# Patient Record
Sex: Female | Born: 1945
Health system: Southern US, Community
[De-identification: ages and names within clinical notes are randomized; demographics above are authoritative.]

## PROBLEM LIST (undated history)

## (undated) DIAGNOSIS — Z8041 Family history of malignant neoplasm of ovary: Secondary | ICD-10-CM

## (undated) DIAGNOSIS — Z9109 Other allergy status, other than to drugs and biological substances: Secondary | ICD-10-CM

## (undated) DIAGNOSIS — K219 Gastro-esophageal reflux disease without esophagitis: Secondary | ICD-10-CM

## (undated) DIAGNOSIS — I639 Cerebral infarction, unspecified: Secondary | ICD-10-CM

## (undated) DIAGNOSIS — J302 Other seasonal allergic rhinitis: Secondary | ICD-10-CM

## (undated) DIAGNOSIS — J45909 Unspecified asthma, uncomplicated: Secondary | ICD-10-CM

## (undated) DIAGNOSIS — R531 Weakness: Secondary | ICD-10-CM

## (undated) DIAGNOSIS — M1990 Unspecified osteoarthritis, unspecified site: Secondary | ICD-10-CM

## (undated) DIAGNOSIS — Z803 Family history of malignant neoplasm of breast: Secondary | ICD-10-CM

## (undated) DIAGNOSIS — R011 Cardiac murmur, unspecified: Secondary | ICD-10-CM

## (undated) DIAGNOSIS — S8990XA Unspecified injury of unspecified lower leg, initial encounter: Secondary | ICD-10-CM

## (undated) DIAGNOSIS — IMO0002 Reserved for concepts with insufficient information to code with codable children: Secondary | ICD-10-CM

## (undated) DIAGNOSIS — K259 Gastric ulcer, unspecified as acute or chronic, without hemorrhage or perforation: Secondary | ICD-10-CM

## (undated) DIAGNOSIS — I10 Essential (primary) hypertension: Secondary | ICD-10-CM

## (undated) DIAGNOSIS — H269 Unspecified cataract: Secondary | ICD-10-CM

## (undated) DIAGNOSIS — Z923 Personal history of irradiation: Secondary | ICD-10-CM

## (undated) DIAGNOSIS — G43909 Migraine, unspecified, not intractable, without status migrainosus: Secondary | ICD-10-CM

## (undated) DIAGNOSIS — F32A Depression, unspecified: Secondary | ICD-10-CM

## (undated) DIAGNOSIS — A809 Acute poliomyelitis, unspecified: Secondary | ICD-10-CM

## (undated) DIAGNOSIS — J189 Pneumonia, unspecified organism: Secondary | ICD-10-CM

## (undated) DIAGNOSIS — C50919 Malignant neoplasm of unspecified site of unspecified female breast: Secondary | ICD-10-CM

## (undated) DIAGNOSIS — F329 Major depressive disorder, single episode, unspecified: Secondary | ICD-10-CM

## (undated) DIAGNOSIS — E669 Obesity, unspecified: Secondary | ICD-10-CM

## (undated) DIAGNOSIS — J3089 Other allergic rhinitis: Secondary | ICD-10-CM

## (undated) DIAGNOSIS — E785 Hyperlipidemia, unspecified: Secondary | ICD-10-CM

## (undated) DIAGNOSIS — D649 Anemia, unspecified: Secondary | ICD-10-CM

## (undated) HISTORY — DX: Other allergic rhinitis: J30.89

## (undated) HISTORY — DX: Acute poliomyelitis, unspecified: A80.9

## (undated) HISTORY — PX: UPPER GASTROINTESTINAL ENDOSCOPY: SHX188

## (undated) HISTORY — PX: TONSILLECTOMY: SUR1361

## (undated) HISTORY — DX: Unspecified injury of unspecified lower leg, initial encounter: S89.90XA

## (undated) HISTORY — DX: Unspecified cataract: H26.9

## (undated) HISTORY — DX: Migraine, unspecified, not intractable, without status migrainosus: G43.909

## (undated) HISTORY — DX: Obesity, unspecified: E66.9

## (undated) HISTORY — DX: Hyperlipidemia, unspecified: E78.5

## (undated) HISTORY — DX: Major depressive disorder, single episode, unspecified: F32.9

## (undated) HISTORY — DX: Cerebral infarction, unspecified: I63.9

## (undated) HISTORY — PX: BREAST BIOPSY: SHX20

## (undated) HISTORY — DX: Malignant neoplasm of unspecified site of unspecified female breast: C50.919

## (undated) HISTORY — PX: WISDOM TOOTH EXTRACTION: SHX21

## (undated) HISTORY — DX: Reserved for concepts with insufficient information to code with codable children: IMO0002

## (undated) HISTORY — DX: Gastro-esophageal reflux disease without esophagitis: K21.9

## (undated) HISTORY — PX: SKIN CANCER DESTRUCTION: SHX778

## (undated) HISTORY — DX: Anemia, unspecified: D64.9

## (undated) HISTORY — DX: Gastric ulcer, unspecified as acute or chronic, without hemorrhage or perforation: K25.9

## (undated) HISTORY — DX: Depression, unspecified: F32.A

## (undated) HISTORY — DX: Family history of malignant neoplasm of ovary: Z80.41

## (undated) HISTORY — DX: Family history of malignant neoplasm of breast: Z80.3

## (undated) HISTORY — PX: CATARACT EXTRACTION, BILATERAL: SHX1313

## (undated) HISTORY — PX: COLONOSCOPY: SHX174

## (undated) HISTORY — DX: Other seasonal allergic rhinitis: J30.2

---

## 1974-01-07 HISTORY — PX: TUBAL LIGATION: SHX77

## 1997-08-19 ENCOUNTER — Ambulatory Visit (HOSPITAL_COMMUNITY): Admission: RE | Admit: 1997-08-19 | Discharge: 1997-08-19 | Payer: Self-pay | Admitting: Family Medicine

## 1998-05-18 ENCOUNTER — Other Ambulatory Visit: Admission: RE | Admit: 1998-05-18 | Discharge: 1998-05-18 | Payer: Self-pay | Admitting: Obstetrics and Gynecology

## 1998-08-22 ENCOUNTER — Ambulatory Visit (HOSPITAL_COMMUNITY): Admission: RE | Admit: 1998-08-22 | Discharge: 1998-08-22 | Payer: Self-pay | Admitting: Family Medicine

## 1998-08-22 ENCOUNTER — Encounter: Payer: Self-pay | Admitting: Family Medicine

## 1999-07-10 ENCOUNTER — Other Ambulatory Visit: Admission: RE | Admit: 1999-07-10 | Discharge: 1999-07-10 | Payer: Self-pay | Admitting: Obstetrics and Gynecology

## 2000-09-18 ENCOUNTER — Other Ambulatory Visit: Admission: RE | Admit: 2000-09-18 | Discharge: 2000-09-18 | Payer: Self-pay | Admitting: Obstetrics and Gynecology

## 2000-09-19 ENCOUNTER — Encounter: Payer: Self-pay | Admitting: Family Medicine

## 2000-09-19 ENCOUNTER — Ambulatory Visit (HOSPITAL_COMMUNITY): Admission: RE | Admit: 2000-09-19 | Discharge: 2000-09-19 | Payer: Self-pay | Admitting: Family Medicine

## 2001-06-04 ENCOUNTER — Encounter: Payer: Self-pay | Admitting: Family Medicine

## 2001-06-04 ENCOUNTER — Ambulatory Visit (HOSPITAL_COMMUNITY): Admission: RE | Admit: 2001-06-04 | Discharge: 2001-06-04 | Payer: Self-pay | Admitting: Family Medicine

## 2001-07-06 ENCOUNTER — Encounter: Admission: RE | Admit: 2001-07-06 | Discharge: 2001-07-06 | Payer: Self-pay | Admitting: Family Medicine

## 2001-07-06 ENCOUNTER — Encounter: Payer: Self-pay | Admitting: Family Medicine

## 2001-11-12 ENCOUNTER — Other Ambulatory Visit: Admission: RE | Admit: 2001-11-12 | Discharge: 2001-11-12 | Payer: Self-pay | Admitting: Obstetrics and Gynecology

## 2002-04-14 ENCOUNTER — Ambulatory Visit (HOSPITAL_COMMUNITY): Admission: RE | Admit: 2002-04-14 | Discharge: 2002-04-14 | Payer: Self-pay | Admitting: Family Medicine

## 2002-04-14 ENCOUNTER — Encounter: Payer: Self-pay | Admitting: Family Medicine

## 2002-12-30 ENCOUNTER — Other Ambulatory Visit: Admission: RE | Admit: 2002-12-30 | Discharge: 2002-12-30 | Payer: Self-pay | Admitting: Obstetrics and Gynecology

## 2003-01-08 HISTORY — PX: NISSEN FUNDOPLICATION: SHX2091

## 2003-01-11 ENCOUNTER — Ambulatory Visit (HOSPITAL_COMMUNITY): Admission: RE | Admit: 2003-01-11 | Discharge: 2003-01-11 | Payer: Self-pay | Admitting: Internal Medicine

## 2003-03-29 ENCOUNTER — Ambulatory Visit (HOSPITAL_COMMUNITY): Admission: RE | Admit: 2003-03-29 | Discharge: 2003-03-29 | Payer: Self-pay | Admitting: Gastroenterology

## 2003-06-28 ENCOUNTER — Inpatient Hospital Stay (HOSPITAL_COMMUNITY): Admission: RE | Admit: 2003-06-28 | Discharge: 2003-07-01 | Payer: Self-pay | Admitting: Surgery

## 2003-08-18 ENCOUNTER — Ambulatory Visit (HOSPITAL_COMMUNITY): Admission: RE | Admit: 2003-08-18 | Discharge: 2003-08-18 | Payer: Self-pay | Admitting: Family Medicine

## 2004-02-10 ENCOUNTER — Ambulatory Visit: Payer: Self-pay | Admitting: Gastroenterology

## 2004-02-20 ENCOUNTER — Other Ambulatory Visit: Admission: RE | Admit: 2004-02-20 | Discharge: 2004-02-20 | Payer: Self-pay | Admitting: Obstetrics and Gynecology

## 2004-02-24 ENCOUNTER — Ambulatory Visit: Payer: Self-pay | Admitting: Gastroenterology

## 2004-08-21 ENCOUNTER — Ambulatory Visit (HOSPITAL_COMMUNITY): Admission: RE | Admit: 2004-08-21 | Discharge: 2004-08-21 | Payer: Self-pay | Admitting: Obstetrics and Gynecology

## 2005-08-22 ENCOUNTER — Ambulatory Visit (HOSPITAL_COMMUNITY): Admission: RE | Admit: 2005-08-22 | Discharge: 2005-08-22 | Payer: Self-pay | Admitting: Internal Medicine

## 2005-09-12 ENCOUNTER — Other Ambulatory Visit: Admission: RE | Admit: 2005-09-12 | Discharge: 2005-09-12 | Payer: Self-pay | Admitting: Obstetrics & Gynecology

## 2005-11-13 ENCOUNTER — Ambulatory Visit: Payer: Self-pay | Admitting: Internal Medicine

## 2006-08-25 ENCOUNTER — Ambulatory Visit (HOSPITAL_COMMUNITY): Admission: RE | Admit: 2006-08-25 | Discharge: 2006-08-25 | Payer: Self-pay | Admitting: Internal Medicine

## 2006-09-19 ENCOUNTER — Other Ambulatory Visit: Admission: RE | Admit: 2006-09-19 | Discharge: 2006-09-19 | Payer: Self-pay | Admitting: Obstetrics and Gynecology

## 2007-01-08 HISTORY — PX: BREAST LUMPECTOMY: SHX2

## 2007-08-26 ENCOUNTER — Ambulatory Visit (HOSPITAL_COMMUNITY): Admission: RE | Admit: 2007-08-26 | Discharge: 2007-08-26 | Payer: Self-pay | Admitting: Internal Medicine

## 2007-09-03 ENCOUNTER — Encounter (INDEPENDENT_AMBULATORY_CARE_PROVIDER_SITE_OTHER): Payer: Self-pay | Admitting: Diagnostic Radiology

## 2007-09-03 ENCOUNTER — Encounter: Admission: RE | Admit: 2007-09-03 | Discharge: 2007-09-03 | Payer: Self-pay | Admitting: Internal Medicine

## 2007-09-08 ENCOUNTER — Encounter: Admission: RE | Admit: 2007-09-08 | Discharge: 2007-09-08 | Payer: Self-pay | Admitting: Internal Medicine

## 2007-09-15 ENCOUNTER — Encounter: Admission: RE | Admit: 2007-09-15 | Discharge: 2007-09-15 | Payer: Self-pay | Admitting: Surgery

## 2007-09-17 ENCOUNTER — Encounter: Admission: RE | Admit: 2007-09-17 | Discharge: 2007-09-17 | Payer: Self-pay | Admitting: Surgery

## 2007-09-17 ENCOUNTER — Encounter (INDEPENDENT_AMBULATORY_CARE_PROVIDER_SITE_OTHER): Payer: Self-pay | Admitting: Surgery

## 2007-09-17 ENCOUNTER — Ambulatory Visit (HOSPITAL_BASED_OUTPATIENT_CLINIC_OR_DEPARTMENT_OTHER): Admission: RE | Admit: 2007-09-17 | Discharge: 2007-09-17 | Payer: Self-pay | Admitting: Surgery

## 2007-09-22 ENCOUNTER — Ambulatory Visit: Payer: Self-pay | Admitting: Oncology

## 2007-09-30 LAB — CBC WITH DIFFERENTIAL/PLATELET
Basophils Absolute: 0.1 10*3/uL (ref 0.0–0.1)
EOS%: 10.6 % — ABNORMAL HIGH (ref 0.0–7.0)
Eosinophils Absolute: 0.8 10*3/uL — ABNORMAL HIGH (ref 0.0–0.5)
HCT: 36.4 % (ref 34.8–46.6)
HGB: 12.6 g/dL (ref 11.6–15.9)
MCH: 32.9 pg (ref 26.0–34.0)
MCV: 95 fL (ref 81.0–101.0)
MONO%: 5 % (ref 0.0–13.0)
NEUT#: 4.4 10*3/uL (ref 1.5–6.5)
NEUT%: 56.7 % (ref 39.6–76.8)
RDW: 14.1 % (ref 11.3–14.5)
lymph#: 2.1 10*3/uL (ref 0.9–3.3)

## 2007-10-01 LAB — COMPREHENSIVE METABOLIC PANEL
Albumin: 4.5 g/dL (ref 3.5–5.2)
BUN: 22 mg/dL (ref 6–23)
Calcium: 9.5 mg/dL (ref 8.4–10.5)
Chloride: 106 mEq/L (ref 96–112)
Creatinine, Ser: 0.82 mg/dL (ref 0.40–1.20)
Glucose, Bld: 108 mg/dL — ABNORMAL HIGH (ref 70–99)
Potassium: 4.4 mEq/L (ref 3.5–5.3)

## 2007-10-01 LAB — CANCER ANTIGEN 27.29: CA 27.29: 28 U/mL (ref 0–39)

## 2007-10-12 ENCOUNTER — Ambulatory Visit (HOSPITAL_COMMUNITY): Admission: RE | Admit: 2007-10-12 | Discharge: 2007-10-12 | Payer: Self-pay | Admitting: Oncology

## 2007-10-14 ENCOUNTER — Ambulatory Visit: Admission: RE | Admit: 2007-10-14 | Discharge: 2007-12-08 | Payer: Self-pay | Admitting: Radiation Oncology

## 2007-12-28 ENCOUNTER — Ambulatory Visit: Payer: Self-pay | Admitting: Oncology

## 2008-01-27 ENCOUNTER — Other Ambulatory Visit: Admission: RE | Admit: 2008-01-27 | Discharge: 2008-01-27 | Payer: Self-pay | Admitting: Obstetrics & Gynecology

## 2008-03-22 ENCOUNTER — Encounter: Admission: RE | Admit: 2008-03-22 | Discharge: 2008-03-22 | Payer: Self-pay | Admitting: Oncology

## 2008-04-12 ENCOUNTER — Ambulatory Visit: Payer: Self-pay | Admitting: Oncology

## 2008-04-14 LAB — CBC WITH DIFFERENTIAL/PLATELET
BASO%: 0.5 % (ref 0.0–2.0)
Basophils Absolute: 0 10*3/uL (ref 0.0–0.1)
EOS%: 10.1 % — ABNORMAL HIGH (ref 0.0–7.0)
HCT: 32.7 % — ABNORMAL LOW (ref 34.8–46.6)
LYMPH%: 28.5 % (ref 14.0–49.7)
MCH: 31.2 pg (ref 25.1–34.0)
MCHC: 33.9 g/dL (ref 31.5–36.0)
MCV: 91.9 fL (ref 79.5–101.0)
MONO%: 5.3 % (ref 0.0–14.0)
NEUT%: 55.6 % (ref 38.4–76.8)
lymph#: 1.6 10*3/uL (ref 0.9–3.3)

## 2008-04-14 LAB — COMPREHENSIVE METABOLIC PANEL
ALT: 20 U/L (ref 0–35)
AST: 22 U/L (ref 0–37)
BUN: 17 mg/dL (ref 6–23)
Calcium: 9 mg/dL (ref 8.4–10.5)
Creatinine, Ser: 0.81 mg/dL (ref 0.40–1.20)
Total Bilirubin: 0.4 mg/dL (ref 0.3–1.2)

## 2008-07-22 ENCOUNTER — Encounter: Payer: Self-pay | Admitting: Internal Medicine

## 2008-08-15 ENCOUNTER — Encounter: Payer: Self-pay | Admitting: Internal Medicine

## 2008-08-22 ENCOUNTER — Encounter: Payer: Self-pay | Admitting: Internal Medicine

## 2008-08-24 DIAGNOSIS — F329 Major depressive disorder, single episode, unspecified: Secondary | ICD-10-CM

## 2008-08-24 DIAGNOSIS — Z8679 Personal history of other diseases of the circulatory system: Secondary | ICD-10-CM | POA: Insufficient documentation

## 2008-08-24 DIAGNOSIS — K219 Gastro-esophageal reflux disease without esophagitis: Secondary | ICD-10-CM | POA: Insufficient documentation

## 2008-08-24 DIAGNOSIS — Q782 Osteopetrosis: Secondary | ICD-10-CM

## 2008-08-24 DIAGNOSIS — F3289 Other specified depressive episodes: Secondary | ICD-10-CM | POA: Insufficient documentation

## 2008-08-30 ENCOUNTER — Ambulatory Visit: Payer: Self-pay | Admitting: Internal Medicine

## 2008-08-30 DIAGNOSIS — R011 Cardiac murmur, unspecified: Secondary | ICD-10-CM

## 2008-08-30 DIAGNOSIS — R0602 Shortness of breath: Secondary | ICD-10-CM

## 2008-08-30 DIAGNOSIS — R9431 Abnormal electrocardiogram [ECG] [EKG]: Secondary | ICD-10-CM

## 2008-08-30 DIAGNOSIS — R002 Palpitations: Secondary | ICD-10-CM

## 2008-09-06 ENCOUNTER — Encounter: Admission: RE | Admit: 2008-09-06 | Discharge: 2008-09-06 | Payer: Self-pay | Admitting: Oncology

## 2008-09-06 ENCOUNTER — Telehealth (INDEPENDENT_AMBULATORY_CARE_PROVIDER_SITE_OTHER): Payer: Self-pay | Admitting: *Deleted

## 2008-09-07 ENCOUNTER — Encounter: Payer: Self-pay | Admitting: Cardiology

## 2008-09-07 ENCOUNTER — Ambulatory Visit: Payer: Self-pay

## 2008-09-07 ENCOUNTER — Encounter: Payer: Self-pay | Admitting: Internal Medicine

## 2008-09-15 ENCOUNTER — Ambulatory Visit: Payer: Self-pay | Admitting: Internal Medicine

## 2008-09-18 DIAGNOSIS — J309 Allergic rhinitis, unspecified: Secondary | ICD-10-CM | POA: Insufficient documentation

## 2008-09-20 ENCOUNTER — Encounter: Payer: Self-pay | Admitting: Internal Medicine

## 2008-09-27 ENCOUNTER — Encounter: Payer: Self-pay | Admitting: Internal Medicine

## 2008-10-05 ENCOUNTER — Ambulatory Visit: Payer: Self-pay | Admitting: Oncology

## 2008-10-07 LAB — CBC WITH DIFFERENTIAL/PLATELET
BASO%: 1.1 % (ref 0.0–2.0)
Basophils Absolute: 0.1 10*3/uL (ref 0.0–0.1)
EOS%: 16.8 % — ABNORMAL HIGH (ref 0.0–7.0)
HCT: 35.6 % (ref 34.8–46.6)
HGB: 12.4 g/dL (ref 11.6–15.9)
LYMPH%: 29.2 % (ref 14.0–49.7)
MCH: 34.5 pg — ABNORMAL HIGH (ref 25.1–34.0)
MCHC: 34.8 g/dL (ref 31.5–36.0)
MONO#: 0.3 10*3/uL (ref 0.1–0.9)
NEUT%: 47.4 % (ref 38.4–76.8)
Platelets: 310 10*3/uL (ref 145–400)
lymph#: 1.7 10*3/uL (ref 0.9–3.3)

## 2008-10-08 LAB — COMPREHENSIVE METABOLIC PANEL
AST: 18 U/L (ref 0–37)
Albumin: 4.1 g/dL (ref 3.5–5.2)
BUN: 22 mg/dL (ref 6–23)
Calcium: 9.5 mg/dL (ref 8.4–10.5)
Chloride: 104 mEq/L (ref 96–112)
Glucose, Bld: 92 mg/dL (ref 70–99)
Potassium: 3.9 mEq/L (ref 3.5–5.3)
Sodium: 139 mEq/L (ref 135–145)
Total Protein: 6.8 g/dL (ref 6.0–8.3)

## 2008-10-08 LAB — VITAMIN D 25 HYDROXY (VIT D DEFICIENCY, FRACTURES): Vit D, 25-Hydroxy: 29 ng/mL — ABNORMAL LOW (ref 30–89)

## 2008-11-15 DIAGNOSIS — F3342 Major depressive disorder, recurrent, in full remission: Secondary | ICD-10-CM | POA: Insufficient documentation

## 2008-11-15 DIAGNOSIS — M810 Age-related osteoporosis without current pathological fracture: Secondary | ICD-10-CM | POA: Insufficient documentation

## 2009-04-10 ENCOUNTER — Ambulatory Visit: Payer: Self-pay | Admitting: Oncology

## 2009-04-12 LAB — CBC WITH DIFFERENTIAL/PLATELET
Basophils Absolute: 0 10*3/uL (ref 0.0–0.1)
EOS%: 4.6 % (ref 0.0–7.0)
HGB: 11.6 g/dL (ref 11.6–15.9)
LYMPH%: 29.7 % (ref 14.0–49.7)
MCH: 34.1 pg — ABNORMAL HIGH (ref 25.1–34.0)
MCHC: 34.7 g/dL (ref 31.5–36.0)
MONO#: 0.2 10*3/uL (ref 0.1–0.9)
NEUT#: 3.5 10*3/uL (ref 1.5–6.5)
NEUT%: 60.8 % (ref 38.4–76.8)
Platelets: 298 10*3/uL (ref 145–400)
RBC: 3.39 10*6/uL — ABNORMAL LOW (ref 3.70–5.45)
WBC: 5.8 10*3/uL (ref 3.9–10.3)
lymph#: 1.7 10*3/uL (ref 0.9–3.3)

## 2009-04-12 LAB — COMPREHENSIVE METABOLIC PANEL
Albumin: 4 g/dL (ref 3.5–5.2)
Calcium: 8.9 mg/dL (ref 8.4–10.5)
Creatinine, Ser: 0.8 mg/dL (ref 0.40–1.20)
Glucose, Bld: 149 mg/dL — ABNORMAL HIGH (ref 70–99)

## 2009-04-12 LAB — VITAMIN D 25 HYDROXY (VIT D DEFICIENCY, FRACTURES): Vit D, 25-Hydroxy: 35 ng/mL (ref 30–89)

## 2009-04-12 LAB — LACTATE DEHYDROGENASE: LDH: 124 U/L (ref 94–250)

## 2009-04-20 ENCOUNTER — Encounter: Admission: RE | Admit: 2009-04-20 | Discharge: 2009-04-20 | Payer: Self-pay | Admitting: Oncology

## 2009-09-07 ENCOUNTER — Encounter: Admission: RE | Admit: 2009-09-07 | Discharge: 2009-09-07 | Payer: Self-pay | Admitting: Internal Medicine

## 2009-09-21 DIAGNOSIS — I10 Essential (primary) hypertension: Secondary | ICD-10-CM | POA: Insufficient documentation

## 2009-10-30 ENCOUNTER — Ambulatory Visit: Payer: Self-pay | Admitting: Oncology

## 2009-11-01 LAB — CBC WITH DIFFERENTIAL/PLATELET
BASO%: 0.6 % (ref 0.0–2.0)
Basophils Absolute: 0 10*3/uL (ref 0.0–0.1)
Eosinophils Absolute: 0.5 10*3/uL (ref 0.0–0.5)
HCT: 36.7 % (ref 34.8–46.6)
MCHC: 34.7 g/dL (ref 31.5–36.0)
MCV: 99.3 fL (ref 79.5–101.0)
MONO#: 0.4 10*3/uL (ref 0.1–0.9)
MONO%: 6.6 % (ref 0.0–14.0)
NEUT#: 4 10*3/uL (ref 1.5–6.5)
NEUT%: 59.6 % (ref 38.4–76.8)
Platelets: 370 10*3/uL (ref 145–400)
RDW: 13.3 % (ref 11.2–14.5)

## 2009-11-02 LAB — COMPREHENSIVE METABOLIC PANEL
AST: 21 U/L (ref 0–37)
Chloride: 102 mEq/L (ref 96–112)
Sodium: 139 mEq/L (ref 135–145)

## 2009-11-02 LAB — VITAMIN D 25 HYDROXY (VIT D DEFICIENCY, FRACTURES): Vit D, 25-Hydroxy: 52 ng/mL (ref 30–89)

## 2009-11-02 LAB — CANCER ANTIGEN 27.29: CA 27.29: 26 U/mL (ref 0–39)

## 2010-05-10 ENCOUNTER — Encounter (INDEPENDENT_AMBULATORY_CARE_PROVIDER_SITE_OTHER): Payer: Self-pay | Admitting: Surgery

## 2010-05-22 ENCOUNTER — Other Ambulatory Visit: Payer: Self-pay | Admitting: Oncology

## 2010-05-22 ENCOUNTER — Encounter (HOSPITAL_BASED_OUTPATIENT_CLINIC_OR_DEPARTMENT_OTHER): Payer: Medicare Other | Admitting: Oncology

## 2010-05-22 DIAGNOSIS — Z170 Estrogen receptor positive status [ER+]: Secondary | ICD-10-CM

## 2010-05-22 DIAGNOSIS — C50919 Malignant neoplasm of unspecified site of unspecified female breast: Secondary | ICD-10-CM

## 2010-05-22 LAB — COMPREHENSIVE METABOLIC PANEL
AST: 22 U/L (ref 0–37)
Albumin: 4.4 g/dL (ref 3.5–5.2)
Alkaline Phosphatase: 102 U/L (ref 39–117)
Potassium: 3.9 mEq/L (ref 3.5–5.3)
Sodium: 141 mEq/L (ref 135–145)
Total Bilirubin: 0.3 mg/dL (ref 0.3–1.2)
Total Protein: 6.9 g/dL (ref 6.0–8.3)

## 2010-05-22 LAB — CBC WITH DIFFERENTIAL/PLATELET
Basophils Absolute: 0 10*3/uL (ref 0.0–0.1)
Eosinophils Absolute: 0.5 10*3/uL (ref 0.0–0.5)
HGB: 12.8 g/dL (ref 11.6–15.9)
MONO#: 0.2 10*3/uL (ref 0.1–0.9)
NEUT#: 2.6 10*3/uL (ref 1.5–6.5)
RBC: 3.72 10*6/uL (ref 3.70–5.45)
RDW: 13.5 % (ref 11.2–14.5)
WBC: 4.7 10*3/uL (ref 3.9–10.3)
lymph#: 1.3 10*3/uL (ref 0.9–3.3)

## 2010-05-22 NOTE — Op Note (Signed)
Kelsey Mueller, STOPHER                ACCOUNT NO.:  [id number]   MEDICAL RECORD NO.:  [id number]          PATIENT TYPE:  AMB   LOCATION:  DSC                          FACILITY:  [facility]   PHYSICIAN:  Kelsey Heckler, MD      DATE OF BIRTH:  10-Jul-1945   DATE OF PROCEDURE:  09/17/2007  DATE OF DISCHARGE:                               OPERATIVE REPORT   PREOPERATIVE DIAGNOSIS:  Right breast carcinoma.   POSTOPERATIVE DIAGNOSIS:  Right breast carcinoma.   PROCEDURES:  1. Right partial mastectomy (lumpectomy) with wire localization.  2. Right axillary sentinel lymph node biopsy.  3. Blue dye injection, right breast.   SURGEON:  Kelsey Heckler, MD, FACS   ANESTHESIA:  General.   ESTIMATED BLOOD LOSS:  Minimal.   PREPARATION:  Betadine.   COMPLICATIONS:  None.   INDICATIONS:  The patient is a 65 year old white female from [place],  [place].  Screening mammogram in August 2009 showed an  abnormality in the medial right breast.  Bilateral mammography with  compression views and ultrasound at the [facility]  showed a suspicious lesion in the medial breast.  Ultrasound-guided core  needle biopsy was performed and showed invasive mammary carcinoma.  MRI  scan showed a solitary lesion in the right medial breast without other  evidence of malignancy.  The patient now comes to surgery for resection.   BODY OF REPORT:  Procedure was done in OR #8 at the [facility].  The patient was brought to the operating room, placed in a  supine position on the operating room table.  Following the  administration of general anesthesia, the patient was positioned and  then prepped and draped in the usual strict aseptic fashion.  After  ascertaining that an adequate level of anesthesia had been achieved, the  right breast was injected with methylene blue dye.  This was massaged  for 5 minutes.  Using the NeoProbe, an area of increased radioactivity  was noted in the  low axilla on the right.  This was marked on the skin.   Using a #15 blade, an incision was made in the low axilla on the right  overlying the area of increased radioactivity.  Dissection was carried  down to the subcutaneous tissues and the hemostasis was obtained with  the electrocautery.  Using the NeoProbe for guidance, an enlarged blue  lymph node containing increased radioactive counts mobilized and excised  with surrounding adipose tissue.  Left node was dissected out of the  tissue and submitted and labeled as sentinel lymph node #1.  Additional  tissue was submitted as additional axillary content.  Rescanning of the  axilla with the NeoProbe showed a second area of increased radioactivity  just cephalad to the initial lymph node excision.  Exploration revealed  a normal-size lymph node, which was slightly blue in color.  It was  resected.  It was submitted as sentinel lymph node #2.  Repeat scanning  of the axilla showed no elevated radioactive counts above background  levels.  Dry pack was placed in the  axilla.   Next, incision was made at the site of guidewire insertion in the right  medial breast.  Dissection was carried into the subcutaneous tissues and  hemostasis was obtained with the electrocautery.  Using [facility] retractors,  skin flaps were developed circumferentially.  A wide excision of adipose  and breast tissue was taken around the tip of the guidewire.  Dissection  was carried down to the chest wall.  The specimen was completely excised  using the electrocautery for hemostasis.  Using marking paints, the  margins were marked superiorly, inferiorly, mediolaterally, anteriorly,  and posteriorly and then the specimen was submitted for Faxitron  followed by pathologic evaluation.  Faxitron mammographic views were  reviewed by the radiologist and confirmed that the lesion in question  was fully excised.  Pathologic evaluation will await permanent sections.   Good  hemostasis was obtained in both wounds.  Pathology report on  sentinel lymph nodes by Dr. Jimmy Mueller was negative for both sentinel  lymph nodes submitted.  Subcutaneous tissues were closed with  interrupted 3-0 Vicryl sutures.  Skin was closed with a running 4-0  Monocryl subcuticular suture on both incisions.  Wounds were washed and  dried.  Local anesthetic was injected circumferentially.  Benzoin and  Steri-Strips were applied.  Dressings were applied.  The patient was  awakened from anesthesia and brought to the recovery room in stable  condition.  The patient tolerated the procedure well.      Kelsey Heckler, MD  Electronically Signed     TMG/MEDQ  D:  09/17/2007  T:  09/18/2007  Job:  [id number]   cc:   Kari Baars, M.D.  Daryl Eastern, M.D.

## 2010-05-23 ENCOUNTER — Encounter: Payer: Medicare Other | Admitting: Oncology

## 2010-05-23 ENCOUNTER — Other Ambulatory Visit: Payer: Self-pay | Admitting: Oncology

## 2010-05-23 DIAGNOSIS — N63 Unspecified lump in unspecified breast: Secondary | ICD-10-CM

## 2010-05-23 DIAGNOSIS — Z9889 Other specified postprocedural states: Secondary | ICD-10-CM

## 2010-05-25 NOTE — Discharge Summary (Signed)
NAME:  BELVIA, GOTSCHALL                          ACCOUNT NO.:  [id number]   MEDICAL RECORD NO.:  [id number]                   PATIENT TYPE:  INP   LOCATION:  0981                                 FACILITY:  [facility]   PHYSICIAN:  Velora Heckler, M.D.                DATE OF BIRTH:  Aug 29, 1945   DATE OF ADMISSION:  06/28/2003  DATE OF DISCHARGE:  07/01/2003                                 DISCHARGE SUMMARY   REASON FOR ADMISSION:  Gastroesophageal reflux disease, hiatal hernia.   HISTORY OF PRESENT ILLNESS:  Patient is a 65 year old white female referred  to my practice by Dr. Melvia Heaps for hiatal hernia and gastroesophageal  reflux.  She underwent a thorough workup by Dr. Arlyce Dice in preparation for  surgical repair.  She now comes to surgery for fundoplication.   HOSPITAL COURSE:  Patient was admitted on June 28, 2003.  She was taken  directly to the operating room, where she underwent laparoscopic Nissen  fundoplication and repair of a large hiatal hernia defect.  Postoperatively,  the patient did well.  On the first postoperative day, she began a clear-  liquid diet.  She tolerated this fine.  She did have some subcutaneous  emphysema, which was mildly symptomatic.   The patient was advanced on the second postoperative day to a full-liquid  diet.  She became ambulation.  She had return of bowel function.  She was  prepared for discharge home on the third postoperative day.   DISCHARGE PLANNING:  Patient is discharged home on July 01, 2003 in good  condition, tolerating a soft mechanical diet and ambulating independently.  She will be seen back in my office at [facility] in two weeks.  Discharge medications include Vicodin as needed for pain and Phenergan as  needed for nausea.  Patient will continue taking her own Prilosec once  daily.   FINAL DIAGNOSIS:  Gastroesophageal reflux disease.  Large hiatal hernia.   CONDITION ON DISCHARGE:  Improved.                                Velora Heckler, M.D.    TMG/MEDQ  D:  07/01/2003  T:  07/01/2003  Job:  [id number]   cc:   Barbette Hair. Arlyce Dice, M.D. [facility]   Fayrene Fearing D. Artis Flock, M.D.  [address], [address]  [place]  [place] [address]  Fax: [phone]

## 2010-07-04 ENCOUNTER — Ambulatory Visit: Payer: Medicare Other | Attending: Obstetrics & Gynecology | Admitting: Physical Therapy

## 2010-07-04 DIAGNOSIS — M25519 Pain in unspecified shoulder: Secondary | ICD-10-CM | POA: Insufficient documentation

## 2010-07-04 DIAGNOSIS — IMO0001 Reserved for inherently not codable concepts without codable children: Secondary | ICD-10-CM | POA: Insufficient documentation

## 2010-07-04 DIAGNOSIS — M25619 Stiffness of unspecified shoulder, not elsewhere classified: Secondary | ICD-10-CM | POA: Insufficient documentation

## 2010-07-13 ENCOUNTER — Encounter: Payer: Self-pay | Admitting: Internal Medicine

## 2010-07-18 ENCOUNTER — Ambulatory Visit: Payer: Medicare Other | Attending: Obstetrics & Gynecology | Admitting: Physical Therapy

## 2010-07-18 DIAGNOSIS — M25519 Pain in unspecified shoulder: Secondary | ICD-10-CM | POA: Insufficient documentation

## 2010-07-18 DIAGNOSIS — IMO0001 Reserved for inherently not codable concepts without codable children: Secondary | ICD-10-CM | POA: Insufficient documentation

## 2010-07-18 DIAGNOSIS — M25619 Stiffness of unspecified shoulder, not elsewhere classified: Secondary | ICD-10-CM | POA: Insufficient documentation

## 2010-07-20 ENCOUNTER — Encounter: Payer: Medicare Other | Admitting: Physical Therapy

## 2010-08-07 ENCOUNTER — Encounter: Payer: Medicare Other | Admitting: Physical Therapy

## 2010-08-09 ENCOUNTER — Ambulatory Visit: Payer: Medicare Other | Attending: Obstetrics & Gynecology | Admitting: Physical Therapy

## 2010-08-09 DIAGNOSIS — M25619 Stiffness of unspecified shoulder, not elsewhere classified: Secondary | ICD-10-CM | POA: Insufficient documentation

## 2010-08-09 DIAGNOSIS — IMO0001 Reserved for inherently not codable concepts without codable children: Secondary | ICD-10-CM | POA: Insufficient documentation

## 2010-08-09 DIAGNOSIS — M25519 Pain in unspecified shoulder: Secondary | ICD-10-CM | POA: Insufficient documentation

## 2010-08-21 ENCOUNTER — Ambulatory Visit: Payer: Medicare Other | Admitting: Physical Therapy

## 2010-08-23 ENCOUNTER — Encounter: Payer: Medicare Other | Admitting: Physical Therapy

## 2010-08-27 ENCOUNTER — Ambulatory Visit: Payer: Medicare Other | Admitting: Physical Therapy

## 2010-08-29 ENCOUNTER — Ambulatory Visit: Payer: Medicare Other | Admitting: Physical Therapy

## 2010-09-05 ENCOUNTER — Ambulatory Visit: Payer: Medicare Other | Admitting: Physical Therapy

## 2010-09-07 ENCOUNTER — Ambulatory Visit: Payer: Medicare Other | Admitting: Physical Therapy

## 2010-09-11 ENCOUNTER — Ambulatory Visit
Admission: RE | Admit: 2010-09-11 | Discharge: 2010-09-11 | Disposition: A | Discharge status: Home / Self Care | Payer: Medicare Other | Source: Ambulatory Visit | Attending: Oncology | Admitting: Oncology

## 2010-09-11 DIAGNOSIS — Z9889 Other specified postprocedural states: Secondary | ICD-10-CM

## 2010-09-14 ENCOUNTER — Encounter (INDEPENDENT_AMBULATORY_CARE_PROVIDER_SITE_OTHER): Payer: Self-pay | Admitting: Surgery

## 2010-09-17 ENCOUNTER — Ambulatory Visit: Payer: Medicare Other | Attending: Obstetrics & Gynecology | Admitting: Physical Therapy

## 2010-09-17 DIAGNOSIS — IMO0001 Reserved for inherently not codable concepts without codable children: Secondary | ICD-10-CM | POA: Insufficient documentation

## 2010-09-17 DIAGNOSIS — M25619 Stiffness of unspecified shoulder, not elsewhere classified: Secondary | ICD-10-CM | POA: Insufficient documentation

## 2010-09-17 DIAGNOSIS — M25519 Pain in unspecified shoulder: Secondary | ICD-10-CM | POA: Insufficient documentation

## 2010-09-19 ENCOUNTER — Encounter: Payer: Medicare Other | Admitting: Physical Therapy

## 2010-09-24 ENCOUNTER — Encounter: Payer: Medicare Other | Admitting: Physical Therapy

## 2010-09-26 ENCOUNTER — Encounter: Payer: Medicare Other | Admitting: Physical Therapy

## 2010-10-10 LAB — CBC
HCT: 34.8 — ABNORMAL LOW
Hemoglobin: 12
Platelets: 316
WBC: 6.4

## 2010-10-10 LAB — POCT HEMOGLOBIN-HEMACUE: Hemoglobin: 11.7 — ABNORMAL LOW

## 2010-10-10 LAB — DIFFERENTIAL
Basophils Relative: 1
Eosinophils Absolute: 1.1 — ABNORMAL HIGH
Eosinophils Relative: 17 — ABNORMAL HIGH
Lymphs Abs: 1.9
Monocytes Absolute: 0.3
Monocytes Relative: 4
Neutrophils Relative %: 47

## 2010-10-10 LAB — URINALYSIS, ROUTINE W REFLEX MICROSCOPIC
Bilirubin Urine: NEGATIVE
Hgb urine dipstick: NEGATIVE
Specific Gravity, Urine: 1.019
Urobilinogen, UA: 0.2
pH: 5.5

## 2010-10-10 LAB — CANCER ANTIGEN 27.29: CA 27.29: 23

## 2010-10-10 LAB — URINE MICROSCOPIC-ADD ON

## 2010-10-10 LAB — COMPREHENSIVE METABOLIC PANEL
ALT: 14
Albumin: 3.7
Alkaline Phosphatase: 95
BUN: 15
Chloride: 106
Glucose, Bld: 110 — ABNORMAL HIGH
Potassium: 3.9
Total Bilirubin: 0.7

## 2010-10-10 LAB — PROTIME-INR: Prothrombin Time: 13.3

## 2010-11-14 ENCOUNTER — Other Ambulatory Visit: Payer: Self-pay | Admitting: Oncology

## 2010-11-14 ENCOUNTER — Other Ambulatory Visit (HOSPITAL_BASED_OUTPATIENT_CLINIC_OR_DEPARTMENT_OTHER): Payer: Medicare Other | Admitting: Lab

## 2010-11-14 DIAGNOSIS — C50019 Malignant neoplasm of nipple and areola, unspecified female breast: Secondary | ICD-10-CM

## 2010-11-14 DIAGNOSIS — Z170 Estrogen receptor positive status [ER+]: Secondary | ICD-10-CM

## 2010-11-14 LAB — CBC WITH DIFFERENTIAL/PLATELET
BASO%: 0.8 % (ref 0.0–2.0)
EOS%: 11.6 % — ABNORMAL HIGH (ref 0.0–7.0)
HCT: 39 % (ref 34.8–46.6)
LYMPH%: 29.8 % (ref 14.0–49.7)
MCH: 34.1 pg — ABNORMAL HIGH (ref 25.1–34.0)
MCHC: 34.2 g/dL (ref 31.5–36.0)
NEUT%: 51.3 % (ref 38.4–76.8)
Platelets: 368 10*3/uL (ref 145–400)
RBC: 3.91 10*6/uL (ref 3.70–5.45)
lymph#: 1.6 10*3/uL (ref 0.9–3.3)

## 2010-11-14 LAB — COMPREHENSIVE METABOLIC PANEL
ALT: 18 U/L (ref 0–35)
AST: 19 U/L (ref 0–37)
Creatinine, Ser: 0.81 mg/dL (ref 0.50–1.10)
Sodium: 140 mEq/L (ref 135–145)
Total Bilirubin: 0.3 mg/dL (ref 0.3–1.2)

## 2010-11-21 ENCOUNTER — Ambulatory Visit (HOSPITAL_BASED_OUTPATIENT_CLINIC_OR_DEPARTMENT_OTHER): Payer: Medicare Other | Admitting: Oncology

## 2010-11-21 ENCOUNTER — Telehealth: Payer: Self-pay | Admitting: Oncology

## 2010-11-21 DIAGNOSIS — C50919 Malignant neoplasm of unspecified site of unspecified female breast: Secondary | ICD-10-CM

## 2010-11-21 DIAGNOSIS — Z170 Estrogen receptor positive status [ER+]: Secondary | ICD-10-CM

## 2010-11-21 DIAGNOSIS — E559 Vitamin D deficiency, unspecified: Secondary | ICD-10-CM

## 2010-11-21 DIAGNOSIS — C50019 Malignant neoplasm of nipple and areola, unspecified female breast: Secondary | ICD-10-CM

## 2010-11-21 NOTE — Progress Notes (Signed)
Hematology and Oncology Follow Up Visit  Kelsey Mueller [id number] June 23, 1945 65 y.o. 11/21/2010 4:13 PM   Principle Diagnosis: T1 C. N0 ER/PR positive breast cancer status post lumpectomy 09/17/2007, status post radiation therapy on tamoxifen for a year recently on Femara now on anastrozole.  Interim History:  Returns for followup she is doing well. Usual aches and pains in her shoulders. As you hot flashes. Appetite good and weight is stable.  Medications: I have reviewed the patient's current medications.  Allergies: Allergies no known allergies  Past Medical History, Surgical history, Social history, and Family History were reviewed and updated.  Review of Systems: Constitutional:  Negative for fever, chills, night sweats, anorexia, weight loss, pain. Cardiovascular: no chest pain or dyspnea on exertion Respiratory: no cough, shortness of breath, or wheezing Neurological: no TIA or stroke symptoms Dermatological: negative ENT: negative Skin Gastrointestinal: no abdominal pain, change in bowel habits, or black or bloody stools Genito-Urinary: no dysuria, trouble voiding, or hematuria Hematological and Lymphatic: negative Breast: negative for breast lumps Musculoskeletal: negative Remaining ROS negative.  Physical Exam: Blood pressure 149/90, pulse 88, temperature 98.3 F (36.8 C), temperature source Oral, height 5\' 2"  (1.575 m), weight 156 lb 3.2 oz (70.852 kg). ECOG: 0 General appearance: alert, cooperative and appears stated age Head: Normocephalic, without obvious abnormality, atraumatic Neck: no adenopathy, no carotid bruit, no JVD, supple, symmetrical, trachea midline and thyroid not enlarged, symmetric, no tenderness/mass/nodules Lymph nodes: Cervical, supraclavicular, and axillary nodes normal. Cardiac : Normal heart  Pulmonary: Normal breath sounds Breasts: Right breast status post lumpectomy, scar in the event, no evidence of local recurrence. Left breast and  axilla are. negative Abdomen: Normal Extremities normal Neuro: Normal  Lab Results: Lab Results  Component Value Date   WBC 5.3 11/14/2010   HGB 13.3 11/14/2010   HCT 39.0 11/14/2010   MCV 99.8 11/14/2010   PLT 368 11/14/2010     Chemistry      Component Value Date/Time   NA 140 11/14/2010 0741   NA 140 11/14/2010 0741   K 4.2 11/14/2010 0741   K 4.2 11/14/2010 0741   CL 104 11/14/2010 0741   CL 104 11/14/2010 0741   CO2 26 11/14/2010 0741   CO2 26 11/14/2010 0741   BUN 20 11/14/2010 0741   BUN 20 11/14/2010 0741   CREATININE 0.81 11/14/2010 0741   CREATININE 0.81 11/14/2010 0741      Component Value Date/Time   CALCIUM 9.9 11/14/2010 0741   CALCIUM 9.9 11/14/2010 0741   ALKPHOS 131* 11/14/2010 0741   ALKPHOS 131* 11/14/2010 0741   AST 19 11/14/2010 0741   AST 19 11/14/2010 0741   ALT 18 11/14/2010 0741   ALT 18 11/14/2010 0741   BILITOT 0.3 11/14/2010 0741   BILITOT 0.3 11/14/2010 0741       Radiological Studies: chest X-ray n/a Mammogram 9/12- nl Bone density n/a  Impression and Plan: Kelsey Mueller is doing well without evidence of recurrence. I will see her in 6 months for followup.  More than 50% of the visit was spent in patient-related counselling   Pierce Crane, MD 11/14/20124:13 PM

## 2010-11-21 NOTE — Telephone Encounter (Signed)
Gv pt appt for may2013 

## 2011-01-21 ENCOUNTER — Encounter: Payer: Self-pay | Admitting: Gastroenterology

## 2011-04-30 ENCOUNTER — Encounter: Payer: Self-pay | Admitting: Oncology

## 2011-05-21 ENCOUNTER — Other Ambulatory Visit: Payer: Medicare Other | Admitting: Lab

## 2011-05-28 ENCOUNTER — Ambulatory Visit: Payer: Medicare Other | Admitting: Oncology

## 2011-07-23 ENCOUNTER — Other Ambulatory Visit: Payer: Medicare Other | Admitting: Lab

## 2011-08-06 ENCOUNTER — Ambulatory Visit: Payer: Medicare Other | Admitting: Oncology

## 2011-08-12 ENCOUNTER — Other Ambulatory Visit: Payer: Self-pay | Admitting: Internal Medicine

## 2011-08-12 DIAGNOSIS — Z853 Personal history of malignant neoplasm of breast: Secondary | ICD-10-CM

## 2011-08-12 DIAGNOSIS — Z9889 Other specified postprocedural states: Secondary | ICD-10-CM

## 2011-08-21 ENCOUNTER — Other Ambulatory Visit: Payer: Medicare Other | Admitting: Lab

## 2011-08-28 ENCOUNTER — Other Ambulatory Visit (HOSPITAL_BASED_OUTPATIENT_CLINIC_OR_DEPARTMENT_OTHER): Payer: Medicare Other | Admitting: Lab

## 2011-08-28 DIAGNOSIS — C50919 Malignant neoplasm of unspecified site of unspecified female breast: Secondary | ICD-10-CM

## 2011-08-28 DIAGNOSIS — E559 Vitamin D deficiency, unspecified: Secondary | ICD-10-CM

## 2011-08-28 LAB — CBC WITH DIFFERENTIAL/PLATELET
Basophils Absolute: 0.1 10*3/uL (ref 0.0–0.1)
Eosinophils Absolute: 0.6 10*3/uL — ABNORMAL HIGH (ref 0.0–0.5)
HCT: 38.8 % (ref 34.8–46.6)
HGB: 13.2 g/dL (ref 11.6–15.9)
LYMPH%: 29.4 % (ref 14.0–49.7)
MCV: 101.1 fL — ABNORMAL HIGH (ref 79.5–101.0)
MONO%: 6.6 % (ref 0.0–14.0)
NEUT#: 3.2 10*3/uL (ref 1.5–6.5)
NEUT%: 52.5 % (ref 38.4–76.8)
Platelets: 299 10*3/uL (ref 145–400)

## 2011-08-29 LAB — COMPREHENSIVE METABOLIC PANEL
ALT: 18 U/L (ref 0–35)
AST: 20 U/L (ref 0–37)
BUN: 21 mg/dL (ref 6–23)
Calcium: 10.1 mg/dL (ref 8.4–10.5)
Glucose, Bld: 67 mg/dL — ABNORMAL LOW (ref 70–99)
Potassium: 4 mEq/L (ref 3.5–5.3)
Total Bilirubin: 0.4 mg/dL (ref 0.3–1.2)
Total Protein: 7.2 g/dL (ref 6.0–8.3)

## 2011-08-29 LAB — VITAMIN D 25 HYDROXY (VIT D DEFICIENCY, FRACTURES): Vit D, 25-Hydroxy: 59 ng/mL (ref 30–89)

## 2011-08-30 ENCOUNTER — Other Ambulatory Visit: Payer: Self-pay | Admitting: Oncology

## 2011-08-30 DIAGNOSIS — C50919 Malignant neoplasm of unspecified site of unspecified female breast: Secondary | ICD-10-CM

## 2011-09-04 ENCOUNTER — Ambulatory Visit (HOSPITAL_BASED_OUTPATIENT_CLINIC_OR_DEPARTMENT_OTHER): Payer: Medicare Other | Admitting: Oncology

## 2011-09-04 VITALS — BP 124/84 | HR 73 | Temp 98.5°F | Resp 20 | Ht 62.0 in | Wt 151.6 lb

## 2011-09-04 DIAGNOSIS — Z170 Estrogen receptor positive status [ER+]: Secondary | ICD-10-CM

## 2011-09-04 DIAGNOSIS — C50919 Malignant neoplasm of unspecified site of unspecified female breast: Secondary | ICD-10-CM

## 2011-09-04 NOTE — Progress Notes (Signed)
Hematology and Oncology Follow Up Visit  Kelsey Mueller [id number] 1945/05/23 66 y.o. 09/04/2011 12:16 PM   Principle Diagnosis: T1 C. N0 ER/PR positive breast cancer status post lumpectomy 09/17/2007, status post radiation therapy on tamoxifen for a year recently on Femara now on anastrozole.  Interim History:  Returns for followup she is doing well. Usual aches and pains in her shoulders. As you hot flashes. Appetite good and weight is stable. She's also having some hot flashes as noted. Overall think her pain control is fairly good. She is taking pain medication and does not take peridin c. She is on Boniva as well. I did give provide her with copies of her recent bone density test as well as mammogram.  Medications: I have reviewed the patient's current medications.  Allergies: No Known Allergies  Past Medical History, Surgical history, Social history, and Family History were reviewed and updated.  Review of Systems: Constitutional:  Negative for fever, chills, night sweats, anorexia, weight loss, pain. Cardiovascular: no chest pain or dyspnea on exertion Respiratory: no cough, shortness of breath, or wheezing Neurological: no TIA or stroke symptoms Dermatological: negative ENT: negative Skin Gastrointestinal: no abdominal pain, change in bowel habits, or black or bloody stools Genito-Urinary: no dysuria, trouble voiding, or hematuria Hematological and Lymphatic: negative Breast: negative for breast lumps Musculoskeletal: negative Remaining ROS negative.  Physical Exam: Blood pressure 124/84, pulse 73, temperature 98.5 F (36.9 C), resp. rate 20, height 5\' 2"  (1.575 m), weight 151 lb 9.6 oz (68.765 kg). ECOG: 0 General appearance: alert, cooperative and appears stated age Head: Normocephalic, without obvious abnormality, atraumatic Neck: no adenopathy, no carotid bruit, no JVD, supple, symmetrical, trachea midline and thyroid not enlarged, symmetric, no  tenderness/mass/nodules Lymph nodes: Cervical, supraclavicular, and axillary nodes normal. Cardiac : Normal heart  Pulmonary: Normal breath sounds Breasts: Right breast status post lumpectomy, scar in the event, no evidence of local recurrence. Left breast and axilla are. negative Abdomen: Normal Extremities normal Neuro: Normal  Lab Results: Lab Results  Component Value Date   WBC 6.0 08/28/2011   HGB 13.2 08/28/2011   HCT 38.8 08/28/2011   MCV 101.1* 08/28/2011   PLT 299 08/28/2011     Chemistry      Component Value Date/Time   NA 139 08/28/2011 0949   K 4.0 08/28/2011 0949   CL 103 08/28/2011 0949   CO2 28 08/28/2011 0949   BUN 21 08/28/2011 0949   CREATININE 0.83 08/28/2011 0949      Component Value Date/Time   CALCIUM 10.1 08/28/2011 0949   ALKPHOS 103 08/28/2011 0949   AST 20 08/28/2011 0949   ALT 18 08/28/2011 0949   BILITOT 0.4 08/28/2011 0949       Radiological Studies: chest X-ray n/a Mammogram 9/12- nl Bone density n/a  Impression and Plan: Kelsey Mueller is doing well without evidence of recurrence. I will see her in 6 months for followup. She will have a mammogram next month. Of note his vitamin D level is excellent.  More than 50% of the visit was spent in patient-related counselling   Pierce Crane, MD 8/28/201312:16 PM

## 2011-09-05 ENCOUNTER — Telehealth: Payer: Self-pay | Admitting: *Deleted

## 2011-09-05 NOTE — Telephone Encounter (Signed)
ptient called in th request her appointment to be changed to 03-17-2012 starting at 9:30am

## 2011-09-13 ENCOUNTER — Ambulatory Visit
Admission: RE | Admit: 2011-09-13 | Discharge: 2011-09-13 | Disposition: A | Discharge status: Home / Self Care | Payer: Medicare Other | Source: Ambulatory Visit | Attending: Internal Medicine | Admitting: Internal Medicine

## 2011-09-13 DIAGNOSIS — Z9889 Other specified postprocedural states: Secondary | ICD-10-CM

## 2011-09-13 DIAGNOSIS — Z853 Personal history of malignant neoplasm of breast: Secondary | ICD-10-CM

## 2011-10-05 ENCOUNTER — Other Ambulatory Visit: Payer: Self-pay | Admitting: Oncology

## 2011-10-05 DIAGNOSIS — C50919 Malignant neoplasm of unspecified site of unspecified female breast: Secondary | ICD-10-CM

## 2011-10-11 DIAGNOSIS — G250 Essential tremor: Secondary | ICD-10-CM | POA: Insufficient documentation

## 2012-02-08 ENCOUNTER — Telehealth: Payer: Self-pay | Admitting: *Deleted

## 2012-02-08 NOTE — Telephone Encounter (Signed)
Stacey left a message for the patient to return our call so we can reschedule her med onc appt. 

## 2012-02-10 ENCOUNTER — Encounter: Payer: Self-pay | Admitting: Oncology

## 2012-02-10 ENCOUNTER — Telehealth: Payer: Self-pay | Admitting: *Deleted

## 2012-02-10 NOTE — Telephone Encounter (Signed)
Pt returned our call and I confirmed 03/17/12 appt w/ pt.  Mailed letter & calendar to pt.

## 2012-03-10 ENCOUNTER — Other Ambulatory Visit: Payer: Medicare Other | Admitting: Lab

## 2012-03-10 ENCOUNTER — Ambulatory Visit: Payer: Medicare Other | Admitting: Oncology

## 2012-03-17 ENCOUNTER — Ambulatory Visit: Payer: Medicare Other | Admitting: Oncology

## 2012-03-17 ENCOUNTER — Other Ambulatory Visit: Payer: Medicare Other | Admitting: Lab

## 2012-03-17 ENCOUNTER — Telehealth: Payer: Self-pay | Admitting: Oncology

## 2012-03-17 ENCOUNTER — Ambulatory Visit (HOSPITAL_BASED_OUTPATIENT_CLINIC_OR_DEPARTMENT_OTHER): Payer: 59 | Admitting: Gynecologic Oncology

## 2012-03-17 ENCOUNTER — Encounter: Payer: Self-pay | Admitting: Gynecologic Oncology

## 2012-03-17 VITALS — BP 134/88 | HR 75 | Temp 98.3°F | Resp 20 | Ht 62.0 in | Wt 148.8 lb

## 2012-03-17 DIAGNOSIS — D0591 Unspecified type of carcinoma in situ of right breast: Secondary | ICD-10-CM

## 2012-03-17 DIAGNOSIS — C50919 Malignant neoplasm of unspecified site of unspecified female breast: Secondary | ICD-10-CM

## 2012-03-17 NOTE — Patient Instructions (Signed)
Doing well.  Plan to follow up in December 2014 with Dr. Darnelle Catalan with lab work before.  Please call for any questions or concerns.

## 2012-03-17 NOTE — Progress Notes (Signed)
ID: Sherren Mocha   DOB: May 27, 1945  MR#: [id number]  CSN#:[id number]  PCP:  Dr. Eric Form GYN: Dr. Leda Quail SU: Dr. Darnell Level OTHER MD:  Dr. Prince Rome with orthopedics  HISTORY OF PRESENT ILLNESS:  Kelsey Mueller is a 67 year old [place] woman who underwent annual mammography on 08/26/2007 that showed a possible mass in the right breast.  Additional views were recommended.  A diagnostic mammogram and right breast ultrasound was performed on 09/03/2007, resulting a 6 x 7 x 5 mm spiculated mass 3 o'clock position 6 cm from the right nipple suspicious for invasive mammary carcinoma.  An ultrasound-guided biopsy on 09/03/2007 showed an invasive mammary carcinoma, ER and PR positive at 100% respectively, proliferative index 9%, HER-2 was 0.  A preoperative MRI scan was performed on 09/08/2007, which resulted a solitary enhancing mass medial aspect of right breast measuring 9 x 11 x 9 mm.  No enlarged lymph nodes were seen.  The patient elected to undergo a lumpectomy and sentinel lymph node evaluation on 09/17/2007.  Final pathology showed a 1.1 cm, grade 2 of 3, invasive and in situ mammary carcinoma with clear surgical margins and lymphovascular invasion was not seen.  Two lymph nodes were negative for malignancy.  She then underwent radiation therapy under the care of Dr. Dayton Scrape from 10/27/07 to 11/30/07.  She was started on Tamoxifen in December of 2009 and took the medication for around a year and a half.  She was then switched to Anastrozole in April of 2011 and has been on it since.    INTERVAL HISTORY:  She presents today for continued follow up.  She is tolerating Anastrozole well except for joint aches at nighttime.  She reports having right shoulder and bilateral hip pain.  She states that she took Prednisone for an acute illness and her hip/shoulder pain improved.  No other complaints voiced.  REVIEW OF SYSTEMS: Constitutional: Feels well.  Cardiovascular: No chest pain, shortness of breath,  or edema.  Pulmonary: No cough or wheeze.  Gastrointestinal: No nausea, vomiting, or diarrhea. No bright red blood per rectum or change in bowel movement.  Genitourinary: No frequency, urgency, or dysuria. No vaginal bleeding or discharge.  Musculoskeletal:  Intermittent right shoulder pain and bilateral hip pain. Neurologic: No weakness, numbness, or change in gait.  Psychology: No depression, anxiety, or insomnia  PAST MEDICAL HISTORY: Past Medical History  Diagnosis Date  . H/O: HTN (hypertension)   . Osteoporosis   . GERD (gastroesophageal reflux disease)   . Depression   . Breast cancer     s/p XRT and lumpectomy 2009  . Obesity   . Allergic rhinoconjunctivitis     PAST SURGICAL HISTORY: Past Surgical History  Procedure Laterality Date  . Nissen fundoplication  2005  . Breast lumpectomy  2009    R. Pierce Crane   . Tonsillectomy    . Hiatal hernia repair      Nissen     FAMILY HISTORY Family History  Problem Relation Age of Onset  . Coronary artery disease Neg Hx     GYNECOLOGIC HISTORY:  She is G3, P3, menarche age 79, parity age 44.  She has been on hormone replacement therapy for two years and stopped about a decade ago.  Menopause about 16 years ago.  She took Prempro and Premarin.    SOCIAL HISTORY:  Married to husband who apparently teaches at [employer] in Business and Finance for 42 years.  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  She has three children, a daughter Noreene Larsson, who lives in [place], a son Greig Castilla, living in [place], daughter Jacki Cones, living in [place].  She has four grandchildren.  One of her daughters has two adopted daughters from [place].    ADVANCED DIRECTIVES:  Living will.  HEALTH MAINTENANCE: History  Substance Use Topics  . Smoking status: Never Smoker   . Smokeless tobacco: Not on file  . Alcohol Use: No    Colonoscopy:  Almost ten years ago, to schedule  PAP:  2013  Bone density: 04/05/11  Lipid panel:  Managed  by Dr. Clelia Croft  No Known Allergies  Current Outpatient Prescriptions  Medication Sig Dispense Refill  . anastrozole (ARIMIDEX) 1 MG tablet TAKE 1 TABLET BY MOUTH EVERY DAY  30 tablet  5  . buPROPion (WELLBUTRIN XL) 300 MG 24 hr tablet Take 300 mg by mouth daily.      . Calcium Citrate-Vitamin D (CITRACAL/VITAMIN D) 250-200 MG-UNIT TABS Take 1 tablet by mouth daily.        . diclofenac (VOLTAREN) 75 MG EC tablet Take 75 mg by mouth 2 (two) times daily.      . ergocalciferol (VITAMIN D2) 50000 UNITS capsule Take 50,000 Units by mouth once a week.        . hydrochlorothiazide (,MICROZIDE/HYDRODIURIL,) 12.5 MG capsule Take 12.5 mg by mouth daily.        Marland Kitchen ibandronate (BONIVA) 150 MG tablet Take 150 mg by mouth every 30 (thirty) days. Take in the morning with a full glass of water, on an empty stomach, and do not take anything else by mouth or lie down for the next 30 min.      Marland Kitchen ibuprofen (ADVIL,MOTRIN) 200 MG tablet Take 200 mg by mouth every 6 (six) hours as needed.        . Multiple Vitamins-Minerals (CENTRUM SILVER PO) Take 1 tablet by mouth daily.        . Omega-3 Fatty Acids (FISH OIL PO) Take 2,000 mg by mouth daily.        Marland Kitchen zolpidem (AMBIEN) 5 MG tablet Take by mouth at bedtime as needed.        Marland Kitchen omeprazole (PRILOSEC) 20 MG capsule Take 20 mg by mouth daily.         No current facility-administered medications for this visit.    OBJECTIVE: Filed Vitals:   03/17/12 0853  BP: 134/88  Pulse: 75  Temp: 98.3 F (36.8 C)  Resp: 20     Body mass index is 27.21 kg/(m^2).    ECOG FS:  Symptomatic but completely ambulatory  General: Well developed, well nourished female in no acute distress. Alert and oriented x 3.  Head/ Neck: Oropharynx clear.  Sclerae anicteric.  Supple without any enlargements.  Lymph node survey: No cervical, supraclavicular, or axillary adenopathy  Cardiovascular: Regular rate and rhythm. S1 and S2 normal.  Lungs: Clear to auscultation bilaterally. No  wheezes/crackles/rhonchi noted.  Skin: No rashes or lesions present. Back: No CVA tenderness.  Musculo:  No focal spinal tenderness.  Full range of motion with the right shoulder.  Mild tenderness with palpation of the right shoulder, none reported in the hips. Abdomen: Abdomen soft, non-tender and obese. Active bowel sounds in all quadrants. No evidence of a fluid wave or abdominal masses.  Breasts:  Inspection negative with no masses, nodularity, erythema, or discharge noted bilaterally.  Extremities: No bilateral cyanosis, edema, or clubbing.   LAB RESULTS: Lab Results  Component Value  Date   WBC 6.0 08/28/2011   NEUTROABS 3.2 08/28/2011   HGB 13.2 08/28/2011   HCT 38.8 08/28/2011   MCV 101.1* 08/28/2011   PLT 299 08/28/2011      Chemistry      Component Value Date/Time   NA 139 08/28/2011 0949   K 4.0 08/28/2011 0949   CL 103 08/28/2011 0949   CO2 28 08/28/2011 0949   BUN 21 08/28/2011 0949   CREATININE 0.83 08/28/2011 0949      Component Value Date/Time   CALCIUM 10.1 08/28/2011 0949   ALKPHOS 103 08/28/2011 0949   AST 20 08/28/2011 0949   ALT 18 08/28/2011 0949   BILITOT 0.4 08/28/2011 0949       Lab Results  Component Value Date   LABCA2 22 11/14/2010    No components found with this basename: [id number]    No results found for this basename: INR,  in the last 168 hours  Urinalysis    Component Value Date/Time   COLORURINE YELLOW 09/15/2007 0930   APPEARANCEUR CLOUDY* 09/15/2007 0930   LABSPEC 1.019 09/15/2007 0930   PHURINE 5.5 09/15/2007 0930   GLUCOSEU NEGATIVE 09/15/2007 0930   HGBUR NEGATIVE 09/15/2007 0930   BILIRUBINUR NEGATIVE 09/15/2007 0930   KETONESUR NEGATIVE 09/15/2007 0930   PROTEINUR NEGATIVE 09/15/2007 0930   UROBILINOGEN 0.2 09/15/2007 0930   NITRITE NEGATIVE 09/15/2007 0930   LEUKOCYTESUR MODERATE* 09/15/2007 0930    STUDIES: No results found.  ASSESSMENT: 67 y.o. [place] woman: #1 S/P right lumpectomy with sentinel node biopsy on 09/17/07 by Dr. Gerrit Friends for a  T1cN0, Stage 1, IMC and DCIS, ER+, PR+, HER2-, Ki67 9%.  #2 She underwent radiation therapy from 10/27/07 to 11/30/07 under the care of Dr. Dayton Scrape.  #3 She took Tamoxifen for a year and a half starting in December of 2009 and was switched to Anastrozole in April of 2011.  #4 Anastrozole currently since April of 2011.  PLAN:  She is to follow up with Dr. Darnelle Catalan in December 2014 with lab work prior to that appointment or sooner if needed.  She is continue taking Anastrozole as prescribed with plans for stopping the medication in December 2014.  She is instructed to call for any questions or concerns.    The patient was reviewed with Dr. Darnelle Catalan, who spoke with the patient about future plans and recommendations.  CROSS, MELISSA DEAL    03/17/2012

## 2012-03-17 NOTE — Telephone Encounter (Signed)
gv pt appt schedule for December and mammo for 09/14/12.

## 2012-04-21 ENCOUNTER — Other Ambulatory Visit: Payer: Self-pay | Admitting: Oncology

## 2012-04-21 DIAGNOSIS — C50919 Malignant neoplasm of unspecified site of unspecified female breast: Secondary | ICD-10-CM

## 2012-06-13 DIAGNOSIS — S8990XA Unspecified injury of unspecified lower leg, initial encounter: Secondary | ICD-10-CM

## 2012-06-13 HISTORY — DX: Unspecified injury of unspecified lower leg, initial encounter: S89.90XA

## 2012-07-13 ENCOUNTER — Encounter: Payer: Self-pay | Admitting: Obstetrics & Gynecology

## 2012-07-14 ENCOUNTER — Ambulatory Visit: Payer: Self-pay | Admitting: Obstetrics & Gynecology

## 2012-09-01 ENCOUNTER — Other Ambulatory Visit: Payer: Self-pay | Admitting: Obstetrics & Gynecology

## 2012-09-02 ENCOUNTER — Other Ambulatory Visit: Payer: Self-pay | Admitting: Family Medicine

## 2012-09-02 DIAGNOSIS — M25561 Pain in right knee: Secondary | ICD-10-CM

## 2012-09-02 DIAGNOSIS — R609 Edema, unspecified: Secondary | ICD-10-CM

## 2012-09-02 NOTE — Telephone Encounter (Signed)
Patient's AEX scheduled for 09/18/12 #1/0rf's sent to last pt until then.

## 2012-09-11 ENCOUNTER — Ambulatory Visit
Admission: RE | Admit: 2012-09-11 | Discharge: 2012-09-11 | Disposition: A | Discharge status: Home / Self Care | Payer: Medicare Other | Source: Ambulatory Visit | Attending: Family Medicine | Admitting: Family Medicine

## 2012-09-11 DIAGNOSIS — M25561 Pain in right knee: Secondary | ICD-10-CM

## 2012-09-11 DIAGNOSIS — R609 Edema, unspecified: Secondary | ICD-10-CM

## 2012-09-14 ENCOUNTER — Ambulatory Visit
Admission: RE | Admit: 2012-09-14 | Discharge: 2012-09-14 | Disposition: A | Discharge status: Home / Self Care | Payer: Medicare Other | Source: Ambulatory Visit | Attending: Gynecologic Oncology | Admitting: Gynecologic Oncology

## 2012-09-14 DIAGNOSIS — D0591 Unspecified type of carcinoma in situ of right breast: Secondary | ICD-10-CM

## 2012-09-18 ENCOUNTER — Ambulatory Visit: Payer: Self-pay | Admitting: Obstetrics & Gynecology

## 2012-09-25 ENCOUNTER — Encounter: Payer: Self-pay | Admitting: Obstetrics & Gynecology

## 2012-09-28 ENCOUNTER — Ambulatory Visit (INDEPENDENT_AMBULATORY_CARE_PROVIDER_SITE_OTHER): Payer: Medicare Other | Admitting: Obstetrics & Gynecology

## 2012-09-28 ENCOUNTER — Encounter: Payer: Self-pay | Admitting: Obstetrics & Gynecology

## 2012-09-28 VITALS — BP 156/88 | HR 60 | Resp 16 | Ht 62.0 in | Wt 154.8 lb

## 2012-09-28 DIAGNOSIS — Z01419 Encounter for gynecological examination (general) (routine) without abnormal findings: Secondary | ICD-10-CM

## 2012-09-28 DIAGNOSIS — Z124 Encounter for screening for malignant neoplasm of cervix: Secondary | ICD-10-CM

## 2012-09-28 MED ORDER — IBANDRONATE SODIUM 150 MG PO TABS: 150.0000 mg | ORAL_TABLET | ORAL | Status: DC

## 2012-09-28 NOTE — Patient Instructions (Signed)

## 2012-09-28 NOTE — Progress Notes (Signed)
67 y.o. G3P3 MarriedCaucasianF here for annual exam.  Larey Seat in July and "cracked" femur underneath knee cap.  Slipped on mat going into gym.  Went to ortho urgent care and had an x-ray.  Was in immobilizer for five weeks and then smaller immobilizer.  Now having some swelling in knee.  Has physical with Dr. Clelia Croft in October.  Patient's last menstrual period was 01/07/1997.          Sexually active: yes  The current method of family planning is tubal ligation.    Exercising: no  not regularly Smoker:  no  Health Maintenance: Pap:  02/14/09 WNL History of abnormal Pap:  no MMG:  09/14/12 diag in one year Colonoscopy:  2006 repeat in 10 years, [facility] BMD:   04/05/11 -2.3/-1.2/-1.5 TDaP:  8/08 Zostavax and pneumonia vaccines done Screening Labs: PCP, Hb today: PCP, Urine today: PCP   reports that she has never smoked. She has never used smokeless tobacco. She reports that  drinks alcohol. She reports that she does not use illicit drugs.  Past Medical History  Diagnosis Date  . H/O: HTN (hypertension)   . Osteoporosis   . GERD (gastroesophageal reflux disease)   . Depression   . Breast cancer     s/p XRT and lumpectomy 2009  . Obesity   . Allergic rhinoconjunctivitis   . Infertility     took clomid with second pregnancy  . Migraine     menstrual  . Polio     age 61 month old  . Anemia   . Knee injury 06/13/12    fell, cracked femur under knee cap    Past Surgical History  Procedure Laterality Date  . Nissen fundoplication  2005  . Breast lumpectomy  2009    R. Pierce Crane   . Tonsillectomy    . Hiatal hernia repair      Nissen   . Tubal ligation    . Breast biopsy      right breast ER/PR +    Current Outpatient Prescriptions  Medication Sig Dispense Refill  . anastrozole (ARIMIDEX) 1 MG tablet TAKE 1 TABLET BY MOUTH EVERY DAY  30 tablet  8  . buPROPion (WELLBUTRIN XL) 300 MG 24 hr tablet Take 300 mg by mouth daily.      . Calcium Citrate-Vitamin D (CITRACAL/VITAMIN D)  250-200 MG-UNIT TABS Take 1 tablet by mouth daily.        . Cholecalciferol (VITAMIN D PO) Take by mouth daily.      . diclofenac (VOLTAREN) 75 MG EC tablet Take 75 mg by mouth 2 (two) times daily.      . fexofenadine (ALLEGRA) 180 MG tablet Take 180 mg by mouth. As needed      . hydrochlorothiazide (,MICROZIDE/HYDRODIURIL,) 12.5 MG capsule Take 12.5 mg by mouth daily.        Marland Kitchen ibandronate (BONIVA) 150 MG tablet TAKE 1 TABLET BY MOUTH EVERY MONTH  1 tablet  0  . ibuprofen (ADVIL,MOTRIN) 200 MG tablet Take 200 mg by mouth every 6 (six) hours as needed.        . Multiple Vitamins-Minerals (CENTRUM SILVER PO) Take 1 tablet by mouth daily.        . Omega-3 Fatty Acids (FISH OIL PO) Take 2,000 mg by mouth daily.        Marland Kitchen zolpidem (AMBIEN) 5 MG tablet Take by mouth at bedtime as needed.         No current facility-administered medications for this visit.  Family History  Problem Relation Age of Onset  . Coronary artery disease Neg Hx   . Breast cancer Paternal Aunt     ROS:  Pertinent items are noted in HPI.  Otherwise, a comprehensive ROS was negative.  Exam:   BP 156/88  Pulse 60  Resp 16  Ht 5\' 2"  (1.575 m)  Wt 154 lb 12.8 oz (70.217 kg)  BMI 28.31 kg/m2  LMP 01/07/1997  Weight change: +5lb  Height: 5\' 2"  (157.5 cm)  Ht Readings from Last 3 Encounters:  09/28/12 5\' 2"  (1.575 m)  03/17/12 5\' 2"  (1.575 m)  09/04/11 5\' 2"  (1.575 m)    General appearance: alert, cooperative and appears stated age Head: Normocephalic, without obvious abnormality, atraumatic Neck: no adenopathy, supple, symmetrical, trachea midline and thyroid normal to inspection and palpation Lungs: clear to auscultation bilaterally Breasts: normal appearance, no masses or tenderness Heart: regular rate and rhythm Abdomen: soft, non-tender; bowel sounds normal; no masses,  no organomegaly Extremities: extremities normal, atraumatic, no cyanosis or edema Skin: Skin color, texture, turgor normal. No rashes or  lesions Lymph nodes: Cervical, supraclavicular, and axillary nodes normal. No abnormal inguinal nodes palpated Neurologic: Grossly normal   Pelvic: External genitalia:  no lesions              Urethra:  normal appearing urethra with no masses, tenderness or lesions              Bartholins and Skenes: normal                 Vagina: normal appearing vagina with normal color and discharge, no lesions              Cervix: no lesions              Pap taken: yes Bimanual Exam:  Uterus:  normal size, contour, position, consistency, mobility, non-tender              Adnexa: normal adnexa and no mass, fullness, tenderness               Rectovaginal: Confirms               Anus:  normal sphincter tone, no lesions  A:  Well Woman with normal exam H/O breast cancer 2009.  "Graduating" from Dr. Darnelle Catalan in ER+/PR+.  Negative genetic testing. Hypertension Strong family hx of breast cancer  P:   Mammogram yearly.  Just did it in September. pap smear today. On Boniva 150mg  monthly.  Complete 5 full years (2015) and then do drug holiday.   return annually or prn  An After Visit Summary was printed and given to the patient.

## 2012-09-29 ENCOUNTER — Other Ambulatory Visit: Payer: Self-pay | Admitting: Obstetrics & Gynecology

## 2012-09-29 NOTE — Telephone Encounter (Signed)
09/28/12 rx was sent #3/4rf's- Denied

## 2012-12-11 ENCOUNTER — Other Ambulatory Visit: Payer: Self-pay | Admitting: *Deleted

## 2012-12-11 DIAGNOSIS — C50911 Malignant neoplasm of unspecified site of right female breast: Secondary | ICD-10-CM

## 2012-12-14 ENCOUNTER — Other Ambulatory Visit (HOSPITAL_BASED_OUTPATIENT_CLINIC_OR_DEPARTMENT_OTHER): Payer: Medicare Other | Admitting: Lab

## 2012-12-14 ENCOUNTER — Ambulatory Visit (HOSPITAL_BASED_OUTPATIENT_CLINIC_OR_DEPARTMENT_OTHER): Payer: Medicare Other | Admitting: Oncology

## 2012-12-14 VITALS — BP 143/83 | HR 83 | Temp 98.9°F | Resp 18 | Ht 62.0 in | Wt 157.2 lb

## 2012-12-14 DIAGNOSIS — C50911 Malignant neoplasm of unspecified site of right female breast: Secondary | ICD-10-CM

## 2012-12-14 DIAGNOSIS — Z853 Personal history of malignant neoplasm of breast: Secondary | ICD-10-CM

## 2012-12-14 LAB — CBC WITH DIFFERENTIAL/PLATELET
BASO%: 1.3 % (ref 0.0–2.0)
Eosinophils Absolute: 0.7 10*3/uL — ABNORMAL HIGH (ref 0.0–0.5)
HCT: 38.2 % (ref 34.8–46.6)
LYMPH%: 33.4 % (ref 14.0–49.7)
MCHC: 34.2 g/dL (ref 31.5–36.0)
MONO#: 0.4 10*3/uL (ref 0.1–0.9)
MONO%: 7.8 % (ref 0.0–14.0)
NEUT#: 2.1 10*3/uL (ref 1.5–6.5)
Platelets: 310 10*3/uL (ref 145–400)
RBC: 3.85 10*6/uL (ref 3.70–5.45)
RDW: 13.3 % (ref 11.2–14.5)
WBC: 4.8 10*3/uL (ref 3.9–10.3)
lymph#: 1.6 10*3/uL (ref 0.9–3.3)

## 2012-12-14 LAB — COMPREHENSIVE METABOLIC PANEL (CC13)
ALT: 16 U/L (ref 0–55)
AST: 20 U/L (ref 5–34)
Albumin: 3.8 g/dL (ref 3.5–5.0)
Alkaline Phosphatase: 91 U/L (ref 40–150)
CO2: 24 mEq/L (ref 22–29)
Potassium: 3.7 mEq/L (ref 3.5–5.1)
Sodium: 147 mEq/L — ABNORMAL HIGH (ref 136–145)
Total Bilirubin: 0.36 mg/dL (ref 0.20–1.20)
Total Protein: 6.9 g/dL (ref 6.4–8.3)

## 2012-12-14 NOTE — Progress Notes (Signed)
ID: Kelsey Mueller   DOB: 01-03-1946  MR#: [id number]  CSN#:[id number]  PCP:  Dr. Eric Mueller GYN: Dr. Leda Mueller SU: Dr. Darnell Mueller OTHER MD:  Dr. Prince Mueller with orthopedics  HISTORY OF PRESENT ILLNESS:  Kelsey Mueller is a 67 year old [place] woman who underwent annual mammography on 08/26/2007 that showed a possible mass in the right breast.  Additional views were recommended.  A diagnostic mammogram and right breast ultrasound was performed on 09/03/2007, resulting a 6 x 7 x 5 mm spiculated mass 3 o'clock position 6 cm from the right nipple suspicious for invasive mammary carcinoma.  An ultrasound-guided biopsy on 09/03/2007 showed an invasive mammary carcinoma, ER and PR positive at 100% respectively, proliferative index 9%, HER-2 was 0.  A preoperative MRI scan was performed on 09/08/2007, which resulted a solitary enhancing mass medial aspect of right breast measuring 9 x 11 x 9 mm.  No enlarged lymph nodes were seen.  The patient elected to undergo a lumpectomy and sentinel lymph node evaluation on 09/17/2007.  Final pathology showed a 1.1 cm, grade 2 of 3, invasive and in situ mammary carcinoma with clear surgical margins and lymphovascular invasion was not seen.  Two lymph nodes were negative for malignancy.  She then underwent radiation therapy under the care of Dr. Dayton Mueller from 10/27/07 to 11/30/07.  She was started on Tamoxifen in December of 2009 and took the medication for around a year and a half.  She was then switched to Anastrozole in April of 2011 and has been on it since.    INTERVAL HISTORY:  He returns today for routine followup. Since her last visit here she had a fracture in the right kneecap area which in mobilizer for quite a while as a result she gained 10 pounds. From a breast cancer point of view she is "fine", and has tolerated the anastrozole with no significant side effects.  REVIEW OF SYSTEMS: Just a little bit of ear drainage, she gets short of breath when climbing stairs,  she has stress urinary incontinence, is mildly depressed, though she hopes to get off the [employer] soon, and she has some low back pain on and off. None of these symptoms are new, or more persistent or intense than before. A detailed review of systems today was otherwise noncontributory. Unfortunately she has not been able to exercise at the gym because of the right leg injury.  PAST MEDICAL HISTORY: Past Medical History  Diagnosis Date  . H/O: HTN (hypertension)   . Osteoporosis   . GERD (gastroesophageal reflux disease)   . Depression   . Breast cancer     s/p XRT and lumpectomy 2009  . Obesity   . Allergic rhinoconjunctivitis   . Infertility     took clomid with second pregnancy  . Migraine     menstrual  . Polio     age 17 month old  . Anemia   . Knee injury 06/13/12    fell, cracked femur under knee cap    PAST SURGICAL HISTORY: Past Surgical History  Procedure Laterality Date  . Nissen fundoplication  2005  . Breast lumpectomy  2009    R. [facility]   . Tonsillectomy    . Hiatal hernia repair      Nissen   . Tubal ligation    . Breast biopsy      right breast ER/PR +    FAMILY HISTORY Family History  Problem Relation Age of Onset  . Coronary artery  disease Neg Hx   . Breast cancer Paternal Aunt     GYNECOLOGIC HISTORY:  She is G3, P3, menarche age 9, parity age 31.  She has been on hormone replacement therapy for two years and stopped about a decade ago.  Menopause about 16 years ago.  She took Prempro and Premarin.    SOCIAL HISTORY:  Married, her husband teaches at [employer] in Business and [employer] for 42 years.  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  She has three children, a daughter Kelsey [employer], who lives in [place], a son [employer]sey [employer], living in [place], daughter Kelsey [employer], living in [place].  She has four grandchildren.  One of her daughters has two adopted daughters from [employer].    ADVANCED DIRECTIVES:  Living will.  HEALTH  MAINTENANCE: History  Substance Use Topics  . Smoking status: Never Smoker   . Smokeless tobacco: Never Used  . Alcohol Use: Yes     Comment: occ    Colonoscopy:  Almost ten years ago, to schedule  PAP:  2013  Bone density: 04/05/11  Lipid panel:  Managed by Dr. Clelia Mueller  No Known Allergies  Current Outpatient Prescriptions  Medication Sig Dispense Refill  . anastrozole (ARIMIDEX) 1 MG tablet TAKE 1 TABLET BY MOUTH EVERY DAY  30 tablet  8  . buPROPion ([facility] XL) 300 MG 24 hr tablet Take 300 mg by mouth daily.      . Calcium Citrate-Vitamin D (CITRACAL/VITAMIN D) 250-200 MG-UNIT TABS Take 1 tablet by mouth daily.        . Cholecalciferol (VITAMIN D PO) Take by mouth daily.      . diclofenac (VOLTAREN) 75 MG EC tablet Take 75 mg by mouth 2 (two) times daily.      . fexofenadine (ALLEGRA) 180 MG tablet Take 180 mg by mouth. As needed      . hydrochlorothiazide (,MICROZIDE/HYDRODIURIL,) 12.5 MG capsule Take 12.5 mg by mouth daily.        Marland Kitchen ibandronate (BONIVA) 150 MG tablet Take 1 tablet (150 mg total) by mouth every 30 (thirty) days. Take in the morning with a full glass of water, on an empty stomach, and do not take anything else by mouth or lie down for the next 30 min.  3 tablet  4  . ibuprofen (ADVIL,MOTRIN) 200 MG tablet Take 200 mg by mouth every 6 (six) hours as needed.        . Multiple Vitamins-Minerals (CENTRUM SILVER PO) Take 1 tablet by mouth daily.        . Omega-3 Fatty Acids (FISH OIL PO) Take 2,000 mg by mouth daily.        Marland Kitchen zolpidem (AMBIEN) 5 MG tablet Take by mouth at bedtime as needed.         No current facility-administered medications for this visit.    OBJECTIVE: Middle-aged white woman in no acute distress her Filed Vitals:   12/14/12 0906  BP: 143/83  Pulse: 83  Temp: 98.9 F (37.2 C)  Resp: 18     Body mass index is 28.74 kg/(m^2).    ECOG FS:  Symptomatic but completely ambulatory  Sclerae unicteric, pupils equal and round Oropharynx clear and  moist-- no thrush or other lesions No cervical or supraclavicular adenopathy Lungs no rales or rhonchi Heart regular rate and rhythm Abd soft, nontender, positive bowel sounds MSK no focal spinal tenderness, no upper extremity lymphedema Neuro: nonfocal, well oriented, appropriate affect Breasts: The right breast is status post lumpectomy  and radiation. There is no evidence of local recurrence. The right axilla is benign to the left breast is unremarkable   LAB RESULTS: Lab Results  Component Value Date   WBC 4.8 12/14/2012   NEUTROABS 2.1 12/14/2012   HGB 13.1 12/14/2012   HCT 38.2 12/14/2012   MCV 99.3 12/14/2012   PLT 310 12/14/2012      Chemistry      Component Value Date/Time   NA 147* 12/14/2012 0828   NA 139 08/28/2011 0949   K 3.7 12/14/2012 0828   K 4.0 08/28/2011 0949   CL 103 08/28/2011 0949   CO2 24 12/14/2012 0828   CO2 28 08/28/2011 0949   BUN 21.5 12/14/2012 0828   BUN 21 08/28/2011 0949   CREATININE 0.8 12/14/2012 0828   CREATININE 0.83 08/28/2011 0949      Component Value Date/Time   CALCIUM 9.8 12/14/2012 0828   CALCIUM 10.1 08/28/2011 0949   ALKPHOS 91 12/14/2012 0828   ALKPHOS 103 08/28/2011 0949   AST 20 12/14/2012 0828   AST 20 08/28/2011 0949   ALT 16 12/14/2012 0828   ALT 18 08/28/2011 0949   BILITOT 0.36 12/14/2012 0828   BILITOT 0.4 08/28/2011 0949       Lab Results  Component Value Date   LABCA2 22 11/14/2010    No components found with this basename: [id number]    No results found for this basename: INR,  in the last 168 hours  Urinalysis    Component Value Date/Time   COLORURINE YELLOW 09/15/2007 0930   APPEARANCEUR CLOUDY* 09/15/2007 0930   LABSPEC 1.019 09/15/2007 0930   PHURINE 5.5 09/15/2007 0930   GLUCOSEU NEGATIVE 09/15/2007 0930   HGBUR NEGATIVE 09/15/2007 0930   BILIRUBINUR NEGATIVE 09/15/2007 0930   KETONESUR NEGATIVE 09/15/2007 0930   PROTEINUR NEGATIVE 09/15/2007 0930   UROBILINOGEN 0.2 09/15/2007 0930   NITRITE NEGATIVE 09/15/2007 0930   LEUKOCYTESUR  MODERATE* 09/15/2007 0930    STUDIES: DIGITAL DIAGNOSTIC BILATERAL MAMMOGRAM WITH CAD  Comparison: 09/13/2011, 09/11/2010, 09/07/2009  Findings:  ACR Breast Density Category b: There are scattered areas of  fibroglandular density.  Post lumpectomy scar upper inner quadrant right breast, with a few  associated benign fat necrosis calcifications, stable. No  significant interval change or suspicious findings on either side.  Mammographic images were processed with CAD.  IMPRESSION:  Stable benign postoperative change  BI-RADS CATEGORY 2: Benign finding(s).  RECOMMENDATION:  Diagnostic bilateral mammogram in 1 year  I have discussed the findings and recommendations with the patient.  Results were also provided in writing at the conclusion of the  visit. If applicable, a reminder letter will be sent to the  patient regarding her next appointment.  Original Report Authenticated By: Esperanza Heir, M.D. And a I remember what we're going to do her to [place] and he can be one of the and she to the right chest he is alert and welsh is taking Hyzaar her and I will for a the chest is treatment is more the next week and a little July she's doing a was she'll showed that she is to change feet that she 1 is final and I couldn't will   ASSESSMENT: 67 y.o. [place] woman:  #1 S/P right lumpectomy with sentinel node biopsy on 09/17/07 by Dr. Gerrit Friends for a T1cN0, Stage 1, IMC and DCIS, ER+, PR+, HER2-, Ki67 9%.  #2 She underwent radiation therapy from 10/27/07 to 11/30/07 under the care of Dr. Dayton Mueller.  #3 She took Tamoxifen for  a year and a half starting in December of 2009 and was switched to Anastrozole in April of 2011.  #4 Anastrozole currently since April of 2011.  PLAN:  Jaquanda has completed her 5 years of antiestrogen therapy and I am comfortable releasing her to her primary care physician. She understands we do not have data for continuing beyond 5 years in her situation. The combination of  tamoxifen followed by an aromatase inhibitor totaling 5 years is optimal.  Ebelin understands she has a very good prognosis.She will need yearly mammography and a yearly physician breast exam.  We will be glad to see her again on an as needed basis, but as of now no further routine appointments are being made for her here. She is in agreement with this plan.  MAGRINAT,GUSTAV C    12/14/2012

## 2012-12-17 NOTE — Addendum Note (Signed)
Addended by: Billey Co on: 12/17/2012 05:27 PM   Modules accepted: Orders, Medications

## 2013-04-22 ENCOUNTER — Telehealth: Payer: Self-pay

## 2013-04-22 NOTE — Telephone Encounter (Signed)
lmtcb to discuss BMD results.//kn 

## 2013-05-10 ENCOUNTER — Telehealth: Payer: Self-pay | Admitting: Obstetrics & Gynecology

## 2013-05-10 NOTE — Telephone Encounter (Signed)
Pt states she is returning a call to [facility] there is no message.

## 2013-05-10 NOTE — Telephone Encounter (Signed)
Spoke with patient. BMD results given. Advised has moderate osteopenia. Hips are stable. Spine has decreased a little. Stay on Boniva and repeat BMD in 2 years. Patient states at last office visit Dr.Miller advised her to complete 5 years on Boniva and then to take a "drug holiday." Per last OV note On Boniva 150mg  monthly. Complete 5 full years (2015) and then do drug holiday. Advised to follow with this plan and we will repeat BMD in two years. Patient agreeable and verbalizes understanding.   Routing to provider for final review. Patient agreeable to disposition. Will close encounter

## 2013-05-17 NOTE — Telephone Encounter (Signed)
Patient notified of BMD results by triage nurse//kn

## 2013-05-24 ENCOUNTER — Other Ambulatory Visit (HOSPITAL_COMMUNITY): Payer: Medicare Other

## 2013-06-02 ENCOUNTER — Encounter: Payer: Self-pay | Admitting: Obstetrics & Gynecology

## 2013-06-02 ENCOUNTER — Encounter (HOSPITAL_COMMUNITY): Admission: RE | Payer: Self-pay | Source: Ambulatory Visit

## 2013-06-02 ENCOUNTER — Inpatient Hospital Stay (HOSPITAL_COMMUNITY): Admission: RE | Admit: 2013-06-02 | Payer: Medicare Other | Source: Ambulatory Visit | Admitting: Orthopedic Surgery

## 2013-06-02 SURGERY — ARTHROPLASTY, KNEE, TOTAL
Anesthesia: General | Site: Knee | Laterality: Right

## 2013-08-12 ENCOUNTER — Other Ambulatory Visit: Payer: Self-pay | Admitting: Obstetrics & Gynecology

## 2013-08-12 DIAGNOSIS — Z853 Personal history of malignant neoplasm of breast: Secondary | ICD-10-CM

## 2013-08-12 DIAGNOSIS — Z9889 Other specified postprocedural states: Secondary | ICD-10-CM

## 2013-09-28 ENCOUNTER — Ambulatory Visit
Admission: RE | Admit: 2013-09-28 | Discharge: 2013-09-28 | Disposition: A | Discharge status: Home / Self Care | Payer: Medicare Other | Source: Ambulatory Visit | Attending: Obstetrics & Gynecology | Admitting: Obstetrics & Gynecology

## 2013-09-28 DIAGNOSIS — Z853 Personal history of malignant neoplasm of breast: Secondary | ICD-10-CM

## 2013-09-28 DIAGNOSIS — Z9889 Other specified postprocedural states: Secondary | ICD-10-CM

## 2013-10-12 ENCOUNTER — Ambulatory Visit: Payer: Medicare Other | Admitting: Obstetrics & Gynecology

## 2013-10-21 ENCOUNTER — Other Ambulatory Visit: Payer: Self-pay | Admitting: Orthopedic Surgery

## 2013-10-21 NOTE — Progress Notes (Signed)
Preoperative surgical orders have been place into the Epic hospital system for [employer] [employer] on 83/33/8329, 1:33 PM  by Mickel Crow for surgery on 11-15-2013.  Preop Total Knee orders including Experal, IV Tylenol, and IV Decadron as long as there are no contraindications to the above medications. Arlee Muslim, PA-C

## 2013-11-02 ENCOUNTER — Encounter (HOSPITAL_COMMUNITY): Payer: Self-pay | Admitting: Pharmacy Technician

## 2013-11-08 ENCOUNTER — Other Ambulatory Visit (HOSPITAL_COMMUNITY): Payer: Self-pay | Admitting: *Deleted

## 2013-11-08 ENCOUNTER — Encounter: Payer: Self-pay | Admitting: Obstetrics & Gynecology

## 2013-11-08 NOTE — Progress Notes (Addendum)
Medical clearance note Dr. Brigitte Pulse on chart for 11-15-13 surgery ekg 10-27-13 Dr. Brigitte Pulse on chart LOV note 10/27/13 Dr. Brigitte Pulse on chart Abnormal urine culture results 10/20/13 on chart CBC and CMET results 10/20/13 on chart

## 2013-11-08 NOTE — Patient Instructions (Addendum)
[place]  56/03[birth year]   Your procedure is scheduled on: Monday November 9th, 2015  Report to [facility]  Entrance and follow signs to  [place] at 500 AM.  Call this number if you have problems the morning of surgery [phone]   Remember:  Do not eat food or drink liquids :After Midnight.     Take these medicines the morning of surgery with A SIP OF WATER: allegra if needed, protonix                               You may not have any metal on your body including hair pins and piercings  Do not wear jewelry, make-up, lotions, powders, or deodorant.   Men may shave face and neck.  Do not bring valuables to the hospital. [place].  Contacts, dentures or bridgework may not be worn into surgery.  Leave suitcase in the car. After surgery it may be brought to your room.  For patients admitted to the hospital, checkout time is 11:00 AM the day of discharge.  ________________________________________________________________________ [facility] - Preparing for Surgery Before surgery, you can play an important role.  Because skin is not sterile, your skin needs to be as free of germs as possible.  You can reduce the number of germs on your skin by washing with CHG (chlorahexidine gluconate) soap before surgery.  CHG is an antiseptic cleaner which kills germs and bonds with the skin to continue killing germs even after washing. Please DO NOT use if you have an allergy to CHG or antibacterial soaps.  If your skin becomes reddened/irritated stop using the CHG and inform your nurse when you arrive at Short Stay. Do not shave (including legs and underarms) for at least 48 hours prior to the first CHG shower.  You may shave your face/neck. Please follow these instructions carefully:  1.  Shower with CHG Soap the night before surgery and the  morning of Surgery.  2.  If you choose to wash your hair, wash your hair first as usual with your   normal  shampoo.  3.  After you shampoo, rinse your hair and body thoroughly to remove the  shampoo.                            4.  Use CHG as you would any other liquid soap.  You can apply chg directly  to the skin and wash                       Gently with a scrungie or clean washcloth.  5.  Apply the CHG Soap to your body ONLY FROM THE NECK DOWN.   Do not use on face/ open                           Wound or open sores. Avoid contact with eyes, ears mouth and genitals (private parts).                       Wash face,  Genitals (private parts) with your normal soap.             6.  Wash thoroughly, paying special attention to the area where your surgery  will be performed.  7.  Thoroughly rinse your body with warm water from the neck down.  8.  DO NOT shower/wash with your normal soap after using and rinsing off  the CHG Soap.                9.  Pat yourself dry with a clean towel.            10.  Wear clean pajamas.            11.  Place clean sheets on your bed the night of your first shower and do not  sleep with pets. Day of Surgery : Do not apply any lotions/deodorants the morning of surgery.  Please wear clean clothes to the hospital/surgery center.  FAILURE TO FOLLOW THESE INSTRUCTIONS MAY RESULT IN THE CANCELLATION OF YOUR SURGERY PATIENT SIGNATURE_________________________________  NURSE SIGNATURE__________________________________  ________________________________________________________________________    Kelsey Mueller  An incentive spirometer is a tool that can help keep your lungs clear and active. This tool measures how well you are filling your lungs with each breath. Taking long deep breaths may help reverse or decrease the chance of developing breathing (pulmonary) problems (especially infection) following:  A long period of time when you are unable to move or be active. BEFORE THE PROCEDURE   If the spirometer includes an indicator to show your best effort,  your nurse or respiratory therapist will set it to a desired goal.  If possible, sit up straight or lean slightly forward. Try not to slouch.  Hold the incentive spirometer in an upright position. INSTRUCTIONS FOR USE   Sit on the edge of your bed if possible, or sit up as far as you can in bed or on a chair.  Hold the incentive spirometer in an upright position.  Breathe out normally.  Place the mouthpiece in your mouth and seal your lips tightly around it.  Breathe in slowly and as deeply as possible, raising the piston or the ball toward the top of the column.  Hold your breath for 3-5 seconds or for as long as possible. Allow the piston or ball to fall to the bottom of the column.  Remove the mouthpiece from your mouth and breathe out normally.  Rest for a few seconds and repeat Steps 1 through 7 at least 10 times every 1-2 hours when you are awake. Take your time and take a few normal breaths between deep breaths.  The spirometer may include an indicator to show your best effort. Use the indicator as a goal to work toward during each repetition.  After each set of 10 deep breaths, practice coughing to be sure your lungs are clear. If you have an incision (the cut made at the time of surgery), support your incision when coughing by placing a pillow or rolled up towels firmly against it. Once you are able to get out of bed, walk around indoors and cough well. You may stop using the incentive spirometer when instructed by your caregiver.  RISKS AND COMPLICATIONS  Take your time so you do not get dizzy or light-headed.  If you are in pain, you may need to take or ask for pain medication before doing incentive spirometry. It is harder to take a deep breath if you are having pain. AFTER USE  Rest and breathe slowly and easily.  It can be helpful to keep track of a log of your progress. Your caregiver can provide you with a simple table to help with this. If you are using the  spirometer at home, follow these instructions: [place] IF:   You are having difficultly using the spirometer.  You have trouble using the spirometer as often as instructed.  Your pain medication is not giving enough relief while using the spirometer.  You develop fever of 100.5 F (38.1 C) or higher. SEEK IMMEDIATE MEDICAL CARE IF:   You cough up bloody sputum that had not been present before.  You develop fever of 102 F (38.9 C) or greater.  You develop worsening pain at or near the incision site. MAKE SURE YOU:   Understand these instructions.  Will watch your condition.  Will get help right away if you are not doing well or get worse. Document Released: 05/06/2006 Document Revised: 03/18/2011 Document Reviewed: 07/07/2006 Exit[employer] Patient Information 2014 [employer], [place].   ________________________________________________________________________  WHAT IS A BLOOD TRANSFUSION? Blood Transfusion Information  A transfusion is the replacement of blood or some of its parts. Blood is made up of multiple cells which provide different functions.  Red blood cells carry oxygen and are used for blood loss replacement.  White blood cells fight against infection.  Platelets control bleeding.  Plasma helps clot blood.  Other blood products are available for specialized needs, such as hemophilia or other clotting disorders. BEFORE THE TRANSFUSION  Who gives blood for transfusions?   Healthy volunteers who are fully evaluated to make sure their blood is safe. This is blood bank blood. Transfusion therapy is the safest it has ever been in the practice of medicine. Before blood is taken from a donor, a complete history is taken to make sure that person has no history of diseases nor engages in risky social behavior (examples are intravenous drug use or sexual activity with multiple partners). The donor's travel history is screened to minimize risk of transmitting  infections, such as malaria. The donated blood is tested for signs of infectious diseases, such as HIV and hepatitis. The blood is then tested to be sure it is compatible with you in order to minimize the chance of a transfusion reaction. If you or a relative donates blood, this is often done in anticipation of surgery and is not appropriate for emergency situations. It takes many days to process the donated blood. RISKS AND COMPLICATIONS Although transfusion therapy is very safe and saves many lives, the main dangers of transfusion include:   Getting an infectious disease.  Developing a transfusion reaction. This is an allergic reaction to something in the blood you were given. Every precaution is taken to prevent this. The decision to have a blood transfusion has been considered carefully by your caregiver before blood is given. Blood is not given unless the benefits outweigh the risks. AFTER THE TRANSFUSION  Right after receiving a blood transfusion, you will usually feel much better and more energetic. This is especially true if your red blood cells have gotten low (anemic). The transfusion raises the level of the red blood cells which carry oxygen, and this usually causes an energy increase.  The nurse administering the transfusion will monitor you carefully for complications. HOME CARE INSTRUCTIONS  No special instructions are needed after a transfusion. You may find your energy is better. Speak with your caregiver about any limitations on activity for underlying diseases you may have. SEEK MEDICAL CARE IF:   Your condition is not improving after your transfusion.  You develop redness or irritation at the intravenous (IV) site. SEEK IMMEDIATE MEDICAL CARE IF:  Any of the following symptoms occur over the  next 12 hours:  Shaking chills.  You have a temperature by mouth above 102 F (38.9 C), not controlled by medicine.  Chest, back, or muscle pain.  People around you feel you are  not acting correctly or are confused.  Shortness of breath or difficulty breathing.  Dizziness and fainting.  You get a rash or develop hives.  You have a decrease in urine output.  Your urine turns a dark color or changes to pink, red, or brown. Any of the following symptoms occur over the next 10 days:  You have a temperature by mouth above 102 F (38.9 C), not controlled by medicine.  Shortness of breath.  Weakness after normal activity.  The white part of the eye turns yellow (jaundice).  You have a decrease in the amount of urine or are urinating less often.  Your urine turns a dark color or changes to pink, red, or brown. Document Released: 12/22/1999 Document Revised: 03/18/2011 Document Reviewed: 08/10/2007 [facility] Patient Information 2014 [place], [place].  _______________________________________________________________________

## 2013-11-09 ENCOUNTER — Ambulatory Visit (HOSPITAL_COMMUNITY)
Admission: RE | Admit: 2013-11-09 | Discharge: 2013-11-09 | Disposition: A | Discharge status: Home / Self Care | Payer: Medicare Other | Source: Ambulatory Visit | Attending: Anesthesiology | Admitting: Anesthesiology

## 2013-11-09 ENCOUNTER — Encounter: Payer: Self-pay | Admitting: Obstetrics & Gynecology

## 2013-11-09 ENCOUNTER — Encounter (HOSPITAL_COMMUNITY): Payer: Self-pay

## 2013-11-09 ENCOUNTER — Ambulatory Visit (INDEPENDENT_AMBULATORY_CARE_PROVIDER_SITE_OTHER): Payer: Medicare Other | Admitting: Obstetrics & Gynecology

## 2013-11-09 ENCOUNTER — Encounter (HOSPITAL_COMMUNITY)
Admission: RE | Admit: 2013-11-09 | Discharge: 2013-11-09 | Disposition: A | Discharge status: Home / Self Care | Payer: Medicare Other | Source: Ambulatory Visit | Attending: Orthopedic Surgery | Admitting: Orthopedic Surgery

## 2013-11-09 ENCOUNTER — Other Ambulatory Visit: Payer: Self-pay | Admitting: Surgical

## 2013-11-09 VITALS — BP 142/92 | HR 80 | Resp 18 | Ht 62.25 in | Wt 162.0 lb

## 2013-11-09 DIAGNOSIS — Z01811 Encounter for preprocedural respiratory examination: Secondary | ICD-10-CM | POA: Diagnosis not present

## 2013-11-09 DIAGNOSIS — J984 Other disorders of lung: Secondary | ICD-10-CM | POA: Insufficient documentation

## 2013-11-09 DIAGNOSIS — Z853 Personal history of malignant neoplasm of breast: Secondary | ICD-10-CM | POA: Insufficient documentation

## 2013-11-09 DIAGNOSIS — Z96651 Presence of right artificial knee joint: Secondary | ICD-10-CM | POA: Insufficient documentation

## 2013-11-09 DIAGNOSIS — Z01812 Encounter for preprocedural laboratory examination: Secondary | ICD-10-CM | POA: Diagnosis not present

## 2013-11-09 DIAGNOSIS — I10 Essential (primary) hypertension: Secondary | ICD-10-CM

## 2013-11-09 DIAGNOSIS — Z01419 Encounter for gynecological examination (general) (routine) without abnormal findings: Secondary | ICD-10-CM

## 2013-11-09 HISTORY — DX: Essential (primary) hypertension: I10

## 2013-11-09 HISTORY — DX: Other allergy status, other than to drugs and biological substances: Z91.09

## 2013-11-09 HISTORY — DX: Weakness: R53.1

## 2013-11-09 HISTORY — DX: Unspecified asthma, uncomplicated: J45.909

## 2013-11-09 HISTORY — DX: Cardiac murmur, unspecified: R01.1

## 2013-11-09 HISTORY — DX: Pneumonia, unspecified organism: J18.9

## 2013-11-09 HISTORY — DX: Unspecified osteoarthritis, unspecified site: M19.90

## 2013-11-09 LAB — APTT: aPTT: 29 seconds (ref 24–37)

## 2013-11-09 LAB — URINE MICROSCOPIC-ADD ON

## 2013-11-09 LAB — CBC
HEMATOCRIT: 39.4 % (ref 36.0–46.0)
Hemoglobin: 13.4 g/dL (ref 12.0–15.0)
MCH: 32.8 pg (ref 26.0–34.0)
MCHC: 34 g/dL (ref 30.0–36.0)
MCV: 96.6 fL (ref 78.0–100.0)
PLATELETS: 298 10*3/uL (ref 150–400)
RBC: 4.08 MIL/uL (ref 3.87–5.11)
RDW: 13.1 % (ref 11.5–15.5)
WBC: 6.8 10*3/uL (ref 4.0–10.5)

## 2013-11-09 LAB — URINALYSIS, ROUTINE W REFLEX MICROSCOPIC
BILIRUBIN URINE: NEGATIVE
Glucose, UA: NEGATIVE mg/dL
HGB URINE DIPSTICK: NEGATIVE
Ketones, ur: NEGATIVE mg/dL
Nitrite: NEGATIVE
PROTEIN: NEGATIVE mg/dL
Specific Gravity, Urine: 1.025 (ref 1.005–1.030)
Urobilinogen, UA: 0.2 mg/dL (ref 0.0–1.0)
pH: 6.5 (ref 5.0–8.0)

## 2013-11-09 LAB — COMPREHENSIVE METABOLIC PANEL
ALBUMIN: 3.7 g/dL (ref 3.5–5.2)
ALT: 15 U/L (ref 0–35)
ANION GAP: 12 (ref 5–15)
AST: 20 U/L (ref 0–37)
Alkaline Phosphatase: 91 U/L (ref 39–117)
BILIRUBIN TOTAL: 0.3 mg/dL (ref 0.3–1.2)
BUN: 22 mg/dL (ref 6–23)
CALCIUM: 10.2 mg/dL (ref 8.4–10.5)
CO2: 28 mEq/L (ref 19–32)
CREATININE: 0.73 mg/dL (ref 0.50–1.10)
Chloride: 102 mEq/L (ref 96–112)
GFR calc Af Amer: >90 mL/min (ref >90)
GFR calc non Af Amer: 86 mL/min — ABNORMAL LOW (ref >90)
Glucose, Bld: 90 mg/dL (ref 70–99)
Potassium: 4 mEq/L (ref 3.7–5.3)
Sodium: 142 mEq/L (ref 137–147)
Total Protein: 7.3 g/dL (ref 6.0–8.3)

## 2013-11-09 LAB — SURGICAL PCR SCREEN
MRSA, PCR: NEGATIVE
STAPHYLOCOCCUS AUREUS: NEGATIVE

## 2013-11-09 LAB — PROTIME-INR
INR: 1.05 (ref 0.00–1.49)
PROTHROMBIN TIME: 13.8 s (ref 11.6–15.2)

## 2013-11-09 LAB — ABO/RH: ABO/RH(D): O POS

## 2013-11-09 MED ORDER — TRANEXAMIC ACID 100 MG/ML IV SOLN: 1000.0000 mg | INTRAVENOUS | Status: AC

## 2013-11-09 NOTE — H&P (Signed)
TOTAL KNEE ADMISSION H&P  Patient is being admitted for right total knee arthroplasty.  Subjective:  Chief Complaint:right knee pain.  HPI: Kelsey Mueller, 68 y.o. female, has a history of pain and functional disability in the right knee due to trauma and arthritis and has failed non-surgical conservative treatments for greater than 12 weeks to includeNSAID's and/or analgesics, corticosteriod injections, viscosupplementation injections, flexibility and strengthening excercises and activity modification.  Onset of symptoms was gradual, starting >10 years ago with rapidlly worsening course since that time due to a tibial plateau fracture. The patient noted no past surgery on the right knee(s).  Patient currently rates pain in the right knee(s) at 7 out of 10 with activity. Patient has night pain, worsening of pain with activity and weight bearing, pain that interferes with activities of daily living, pain with passive range of motion, crepitus and joint swelling.  Patient has evidence of periarticular osteophytes and joint space narrowing by imaging studies. There is no active infection.  Patient Active Problem List   Diagnosis Date Noted  . Cancer of right breast 12/14/2012  . Breast cancer 03/17/2012  . ALLERGIC RHINITIS 09/18/2008  . PALPITATIONS 08/30/2008  . CARDIAC MURMUR 08/30/2008  . DYSPNEA 08/30/2008  . ABNORMAL EKG 08/30/2008  . DEPRESSION 08/24/2008  . GERD 08/24/2008  . OSTEOPETROSIS 08/24/2008  . HYPERTENSION, HX OF 08/24/2008   Past Medical History  Diagnosis Date  . Osteoporosis   . GERD (gastroesophageal reflux disease)   . Depression   . Obesity   . Allergic rhinoconjunctivitis   . Infertility     took clomid with second pregnancy  . Polio     age 2 month old-"slight case"  . Knee injury 06/13/12    fell, cracked femur under knee cap-right  . Environmental allergies   . Hypertension   . Heart murmur     slight  . Asthma     very mild  . Pneumonia     hx of   . Migraine     hx of menstrual  . Arthritis   . Anemia     hx of  . Breast cancer     s/p XRT and lumpectomy 2009  . Left-sided weakness     left arm due to polio    Past Surgical History  Procedure Laterality Date  . Nissen fundoplication  9562  . Hiatal hernia repair  2005    with Nissen   . Breast lumpectomy  2009    R. Eston Esters   . Breast biopsy      right breast ER/PR +  . Tonsillectomy  68 years old  . Tubal ligation  1976     Current outpatient prescriptions:  acetaminophen (TYLENOL) 325 MG tablet, Take 650 mg by mouth every 6 (six) hours as needed for mild pain or headache., Disp: , Rfl: ;   CALCIUM PO, Take 1 tablet by mouth every morning., Disp: , Rfl: ;   Cholecalciferol (VITAMIN D PO), Take 1 tablet by mouth every morning. , Disp: , Rfl:  denosumab (PROLIA) 60 MG/ML SOLN injection, Inject 60 mg into the skin every 6 (six) months. Administer in upper arm, thigh, or abdomen, Disp: , Rfl: ;  fexofenadine (ALLEGRA) 180 MG tablet, Take 180 mg by mouth daily as needed for allergies. , Disp: , Rfl: ;   hydrochlorothiazide (,MICROZIDE/HYDRODIURIL,) 12.5 MG capsule, Take 12.5 mg by mouth every morning. , Disp: , Rfl:  meloxicam (MOBIC) 15 MG tablet, Take 15 mg by mouth  every morning. , Disp: , Rfl: ;   Multiple Vitamins-Minerals (CENTRUM SILVER PO), Take 1 tablet by mouth every morning. , Disp: , Rfl: ;   Omega-3 Fatty Acids (FISH OIL PO), Take 2,000 mg by mouth every morning. , Disp: , Rfl: ;   pantoprazole (PROTONIX) 40 MG tablet, Take 40 mg by mouth every morning., Disp: , Rfl:  zolpidem (AMBIEN) 5 MG tablet, Take 5 mg by mouth at bedtime as needed for sleep. , Disp: , Rfl:   No Known Allergies  History  Substance Use Topics  . Smoking status: Never Smoker   . Smokeless tobacco: Never Used  . Alcohol Use: Yes     Comment: rare    Family History  Problem Relation Age of Onset  . Coronary artery disease Neg Hx   . Breast cancer Paternal Aunt      Review of  Systems  Constitutional: Negative.   HENT: Negative.   Eyes: Negative.   Respiratory: Positive for wheezing. Negative for cough, hemoptysis, sputum production and shortness of breath.   Cardiovascular: Negative.   Gastrointestinal: Negative.   Genitourinary: Positive for frequency. Negative for dysuria, urgency, hematuria and flank pain.  Musculoskeletal: Positive for back pain and joint pain. Negative for myalgias, falls and neck pain.       Right knee pain  Skin: Negative.   Neurological: Negative.   Endo/Heme/Allergies: Positive for environmental allergies. Negative for polydipsia. Does not bruise/bleed easily.  Psychiatric/Behavioral: Negative for depression, suicidal ideas, hallucinations, memory loss and substance abuse. The patient has insomnia. The patient is not nervous/anxious.     Objective:  Physical Exam  Constitutional: She is oriented to person, place, and time. She appears well-developed and well-nourished. No distress.  HENT:  Head: Normocephalic and atraumatic.  Right Ear: External ear normal.  Left Ear: External ear normal.  Nose: Nose normal.  Mouth/Throat: Oropharynx is clear and moist.  Eyes: Conjunctivae and EOM are normal.  Neck: Normal range of motion. Neck supple.  Cardiovascular: Normal rate, regular rhythm, normal heart sounds and intact distal pulses.   No murmur heard. Respiratory: Effort normal and breath sounds normal. No respiratory distress. She has no wheezes.  GI: Soft. Bowel sounds are normal. She exhibits no distension. There is no tenderness.  Musculoskeletal:       Right hip: Normal.       Left hip: Normal.       Right knee: She exhibits decreased range of motion and swelling. She exhibits no effusion and no erythema. Tenderness found. Medial joint line and lateral joint line tenderness noted.       Left knee: Normal.       Right lower leg: She exhibits no tenderness and no swelling.       Left lower leg: She exhibits no tenderness and no  swelling.  The hips show a normal range of motion with no discomfort. The left knee shows no effusion. Range of motion is 0-135 on the left knee with no tenderness or instability. Right knee shows no effusion. There is marked crepitus on range of motion. She has a range of about 5-125. She is tender lateral and medial. There is no instability noted about the knee.  Neurological: She is alert and oriented to person, place, and time. She has normal strength and normal reflexes. No sensory deficit.  Skin: No rash noted. She is not diaphoretic. No erythema.  Psychiatric: She has a normal mood and affect. Her behavior is normal.    Vital signs  in last 24 hours: Temp:  [98 F (36.7 C)] 98 F (36.7 C) (11/03 0931) Pulse Rate:  [88] 88 (11/03 0931) Resp:  [18] 18 (11/03 0931) BP: (136)/(83) 136/83 mmHg (11/03 0931) SpO2:  [97 %] 97 % (11/03 0931) Weight:  [72.576 kg (160 lb)] 72.576 kg (160 lb) (11/03 0931)   Imaging Review Plain radiographs demonstrate severe degenerative joint disease of the right knee(s). The overall alignment ismild valgus. The bone quality appears to be good for age and reported activity level.  Assessment/Plan:  End stage arthritis, right knee   The patient history, physical examination, clinical judgment of the provider and imaging studies are consistent with end stage degenerative joint disease of the right knee(s) and total knee arthroplasty is deemed medically necessary. The treatment options including medical management, injection therapy arthroscopy and arthroplasty were discussed at length. The risks and benefits of total knee arthroplasty were presented and reviewed. The risks due to aseptic loosening, infection, stiffness, patella tracking problems, thromboembolic complications and other imponderables were discussed. The patient acknowledged the explanation, agreed to proceed with the plan and consent was signed. Patient is being admitted for inpatient treatment for  surgery, pain control, PT, OT, prophylactic antibiotics, VTE prophylaxis, progressive ambulation and ADL's and discharge planning. The patient is planning to be discharged home with home health services    TXA IV   PCP: Dr. Lang Snow   Ardeen Jourdain, PA-C

## 2013-11-09 NOTE — Progress Notes (Signed)
68 y.o. G3P3 MarriedCaucasianF here for annual exam.  No vaginal bleeding.  Having right knee replacement next week with Dr. Maureen Ralphs.  No vaginal bleeding.  Started Prolia this year.  Next dosage is due in two weeks.  She is going to wait on this until after knee surgery is done.    Patient's last menstrual period was 01/07/1997.          Sexually active: Yes.    The current method of family planning is post menopausal status.    Exercising: Yes.    working with trainer 2x/wk Smoker:  no  Health Maintenance: Pap:  09/28/12  - WNL no HPV done History of abnormal Pap:  no MMG: 09/28/13 Colonoscopy: Normal- 8 years ago f/u in 10 years BMD:   04/05/13, -1.7/-1.6 TDaP:  08/2006 Screening Labs: Done by Marton Redwood, MD   reports that she has never smoked. She has never used smokeless tobacco. She reports that she drinks alcohol. She reports that she does not use illicit drugs.  Past Medical History  Diagnosis Date  . Osteoporosis   . GERD (gastroesophageal reflux disease)   . Depression   . Obesity   . Allergic rhinoconjunctivitis   . Infertility     took clomid with second pregnancy  . Polio     age 85 month old-"slight case"  . Knee injury 06/13/12    fell, cracked femur under knee cap-right  . Environmental allergies   . Hypertension   . Heart murmur     slight  . Asthma     very mild  . Pneumonia     hx of  . Migraine     hx of menstrual  . Arthritis   . Anemia     hx of  . Breast cancer     s/p XRT and lumpectomy 2009  . Left-sided weakness     left arm due to polio    Past Surgical History  Procedure Laterality Date  . Nissen fundoplication  1245  . Hiatal hernia repair  2005    with Nissen   . Breast lumpectomy  2009    R. Eston Esters   . Breast biopsy      right breast ER/PR +  . Tonsillectomy  68 years old  . Tubal ligation  1976    Family History  Problem Relation Age of Onset  . Coronary artery disease Neg Hx   . Breast cancer Paternal Aunt     ROS:   Pertinent items are noted in HPI.  Otherwise, a comprehensive ROS was negative.  Exam:   LMP 01/07/1997      Ht Readings from Last 3 Encounters:  11/09/13 5\' 2"  (1.575 m)  12/14/12 5\' 2"  (1.575 m)  09/28/12 5\' 2"  (1.575 m)    General appearance: alert, cooperative and appears stated age Head: Normocephalic, without obvious abnormality, atraumatic Neck: no adenopathy, supple, symmetrical, trachea midline and thyroid normal to inspection and palpation Lungs: clear to auscultation bilaterally Breasts: normal appearance, no masses or tenderness, (right), left with two scars and radiation changes, no masses Heart: regular rate and rhythm Abdomen: soft, non-tender; bowel sounds normal; no masses,  no organomegaly Extremities: extremities normal, atraumatic, no cyanosis or edema Skin: Skin color, texture, turgor normal. No rashes or lesions Lymph nodes: Cervical, supraclavicular, and axillary nodes normal. No abnormal inguinal nodes palpated Neurologic: Grossly normal   Pelvic: External genitalia:  no lesions  Urethra:  normal appearing urethra with no masses, tenderness or lesions              Bartholins and Skenes: normal                 Vagina: normal appearing vagina with normal color and discharge, no lesions, atrophic              Cervix: no lesions              Pap taken: No. Bimanual Exam:  Uterus:  normal size, contour, position, consistency, mobility, non-tender              Adnexa: normal adnexa and no mass, fullness, tenderness               Rectovaginal: Confirms               Anus:  normal sphincter tone, no lesions  A:  Well Woman with normal exam H/O breast cancer 2009. "Graduatied" from Dr. Jana Hakim in ER+/PR+ 12/15. Negative genetic testing. Hypertension Strong family hx of breast cancer  P: Mammogram yearly. pap smear 2013.  No pap today. On Prolia.  Started earlier this year with Dr. Brigitte Pulse  return annually or prn  An After Visit Summary  was printed and given to the patient.

## 2013-11-15 ENCOUNTER — Inpatient Hospital Stay (HOSPITAL_COMMUNITY): Payer: Medicare Other | Admitting: Anesthesiology

## 2013-11-15 ENCOUNTER — Encounter (HOSPITAL_COMMUNITY): Payer: Self-pay | Admitting: *Deleted

## 2013-11-15 ENCOUNTER — Inpatient Hospital Stay (HOSPITAL_COMMUNITY)
Admission: RE | Admit: 2013-11-15 | Discharge: 2013-11-17 | DRG: 470 | Disposition: A | Discharge status: Home Health | Payer: Medicare Other | Source: Ambulatory Visit | Attending: Orthopedic Surgery | Admitting: Orthopedic Surgery

## 2013-11-15 ENCOUNTER — Encounter (HOSPITAL_COMMUNITY)
Admission: RE | Disposition: A | Discharge status: Home Health | Payer: Self-pay | Source: Ambulatory Visit | Attending: Orthopedic Surgery

## 2013-11-15 DIAGNOSIS — Z8612 Personal history of poliomyelitis: Secondary | ICD-10-CM

## 2013-11-15 DIAGNOSIS — M179 Osteoarthritis of knee, unspecified: Principal | ICD-10-CM | POA: Diagnosis present

## 2013-11-15 DIAGNOSIS — Z170 Estrogen receptor positive status [ER+]: Secondary | ICD-10-CM | POA: Diagnosis not present

## 2013-11-15 DIAGNOSIS — Z79899 Other long term (current) drug therapy: Secondary | ICD-10-CM | POA: Diagnosis not present

## 2013-11-15 DIAGNOSIS — K219 Gastro-esophageal reflux disease without esophagitis: Secondary | ICD-10-CM | POA: Diagnosis present

## 2013-11-15 DIAGNOSIS — Z6829 Body mass index (BMI) 29.0-29.9, adult: Secondary | ICD-10-CM

## 2013-11-15 DIAGNOSIS — E669 Obesity, unspecified: Secondary | ICD-10-CM | POA: Diagnosis present

## 2013-11-15 DIAGNOSIS — M25761 Osteophyte, right knee: Secondary | ICD-10-CM | POA: Diagnosis present

## 2013-11-15 DIAGNOSIS — M1711 Unilateral primary osteoarthritis, right knee: Secondary | ICD-10-CM

## 2013-11-15 DIAGNOSIS — Z923 Personal history of irradiation: Secondary | ICD-10-CM | POA: Diagnosis not present

## 2013-11-15 DIAGNOSIS — M25561 Pain in right knee: Secondary | ICD-10-CM | POA: Diagnosis present

## 2013-11-15 DIAGNOSIS — J45909 Unspecified asthma, uncomplicated: Secondary | ICD-10-CM | POA: Diagnosis present

## 2013-11-15 DIAGNOSIS — M1710 Unilateral primary osteoarthritis, unspecified knee: Secondary | ICD-10-CM | POA: Diagnosis present

## 2013-11-15 DIAGNOSIS — Z853 Personal history of malignant neoplasm of breast: Secondary | ICD-10-CM

## 2013-11-15 DIAGNOSIS — Z803 Family history of malignant neoplasm of breast: Secondary | ICD-10-CM

## 2013-11-15 DIAGNOSIS — I10 Essential (primary) hypertension: Secondary | ICD-10-CM | POA: Diagnosis present

## 2013-11-15 DIAGNOSIS — M810 Age-related osteoporosis without current pathological fracture: Secondary | ICD-10-CM | POA: Diagnosis present

## 2013-11-15 HISTORY — PX: TOTAL KNEE ARTHROPLASTY: SHX125

## 2013-11-15 LAB — TYPE AND SCREEN
ABO/RH(D): O POS
Antibody Screen: NEGATIVE

## 2013-11-15 SURGERY — ARTHROPLASTY, KNEE, TOTAL
Anesthesia: Spinal | Site: Knee | Laterality: Right

## 2013-11-15 MED ORDER — ONDANSETRON HCL 4 MG PO TABS: 4.0000 mg | ORAL_TABLET | Freq: Four times a day (QID) | ORAL | Status: DC | PRN

## 2013-11-15 MED ORDER — 0.9 % SODIUM CHLORIDE (POUR BTL) OPTIME
TOPICAL | Status: DC | PRN
  Administered 2013-11-15: 1000 mL

## 2013-11-15 MED ORDER — PROPOFOL 10 MG/ML IV BOLUS
INTRAVENOUS | Status: AC
  Filled 2013-11-15: qty 20

## 2013-11-15 MED ORDER — BUPIVACAINE HCL (PF) 0.75 % IJ SOLN
INTRAMUSCULAR | Status: DC | PRN
  Administered 2013-11-15: 1.8 mL

## 2013-11-15 MED ORDER — METOCLOPRAMIDE HCL 10 MG PO TABS: 5.0000 mg | ORAL_TABLET | Freq: Three times a day (TID) | ORAL | Status: DC | PRN

## 2013-11-15 MED ORDER — FLEET ENEMA 7-19 GM/118ML RE ENEM: 1.0000 | ENEMA | Freq: Once | RECTAL | Status: AC | PRN

## 2013-11-15 MED ORDER — MENTHOL 3 MG MT LOZG
1.0000 | LOZENGE | OROMUCOSAL | Status: DC | PRN
  Filled 2013-11-15: qty 9

## 2013-11-15 MED ORDER — ACETAMINOPHEN 10 MG/ML IV SOLN
1000.0000 mg | Freq: Once | INTRAVENOUS | Status: AC
  Administered 2013-11-15: 1000 mg via INTRAVENOUS
  Filled 2013-11-15: qty 100

## 2013-11-15 MED ORDER — MIDAZOLAM HCL 2 MG/2ML IJ SOLN
INTRAMUSCULAR | Status: AC
  Filled 2013-11-15: qty 2

## 2013-11-15 MED ORDER — DIPHENHYDRAMINE HCL 12.5 MG/5ML PO ELIX: 12.5000 mg | ORAL_SOLUTION | ORAL | Status: DC | PRN

## 2013-11-15 MED ORDER — ACETAMINOPHEN 500 MG PO TABS
1000.0000 mg | ORAL_TABLET | Freq: Four times a day (QID) | ORAL | Status: AC
Start: 2013-11-15 — End: 2013-11-16
  Administered 2013-11-15 – 2013-11-16 (×4): 1000 mg via ORAL
  Filled 2013-11-15 (×4): qty 2

## 2013-11-15 MED ORDER — DEXAMETHASONE SODIUM PHOSPHATE 10 MG/ML IJ SOLN
10.0000 mg | Freq: Once | INTRAMUSCULAR | Status: DC
  Filled 2013-11-15: qty 1

## 2013-11-15 MED ORDER — LORATADINE 10 MG PO TABS
10.0000 mg | ORAL_TABLET | Freq: Every day | ORAL | Status: DC
  Administered 2013-11-15 – 2013-11-16 (×2): 10 mg via ORAL
  Filled 2013-11-15 (×3): qty 1

## 2013-11-15 MED ORDER — SODIUM CHLORIDE 0.9 % IR SOLN
Status: DC | PRN
  Administered 2013-11-15: 1000 mL

## 2013-11-15 MED ORDER — ONDANSETRON HCL 4 MG/2ML IJ SOLN
INTRAMUSCULAR | Status: AC
  Filled 2013-11-15: qty 2

## 2013-11-15 MED ORDER — CEFAZOLIN SODIUM-DEXTROSE 2-3 GM-% IV SOLR
INTRAVENOUS | Status: AC
  Filled 2013-11-15: qty 50

## 2013-11-15 MED ORDER — LACTATED RINGERS IV SOLN
INTRAVENOUS | Status: DC | PRN
  Administered 2013-11-15 (×2): via INTRAVENOUS

## 2013-11-15 MED ORDER — SODIUM CHLORIDE 0.9 % IJ SOLN
INTRAMUSCULAR | Status: DC | PRN
  Administered 2013-11-15: 30 mL

## 2013-11-15 MED ORDER — ZOLPIDEM TARTRATE 5 MG PO TABS
5.0000 mg | ORAL_TABLET | Freq: Every evening | ORAL | Status: DC | PRN
  Administered 2013-11-15 – 2013-11-16 (×2): 5 mg via ORAL
  Filled 2013-11-15 (×2): qty 1

## 2013-11-15 MED ORDER — SODIUM CHLORIDE 0.9 % IV SOLN: INTRAVENOUS | Status: DC

## 2013-11-15 MED ORDER — PROMETHAZINE HCL 25 MG/ML IJ SOLN: 6.2500 mg | INTRAMUSCULAR | Status: DC | PRN

## 2013-11-15 MED ORDER — PHENOL 1.4 % MT LIQD
1.0000 | OROMUCOSAL | Status: DC | PRN
  Filled 2013-11-15: qty 177

## 2013-11-15 MED ORDER — CEFAZOLIN SODIUM-DEXTROSE 2-3 GM-% IV SOLR
2.0000 g | Freq: Four times a day (QID) | INTRAVENOUS | Status: AC
  Administered 2013-11-15 (×2): 2 g via INTRAVENOUS
  Filled 2013-11-15 (×2): qty 50

## 2013-11-15 MED ORDER — FENTANYL CITRATE 0.05 MG/ML IJ SOLN
INTRAMUSCULAR | Status: AC
  Filled 2013-11-15: qty 2

## 2013-11-15 MED ORDER — CEFAZOLIN SODIUM-DEXTROSE 2-3 GM-% IV SOLR
2.0000 g | INTRAVENOUS | Status: AC
  Administered 2013-11-15: 2 g via INTRAVENOUS

## 2013-11-15 MED ORDER — BUPIVACAINE HCL 0.25 % IJ SOLN
INTRAMUSCULAR | Status: DC | PRN
  Administered 2013-11-15: 20 mL

## 2013-11-15 MED ORDER — METHOCARBAMOL 500 MG PO TABS
500.0000 mg | ORAL_TABLET | Freq: Four times a day (QID) | ORAL | Status: DC | PRN
  Administered 2013-11-15 – 2013-11-17 (×3): 500 mg via ORAL
  Filled 2013-11-15 (×3): qty 1

## 2013-11-15 MED ORDER — PROPOFOL INFUSION 10 MG/ML OPTIME
INTRAVENOUS | Status: DC | PRN
  Administered 2013-11-15: 60 ug/kg/min via INTRAVENOUS

## 2013-11-15 MED ORDER — HYDROCHLOROTHIAZIDE 12.5 MG PO CAPS
12.5000 mg | ORAL_CAPSULE | Freq: Every day | ORAL | Status: DC
  Administered 2013-11-15 – 2013-11-17 (×3): 12.5 mg via ORAL
  Filled 2013-11-15 (×3): qty 1

## 2013-11-15 MED ORDER — TRAMADOL HCL 50 MG PO TABS: 50.0000 mg | ORAL_TABLET | Freq: Four times a day (QID) | ORAL | Status: DC | PRN

## 2013-11-15 MED ORDER — POLYETHYLENE GLYCOL 3350 17 G PO PACK: 17.0000 g | PACK | Freq: Every day | ORAL | Status: DC | PRN

## 2013-11-15 MED ORDER — ACETAMINOPHEN 650 MG RE SUPP: 650.0000 mg | Freq: Four times a day (QID) | RECTAL | Status: DC | PRN

## 2013-11-15 MED ORDER — DEXAMETHASONE SODIUM PHOSPHATE 10 MG/ML IJ SOLN
10.0000 mg | Freq: Once | INTRAMUSCULAR | Status: AC
  Administered 2013-11-15: 10 mg via INTRAVENOUS

## 2013-11-15 MED ORDER — FENTANYL CITRATE 0.05 MG/ML IJ SOLN
INTRAMUSCULAR | Status: DC | PRN
  Administered 2013-11-15: 100 ug via INTRAVENOUS

## 2013-11-15 MED ORDER — BISACODYL 10 MG RE SUPP: 10.0000 mg | Freq: Every day | RECTAL | Status: DC | PRN

## 2013-11-15 MED ORDER — DEXTROSE-NACL 5-0.45 % IV SOLN
INTRAVENOUS | Status: DC
  Administered 2013-11-15 – 2013-11-16 (×2): via INTRAVENOUS

## 2013-11-15 MED ORDER — METHOCARBAMOL 1000 MG/10ML IJ SOLN
500.0000 mg | Freq: Four times a day (QID) | INTRAVENOUS | Status: DC | PRN
  Administered 2013-11-15: 500 mg via INTRAVENOUS
  Filled 2013-11-15 (×2): qty 5

## 2013-11-15 MED ORDER — FENTANYL CITRATE 0.05 MG/ML IJ SOLN: 25.0000 ug | INTRAMUSCULAR | Status: DC | PRN

## 2013-11-15 MED ORDER — METOCLOPRAMIDE HCL 5 MG/ML IJ SOLN
5.0000 mg | Freq: Three times a day (TID) | INTRAMUSCULAR | Status: DC | PRN
  Administered 2013-11-15 (×2): 10 mg via INTRAVENOUS
  Filled 2013-11-15 (×2): qty 2

## 2013-11-15 MED ORDER — SODIUM CHLORIDE 0.9 % IJ SOLN
INTRAMUSCULAR | Status: AC
  Filled 2013-11-15: qty 50

## 2013-11-15 MED ORDER — DOCUSATE SODIUM 100 MG PO CAPS
100.0000 mg | ORAL_CAPSULE | Freq: Two times a day (BID) | ORAL | Status: DC
  Administered 2013-11-16 – 2013-11-17 (×3): 100 mg via ORAL

## 2013-11-15 MED ORDER — ACETAMINOPHEN 325 MG PO TABS
650.0000 mg | ORAL_TABLET | Freq: Four times a day (QID) | ORAL | Status: DC | PRN
  Administered 2013-11-16: 650 mg via ORAL
  Filled 2013-11-15: qty 2

## 2013-11-15 MED ORDER — BUPIVACAINE LIPOSOME 1.3 % IJ SUSP
INTRAMUSCULAR | Status: DC | PRN
  Administered 2013-11-15: 20 mL

## 2013-11-15 MED ORDER — MORPHINE SULFATE 2 MG/ML IJ SOLN: 1.0000 mg | INTRAMUSCULAR | Status: DC | PRN

## 2013-11-15 MED ORDER — ONDANSETRON HCL 4 MG/2ML IJ SOLN
4.0000 mg | Freq: Four times a day (QID) | INTRAMUSCULAR | Status: DC | PRN
  Administered 2013-11-15: 4 mg via INTRAVENOUS
  Filled 2013-11-15: qty 2

## 2013-11-15 MED ORDER — SODIUM CHLORIDE 0.9 % IV SOLN
1000.0000 mg | INTRAVENOUS | Status: AC
  Administered 2013-11-15: 1000 mg via INTRAVENOUS
  Filled 2013-11-15: qty 10

## 2013-11-15 MED ORDER — BUPIVACAINE HCL (PF) 0.25 % IJ SOLN
INTRAMUSCULAR | Status: AC
  Filled 2013-11-15: qty 30

## 2013-11-15 MED ORDER — KETOROLAC TROMETHAMINE 15 MG/ML IJ SOLN
7.5000 mg | Freq: Four times a day (QID) | INTRAMUSCULAR | Status: AC | PRN
  Administered 2013-11-15: 7.5 mg via INTRAVENOUS
  Filled 2013-11-15: qty 1

## 2013-11-15 MED ORDER — ONDANSETRON HCL 4 MG/2ML IJ SOLN
INTRAMUSCULAR | Status: DC | PRN
  Administered 2013-11-15: 4 mg via INTRAVENOUS

## 2013-11-15 MED ORDER — DEXAMETHASONE SODIUM PHOSPHATE 10 MG/ML IJ SOLN
INTRAMUSCULAR | Status: AC
  Filled 2013-11-15: qty 1

## 2013-11-15 MED ORDER — OXYCODONE HCL 5 MG PO TABS
5.0000 mg | ORAL_TABLET | ORAL | Status: DC | PRN
  Administered 2013-11-15: 10 mg via ORAL
  Administered 2013-11-15: 5 mg via ORAL
  Administered 2013-11-15 (×2): 10 mg via ORAL
  Administered 2013-11-15: 5 mg via ORAL
  Administered 2013-11-16 – 2013-11-17 (×8): 10 mg via ORAL
  Filled 2013-11-15 (×2): qty 2
  Filled 2013-11-15: qty 1
  Filled 2013-11-15 (×3): qty 2
  Filled 2013-11-15: qty 1
  Filled 2013-11-15 (×6): qty 2

## 2013-11-15 MED ORDER — MIDAZOLAM HCL 5 MG/5ML IJ SOLN
INTRAMUSCULAR | Status: DC | PRN
  Administered 2013-11-15: 2 mg via INTRAVENOUS

## 2013-11-15 MED ORDER — MEPERIDINE HCL 50 MG/ML IJ SOLN: 6.2500 mg | INTRAMUSCULAR | Status: DC | PRN

## 2013-11-15 MED ORDER — BUPIVACAINE LIPOSOME 1.3 % IJ SUSP
20.0000 mL | Freq: Once | INTRAMUSCULAR | Status: DC
Start: 2013-11-15 — End: 2013-11-15
  Filled 2013-11-15: qty 20

## 2013-11-15 MED ORDER — RIVAROXABAN 10 MG PO TABS
10.0000 mg | ORAL_TABLET | Freq: Every day | ORAL | Status: DC
  Administered 2013-11-16 – 2013-11-17 (×2): 10 mg via ORAL
  Filled 2013-11-15 (×3): qty 1

## 2013-11-15 MED ORDER — PANTOPRAZOLE SODIUM 40 MG PO TBEC
40.0000 mg | DELAYED_RELEASE_TABLET | Freq: Every day | ORAL | Status: DC
  Administered 2013-11-15 – 2013-11-17 (×3): 40 mg via ORAL
  Filled 2013-11-15 (×4): qty 1

## 2013-11-15 MED ORDER — PHENYLEPHRINE HCL 10 MG/ML IJ SOLN
INTRAMUSCULAR | Status: DC | PRN
  Administered 2013-11-15 (×3): 80 ug via INTRAVENOUS
  Administered 2013-11-15: 40 ug via INTRAVENOUS

## 2013-11-15 MED ORDER — PHENYLEPHRINE 40 MCG/ML (10ML) SYRINGE FOR IV PUSH (FOR BLOOD PRESSURE SUPPORT)
PREFILLED_SYRINGE | INTRAVENOUS | Status: AC
  Filled 2013-11-15: qty 10

## 2013-11-15 SURGICAL SUPPLY — 65 items
BAG SPEC THK2 15X12 ZIP CLS (MISCELLANEOUS)
BAG ZIPLOCK 12X15 (MISCELLANEOUS) ×1 IMPLANT
BANDAGE ELASTIC 6 VELCRO ST LF (GAUZE/BANDAGES/DRESSINGS) ×3 IMPLANT
BANDAGE ESMARK 6X9 LF (GAUZE/BANDAGES/DRESSINGS) ×1 IMPLANT
BLADE SAG 18X100X1.27 (BLADE) ×3 IMPLANT
BLADE SAW SGTL 11.0X1.19X90.0M (BLADE) ×3 IMPLANT
BNDG CMPR 9X6 STRL LF SNTH (GAUZE/BANDAGES/DRESSINGS) ×1
BNDG ESMARK 6X9 LF (GAUZE/BANDAGES/DRESSINGS) ×3
BOWL SMART MIX CTS (DISPOSABLE) ×3 IMPLANT
CAPT RP KNEE ×2 IMPLANT
CEMENT HV SMART SET (Cement) ×6 IMPLANT
CLOSURE WOUND 1/2 X4 (GAUZE/BANDAGES/DRESSINGS) ×1
CUFF TOURN SGL QUICK 34 (TOURNIQUET CUFF) ×3
CUFF TRNQT CYL 34X4X40X1 (TOURNIQUET CUFF) ×1 IMPLANT
DECANTER SPIKE VIAL GLASS SM (MISCELLANEOUS) ×3 IMPLANT
DRAPE EXTREMITY TIBURON (DRAPES) ×3 IMPLANT
DRAPE POUCH INSTRU U-SHP 10X18 (DRAPES) ×3 IMPLANT
DRAPE U-SHAPE 47X51 STRL (DRAPES) ×3 IMPLANT
DRSG ADAPTIC 3X8 NADH LF (GAUZE/BANDAGES/DRESSINGS) ×3 IMPLANT
DRSG PAD ABDOMINAL 8X10 ST (GAUZE/BANDAGES/DRESSINGS) ×3 IMPLANT
DURAPREP 26ML APPLICATOR (WOUND CARE) ×3 IMPLANT
ELECT REM PT RETURN 9FT ADLT (ELECTROSURGICAL) ×3
ELECTRODE REM PT RTRN 9FT ADLT (ELECTROSURGICAL) ×1 IMPLANT
EVACUATOR 1/8 PVC DRAIN (DRAIN) ×3 IMPLANT
FACESHIELD WRAPAROUND (MASK) ×12 IMPLANT
FACESHIELD WRAPAROUND OR TEAM (MASK) ×5 IMPLANT
GAUZE SPONGE 4X4 12PLY STRL (GAUZE/BANDAGES/DRESSINGS) ×3 IMPLANT
GLOVE BIO SURGEON STRL SZ8 (GLOVE) ×3 IMPLANT
GLOVE BIOGEL PI IND STRL 6.5 (GLOVE) IMPLANT
GLOVE BIOGEL PI IND STRL 7.5 (GLOVE) IMPLANT
GLOVE BIOGEL PI IND STRL 8 (GLOVE) ×1 IMPLANT
GLOVE BIOGEL PI INDICATOR 6.5 (GLOVE) ×2
GLOVE BIOGEL PI INDICATOR 7.5 (GLOVE) ×2
GLOVE BIOGEL PI INDICATOR 8 (GLOVE) ×4
GLOVE ECLIPSE 7.5 STRL STRAW (GLOVE) ×2 IMPLANT
GLOVE SURG SS PI 6.5 STRL IVOR (GLOVE) ×2 IMPLANT
GLOVE SURG SS PI 7.5 STRL IVOR (GLOVE) ×2 IMPLANT
GOWN BRE IMP PREV XXLGXLNG (GOWN DISPOSABLE) ×2 IMPLANT
GOWN SPEC L3 XXLG W/TWL (GOWN DISPOSABLE) ×2 IMPLANT
GOWN STRL REUS W/TWL LRG LVL3 (GOWN DISPOSABLE) ×5 IMPLANT
HANDPIECE INTERPULSE COAX TIP (DISPOSABLE) ×3
IMMOBILIZER KNEE 20 (SOFTGOODS) ×3
IMMOBILIZER KNEE 20 THIGH 36 (SOFTGOODS) ×1 IMPLANT
KIT BASIN OR (CUSTOM PROCEDURE TRAY) ×3 IMPLANT
MANIFOLD NEPTUNE II (INSTRUMENTS) ×3 IMPLANT
NDL SAFETY ECLIPSE 18X1.5 (NEEDLE) ×2 IMPLANT
NEEDLE HYPO 18GX1.5 SHARP (NEEDLE) ×6
NS IRRIG 1000ML POUR BTL (IV SOLUTION) ×3 IMPLANT
PACK TOTAL JOINT (CUSTOM PROCEDURE TRAY) ×3 IMPLANT
PADDING CAST COTTON 6X4 STRL (CAST SUPPLIES) ×4 IMPLANT
POSITIONER SURGICAL ARM (MISCELLANEOUS) ×3 IMPLANT
SET HNDPC FAN SPRY TIP SCT (DISPOSABLE) ×1 IMPLANT
STRIP CLOSURE SKIN 1/2X4 (GAUZE/BANDAGES/DRESSINGS) ×3 IMPLANT
SUCTION FRAZIER 12FR DISP (SUCTIONS) ×3 IMPLANT
SUT ETHILON 4 0 PS 2 18 (SUTURE) ×2 IMPLANT
SUT MNCRL AB 4-0 PS2 18 (SUTURE) ×3 IMPLANT
SUT VIC AB 2-0 CT1 27 (SUTURE) ×9
SUT VIC AB 2-0 CT1 TAPERPNT 27 (SUTURE) ×3 IMPLANT
SUT VLOC 180 0 24IN GS25 (SUTURE) ×3 IMPLANT
SYR 20CC LL (SYRINGE) ×3 IMPLANT
SYR 50ML LL SCALE MARK (SYRINGE) ×3 IMPLANT
TOWEL OR 17X26 10 PK STRL BLUE (TOWEL DISPOSABLE) ×3 IMPLANT
TRAY FOLEY CATH 14FRSI W/METER (CATHETERS) ×3 IMPLANT
WATER STERILE IRR 1500ML POUR (IV SOLUTION) ×3 IMPLANT
WRAP KNEE MAXI GEL POST OP (GAUZE/BANDAGES/DRESSINGS) ×3 IMPLANT

## 2013-11-15 NOTE — Plan of Care (Signed)
Problem: Consults Goal: Total Joint Replacement Patient Education See Patient Education Module for education specifics. Outcome: Progressing Goal: Diagnosis- Total Joint Replacement Primary Total Knee Goal: Skin Care Protocol Initiated - if Braden Score 18 or less If consults are not indicated, leave blank or document N/A Outcome: Progressing Goal: Nutrition Consult-if indicated Outcome: Not Applicable Date Met:  38/18/40 Goal: Diabetes Guidelines if Diabetic/Glucose > 140 If diabetic or lab glucose is > 140 mg/dl - Initiate Diabetes/Hyperglycemia Guidelines & Document Interventions  Outcome: Not Applicable Date Met:  37/54/36  Problem: Phase I Progression Outcomes Goal: CMS/Neurovascular status WDL Outcome: Progressing Goal: Pain controlled with appropriate interventions Outcome: Progressing

## 2013-11-15 NOTE — Interval H&P Note (Signed)
History and Physical Interval Note:  86/03/8175 1:16 AM  Kelsey Mueller  has presented today for surgery, with the diagnosis of right knee osteoarthritis  The various methods of treatment have been discussed with the patient and family. After consideration of risks, benefits and other options for treatment, the patient has consented to  Procedure(s): RIGHT TOTAL KNEE ARTHROPLASTY (Right) as a surgical intervention .  The patient's history has been reviewed, patient examined, no change in status, stable for surgery.  I have reviewed the patient's chart and labs.  Questions were answered to the patient's satisfaction.     Gearlean Alf

## 2013-11-15 NOTE — Op Note (Signed)
Pre-operative diagnosis- Osteoarthritis  Right knee(s)  Post-operative diagnosis- Osteoarthritis Right knee(s)  Procedure-  Right  Total Knee Arthroplasty  Surgeon- Kelsey Mueller. Kelsey Carelli, MD  Assistant- Kelsey Jourdain, PA-C   Anesthesia-  Spinal  EBL-* No blood loss amount entered *   Drains Hemovac  Tourniquet time-  Total Tourniquet Time Documented: Thigh (Right) - 35 minutes Total: Thigh (Right) - 35 minutes     Complications- None  Condition-PACU - hemodynamically stable.   Brief Clinical Note  Kelsey Mueller is a 68 y.o. year old female with end stage OA of her right knee with progressively worsening pain and dysfunction. She has constant pain, with activity and at rest and significant functional deficits with difficulties even with ADLs. She has had extensive non-op management including analgesics, injections of cortisone and viscosupplements, and home exercise program, but remains in significant pain with significant dysfunction.Radiographs show bone on bone arthritis patellofemoral. She presents now for right Total Knee Arthroplasty.    Procedure in detail---   The patient is brought into the operating room and positioned supine on the operating table. After successful administration of  Spinal,   a tourniquet is placed high on the  Right thigh(s) and the lower extremity is prepped and draped in the usual sterile fashion. Time out is performed by the operating team and then the  Right lower extremity is wrapped in Esmarch, knee flexed and the tourniquet inflated to 300 mmHg.       A midline incision is made with a ten blade through the subcutaneous tissue to the level of the extensor mechanism. A fresh blade is used to make a medial parapatellar arthrotomy. Soft tissue over the proximal medial tibia is subperiosteally elevated to the joint line with a knife and into the semimembranosus bursa with a Cobb elevator. Soft tissue over the proximal lateral tibia is elevated with  attention being paid to avoiding the patellar tendon on the tibial tubercle. The patella is everted, knee flexed 90 degrees and the ACL and PCL are removed. Findings are bone on bone patellofemoral with exposed bone medial compartment.        The drill is used to create a starting hole in the distal femur and the canal is thoroughly irrigated with sterile saline to remove the fatty contents. The 5 degree Right  valgus alignment guide is placed into the femoral canal and the distal femoral cutting block is pinned to remove 10 mm off the distal femur. Resection is made with an oscillating saw.      The tibia is subluxed forward and the menisci are removed. The extramedullary alignment guide is placed referencing proximally at the medial aspect of the tibial tubercle and distally along the second metatarsal axis and tibial crest. The block is pinned to remove 63mm off the more deficient medial  side. Resection is made with an oscillating saw. Size 2is the most appropriate size for the tibia and the proximal tibia is prepared with the modular drill and keel punch for that size.      The femoral sizing guide is placed and size 2.5 is most appropriate. Rotation is marked off the epicondylar axis and confirmed by creating a rectangular flexion gap at 90 degrees. The size 2.5 cutting block is pinned in this rotation and the anterior, posterior and chamfer cuts are made with the oscillating saw. The intercondylar block is then placed and that cut is made.      Trial size 2 tibial component, trial size 2.5 posterior  stabilized femur and a 12.5  mm posterior stabilized rotating platform insert trial is placed. Full extension is achieved with excellent varus/valgus and anterior/posterior balance throughout full range of motion. The patella is everted and thickness measured to be 20  mm. Free hand resection is taken to 11 mm, a 32 template is placed, lug holes are drilled, trial patella is placed, and it tracks normally.  Osteophytes are removed off the posterior femur with the trial in place. All trials are removed and the cut bone surfaces prepared with pulsatile lavage. Cement is mixed and once ready for implantation, the size 2 tibial implant, size  2.5 posterior stabilized femoral component, and the size 32 patella are cemented in place and the patella is held with the clamp. The trial insert is placed and the knee held in full extension. The Exparel (20 ml mixed with 30 ml saline) and .25% Bupivicaine, are injected into the extensor mechanism, posterior capsule, medial and lateral gutters and subcutaneous tissues.  All extruded cement is removed and once the cement is hard the permanent 12.5 mm posterior stabilized rotating platform insert is placed into the tibial tray.      The wound is copiously irrigated with saline solution and the extensor mechanism closed over a hemovac drain with #1 V-loc suture. The tourniquet is released for a total tourniquet time of 35  minutes. Flexion against gravity is 140 degrees and the patella tracks normally. Subcutaneous tissue is closed with 2.0 vicryl and subcuticular with running 4.0 Monocryl. The incision is cleaned and dried and steri-strips and a bulky sterile dressing are applied. The limb is placed into a knee immobilizer and the patient is awakened and transported to recovery in stable condition.      Please note that a surgical assistant was a medical necessity for this procedure in order to perform it in a safe and expeditious manner. Surgical assistant was necessary to retract the ligaments and vital neurovascular structures to prevent injury to them and also necessary for proper positioning of the limb to allow for anatomic placement of the prosthesis.   Kelsey Mueller Kelsey Primeau, MD    11/15/2013, 8:14 AM

## 2013-11-15 NOTE — Progress Notes (Signed)
Utilization review completed.  

## 2013-11-15 NOTE — Plan of Care (Signed)
Problem: Phase I Progression Outcomes Goal: Pain controlled with appropriate interventions Outcome: Completed/Met Date Met:  11/15/13     

## 2013-11-15 NOTE — Progress Notes (Signed)
   11/15/13 1649  PT Visit Information  Last PT Received On 11/15/13  Assistance Needed +1  History of Present Illness s/p RTKA  PT Time Calculation  PT Start Time 1420  PT Stop Time 1432  PT Time Calculation (min) 12 min  Subjective Data  Subjective pt requesting to goback to bed  Patient Stated Goal return to I  Precautions  Required Braces or Orthoses Knee Immobilizer - Right  Knee Immobilizer - Right Discontinue once straight leg raise with < 10 degree lag  Restrictions  Other Position/Activity Restrictions WBAT  Pain Assessment  Pain Assessment 0-10  Pain Score 3  Pain Location R knee  Pain Descriptors / Indicators Constant  Pain Intervention(s) Ice applied;Repositioned  Cognition  Arousal/Alertness Awake/alert  Behavior During Therapy WFL for tasks assessed/performed  Overall Cognitive Status Within Functional Limits for tasks assessed  Bed Mobility  Overal bed mobility Needs Assistance  Bed Mobility Sit to Supine  Sit to supine Min assist  General bed mobility comments cues for technique  Transfers  Overall transfer level Needs assistance  Equipment used Rolling walker (2 wheeled)  Transfers Sit to/from [employer] Transfers  Sit to Stand Min assist;Min guard  Stand pivot transfers Min guard;Min assist  General transfer comment verbal cues for hand placement and RLE position  Total Joint Exercises  Ankle Circles/Pumps AROM;Both;10 reps  [employer] Strengthening;10 reps;Both  Heel Slides AAROM;Right;10 reps  PT - End of Session  Equipment Utilized During Treatment Gait belt  Activity Tolerance Patient tolerated treatment well  Patient left in bed;with call bell/phone within reach;with family/visitor present  Nurse Communication Mobility status  PT - Assessment/Plan  PT Plan Current plan remains appropriate  PT Frequency 7X/week  Follow Up Recommendations Home health PT  PT equipment Rolling walker with 5" wheels  PT Goal Progression  Progress towards  PT goals Progressing toward goals  Acute Rehab PT Goals  PT Goal Formulation With patient  Time For Goal Achievement 11/22/13  Potential to Achieve Goals Good  PT General Charges  $$ ACUTE PT VISIT 1 Procedure  PT Treatments  $Therapeutic Exercise 8-22 mins

## 2013-11-15 NOTE — Plan of Care (Signed)
Problem: Phase I Progression Outcomes Goal: CMS/Neurovascular status WDL Outcome: Completed/Met Date Met:  11/15/13

## 2013-11-15 NOTE — Transfer of Care (Signed)
Immediate Anesthesia Transfer of Care Note  Patient: Kelsey Mueller  Procedure(s) Performed: Procedure(s): RIGHT TOTAL KNEE ARTHROPLASTY (Right)  Patient Location: PACU  Anesthesia Type:Spinal  Level of Consciousness: sedated  Airway & Oxygen Therapy: Patient Spontanous Breathing and Patient connected to face mask oxygen  Post-op Assessment: Report given to PACU RN and Post -op Vital signs reviewed and stable  Post vital signs: Reviewed and stable  Complications: No apparent anesthesia complications

## 2013-11-15 NOTE — Plan of Care (Signed)
Problem: Phase I Progression Outcomes Goal: Hemodynamically stable Outcome: Completed/Met Date Met:  11/15/13     

## 2013-11-15 NOTE — Anesthesia Postprocedure Evaluation (Signed)
  Anesthesia Post-op Note  Patient: Kelsey Mueller  Procedure(s) Performed: Procedure(s): RIGHT TOTAL KNEE ARTHROPLASTY (Right)  Patient Location: PACU  Anesthesia Type:MAC and Spinal  Level of Consciousness: awake and alert   Airway and Oxygen Therapy: Patient Spontanous Breathing and Patient connected to nasal cannula oxygen  Post-op Pain: none  Post-op Assessment: Post-op Vital signs reviewed, Patient's Cardiovascular Status Stable, Respiratory Function Stable and Patent Airway  Post-op Vital Signs: Reviewed and stable  Last Vitals:  Filed Vitals:   11/15/13 0842  BP: 113/68  Pulse: 62  Temp: 36.4 C  Resp: 16    Complications: No apparent anesthesia complications

## 2013-11-15 NOTE — Anesthesia Preprocedure Evaluation (Addendum)
Anesthesia Evaluation  Patient identified by MRN, date of birth, ID band Patient awake    Reviewed: Allergy & Precautions, H&P , NPO status , Patient's Chart, lab work & pertinent test results  Airway Mallampati: II   Neck ROM: Full    Dental  (+) Dental Advisory Given   Pulmonary asthma ,  breath sounds clear to auscultation        Cardiovascular hypertension, Pt. on medications Rhythm:Regular     Neuro/Psych Depression    GI/Hepatic   Endo/Other    Renal/GU      Musculoskeletal   Abdominal (+)  Abdomen: soft.    Peds  Hematology   Anesthesia Other Findings   Reproductive/Obstetrics                            Anesthesia Physical Anesthesia Plan  ASA: III  Anesthesia Plan: General/Spinal   Post-op Pain Management:    Induction: Intravenous  Airway Management Planned:   Additional Equipment:   Intra-op Plan:   Post-operative Plan: Extubation in OR  Informed Consent: I have reviewed the patients History and Physical, chart, labs and discussed the procedure including the risks, benefits and alternatives for the proposed anesthesia with the patient or authorized representative who has indicated his/her understanding and acceptance.     Plan Discussed with:   Anesthesia Plan Comments:         Anesthesia Quick Evaluation

## 2013-11-15 NOTE — Plan of Care (Signed)
Problem: Phase I Progression Outcomes Goal: Dangle or out of bed evening of surgery Outcome: Completed/Met Date Met:  11/15/13     

## 2013-11-15 NOTE — Evaluation (Signed)
Physical Therapy Evaluation Patient Details Name: Kelsey Mueller MRN: [id number] DOB: 11-26-1945 Today's Date: 11/15/2013   History of Present Illness  s/p RTKA  Clinical Impression  Pt will benefit from PT to address deficits below; plan is for home with HHPT, will need RW for home as well    Follow Up Recommendations Home health PT    Equipment Recommendations  Rolling walker with 5" wheels (may need youth)    Recommendations for Other Services       Precautions / Restrictions Precautions Precautions: Knee Required Braces or Orthoses: Knee Immobilizer - Right Knee Immobilizer - Right: Discontinue once straight leg raise with < 10 degree lag      Mobility  Bed Mobility Overal bed mobility: Needs Assistance Bed Mobility: Supine to Sit     Supine to sit: Min assist     General bed mobility comments: cues for technique  Transfers Overall transfer level: Needs assistance Equipment used: Rolling walker (2 wheeled) Transfers: Sit to/from Stand           General transfer comment: verbal cues for hand placement and RLE position  Ambulation/Gait Ambulation/Gait assistance: Min assist Ambulation Distance (Feet): 30 Feet Assistive device: Rolling walker (2 wheeled) Gait Pattern/deviations: Step-to pattern;Decreased step length - right;Decreased step length - left   Gait velocity interpretation: Below normal speed for age/gender General Gait Details: verbal cues for step length, RW position and sequencing  Stairs            Wheelchair Mobility    Modified Rankin (Stroke Patients Only)       Balance                                             Pertinent Vitals/Pain Pain Assessment: 0-10 Pain Score: 2  Pain Location: right knee Pain Descriptors / Indicators: Constant Pain Intervention(s): Limited activity within patient's tolerance;Monitored during session;Premedicated before session;Ice applied    Home Living Family/patient  expects to be discharged to:: Private residence Living Arrangements: Spouse/significant other   Type of Home: House (townhouse) Home Access: Stairs to enter   Technical brewer of Steps: 4-5 Home Layout: Two level Home Equipment: None      Prior Function Level of Independence: Independent               Hand Dominance        Extremity/Trunk Assessment   Upper Extremity Assessment: Defer to OT evaluation           Lower Extremity Assessment: RLE deficits/detail RLE Deficits / Details: knee extension and hip flexion 2+/5, ankle WFL       Communication   Communication: No difficulties  Cognition Arousal/Alertness: Awake/alert Behavior During Therapy: WFL for tasks assessed/performed Overall Cognitive Status: Within Functional Limits for tasks assessed                      General Comments      Exercises Total Joint Exercises Ankle Circles/Pumps: AROM;Both;5 reps Quad Sets: 5 reps;Both;AROM;Strengthening      Assessment/Plan    PT Assessment Patient needs continued PT services  PT Diagnosis Difficulty walking   PT Problem List Decreased range of motion;Decreased activity tolerance;Decreased mobility;Decreased knowledge of use of DME  PT Treatment Interventions Functional mobility training;Therapeutic activities;Therapeutic exercise;Stair training;Gait training;DME instruction;Patient/family education   PT Goals (Current goals can be found in the Care Plan section)  Acute Rehab PT Goals Patient Stated Goal: return to I PT Goal Formulation: With patient Time For Goal Achievement: 11/22/13 Potential to Achieve Goals: Good    Frequency 7X/week   Barriers to discharge        Co-evaluation               End of Session Equipment Utilized During Treatment: Gait belt Activity Tolerance: Patient tolerated treatment well Patient left: in chair;with call bell/phone within reach;with family/visitor present Nurse Communication: Mobility  status         Time: 3825-0539 PT Time Calculation (min): 24 min   Charges:   PT Evaluation $Initial PT Evaluation Tier I: 1 Procedure PT Treatments $Gait Training: 8-22 mins $Therapeutic Activity: 8-22 mins   PT G Codes:          Virga Haltiwanger 12/15/13, 4:46 PM

## 2013-11-15 NOTE — Anesthesia Procedure Notes (Signed)
Spinal Patient location during procedure: OR Start time: 11/15/2013 7:12 AM End time: 11/15/2013 7:15 AM Staffing Resident/CRNA: Harle Stanford R Performed by: resident/CRNA  Preanesthetic Checklist Completed: patient identified, site marked, surgical consent, pre-op evaluation, timeout performed, IV checked, risks and benefits discussed and monitors and equipment checked Spinal Block Patient position: sitting Prep: Betadine Patient monitoring: heart rate, cardiac monitor, continuous pulse ox and blood pressure Approach: midline Location: L3-4 Injection technique: single-shot Needle Needle type: Sprotte  Needle gauge: 24 G Needle length: 9 cm Needle insertion depth: 5 cm Assessment Sensory level: T6

## 2013-11-16 LAB — CBC
HCT: 31.4 % — ABNORMAL LOW (ref 36.0–46.0)
Hemoglobin: 10.9 g/dL — ABNORMAL LOW (ref 12.0–15.0)
MCH: 32.7 pg (ref 26.0–34.0)
MCHC: 34.7 g/dL (ref 30.0–36.0)
MCV: 94.3 fL (ref 78.0–100.0)
PLATELETS: 266 10*3/uL (ref 150–400)
RBC: 3.33 MIL/uL — ABNORMAL LOW (ref 3.87–5.11)
RDW: 12.9 % (ref 11.5–15.5)
WBC: 9.2 10*3/uL (ref 4.0–10.5)

## 2013-11-16 LAB — BASIC METABOLIC PANEL
Anion gap: 13 (ref 5–15)
BUN: 12 mg/dL (ref 6–23)
CALCIUM: 8.8 mg/dL (ref 8.4–10.5)
CO2: 25 mEq/L (ref 19–32)
CREATININE: 0.69 mg/dL (ref 0.50–1.10)
Chloride: 97 mEq/L (ref 96–112)
GFR calc non Af Amer: 87 mL/min — ABNORMAL LOW (ref >90)
Glucose, Bld: 132 mg/dL — ABNORMAL HIGH (ref 70–99)
Potassium: 3.6 mEq/L — ABNORMAL LOW (ref 3.7–5.3)
Sodium: 135 mEq/L — ABNORMAL LOW (ref 137–147)

## 2013-11-16 MED ORDER — TRAMADOL HCL 50 MG PO TABS: 50.0000 mg | ORAL_TABLET | Freq: Four times a day (QID) | ORAL | Status: DC | PRN

## 2013-11-16 MED ORDER — RIVAROXABAN 10 MG PO TABS: 10.0000 mg | ORAL_TABLET | Freq: Every day | ORAL | Status: DC

## 2013-11-16 MED ORDER — OXYCODONE HCL 5 MG PO TABS: 5.0000 mg | ORAL_TABLET | ORAL | Status: DC | PRN

## 2013-11-16 MED ORDER — METHOCARBAMOL 500 MG PO TABS: 500.0000 mg | ORAL_TABLET | Freq: Four times a day (QID) | ORAL | Status: DC | PRN

## 2013-11-16 NOTE — Plan of Care (Signed)
Problem: Phase III Progression Outcomes Goal: Ambulates Outcome: Completed/Met Date Met:  11/16/13 Goal: Incision clean - minimal/no drainage Outcome: Completed/Met Date Met:  11/16/13 Goal: Discharge plan remains appropriate-arrangements made Outcome: Progressing

## 2013-11-16 NOTE — Evaluation (Signed)
Occupational Therapy Evaluation Patient Details Name: Kelsey Mueller MRN: [id number] DOB: [birth year] Today's Date: 11/16/2013    History of Present Illness s/p RTKA   Clinical Impression   Pt admitted with the above diagnosis and is doing well with her adls overall.  Pt would benefit from one more session of OT to ensure safety with a shower transfer and LE dressing so she can d/c home safely with her husband.     Follow Up Recommendations  Home health OT;Supervision/Assistance - 24 hour    Equipment Recommendations  3 in 1 bedside comode    Recommendations for Other Services       Precautions / Restrictions Precautions Precautions: Knee Required Braces or Orthoses: Knee Immobilizer - Right Knee Immobilizer - Right: Discontinue once straight leg raise with < 10 degree lag Restrictions Weight Bearing Restrictions: No Other Position/Activity Restrictions: WBAT      Mobility Bed Mobility Overal bed mobility: Modified Independent Bed Mobility: Supine to Sit     Supine to sit: Modified independent (Device/Increase time)     General bed mobility comments: Pt needed no assist today but did need extra time.  Transfers Overall transfer level: Needs assistance Equipment used: Rolling walker (2 wheeled) Transfers: Sit to/from Stand Sit to Stand: Supervision         General transfer comment: verbal cues for hand placement and RLE position    Balance Overall balance assessment: Needs assistance Sitting-balance support: No upper extremity supported;Feet supported Sitting balance-Leahy Scale: Normal     Standing balance support: Bilateral upper extremity supported;During functional activity Standing balance-Leahy Scale: Good Standing balance comment: Pt able to stand at sink w/o assist to groom w no challenges.                            ADL Overall ADL's : Needs assistance/impaired Eating/Feeding: Independent   Grooming: Wash/dry hands;Oral care;Set  up;Supervision/safety;Standing   Upper Body Bathing: Set up;Sitting   Lower Body Bathing: Minimal assistance;Sit to/from stand Lower Body Bathing Details (indicate cue type and reason): assist to get to R foot Upper Body Dressing : Set up;Sitting   Lower Body Dressing: Minimal assistance;Sit to/from stand Lower Body Dressing Details (indicate cue type and reason): assist with R sock and shoe. Toilet Transfer: Min guard;Cueing for safety;Comfort height toilet Armed forces technical officer Details (indicate cue type and reason): cues for hand placement and to kick foot out while sitting for comfort. Toileting- Water quality scientist and Hygiene: Min guard;Sit to/from stand Toileting - Clothing Manipulation Details (indicate cue type and reason): for balance when letting go of walker.     Functional mobility during ADLs: Supervision/safety;Rolling walker General ADL Comments: Pt did very well with adls this morning.  pt needs assist getting to R foot for all adls.  Husband will be around to assist.at all times.     Vision                     Perception     Praxis      Pertinent Vitals/Pain Pain Assessment: 0-10 Pain Score: 3  Pain Location: R knee Pain Descriptors / Indicators: Aching Pain Intervention(s): Monitored during session;Premedicated before session;Repositioned;Ice applied     Hand Dominance Right   Extremity/Trunk Assessment Upper Extremity Assessment Upper Extremity Assessment: Overall WFL for tasks assessed   Lower Extremity Assessment Lower Extremity Assessment: Defer to PT evaluation   Cervical / Trunk Assessment Cervical / Trunk Assessment: Normal   Communication Communication  Communication: No difficulties   Cognition Arousal/Alertness: Awake/alert Behavior During Therapy: WFL for tasks assessed/performed Overall Cognitive Status: Within Functional Limits for tasks assessed                     General Comments       Exercises       Shoulder  Instructions      Home Living Family/patient expects to be discharged to:: Private residence Living Arrangements: Spouse/significant other Available Help at Discharge: Family;Available 24 hours/day Type of Home: House Home Access: Stairs to enter [employer] of Steps: 4-5 Entrance Stairs-Rails: Right Home Layout: Able to live on main level with bedroom/bathroom;Two level     Bathroom Shower/Tub: Walk-in shower;Door   [employer] Toilet: Standard Bathroom Accessibility: Yes How Accessible: Accessible via walker Home Equipment: None          Prior Functioning/Environment Level of Independence: Independent             OT Diagnosis: Generalized weakness   OT Problem List: Impaired balance (sitting and/or standing);Decreased knowledge of use of DME or AE   OT Treatment/Interventions: Self-care/ADL training;Therapeutic activities    OT Goals(Current goals can be found in the care plan section) Acute Rehab OT Goals Patient Stated Goal: return to I OT Goal Formulation: With patient Time For Goal Achievement: 11/23/13 Potential to Achieve Goals: Good ADL Goals Pt Will Perform Upper Body Dressing: with min assist;with caregiver independent in assisting;standing Pt Will Perform Tub/Shower Transfer: with supervision;shower seat;ambulating;Shower transfer;rolling walker  OT Frequency: Min 2X/week   Barriers to D/C:            Co-evaluation              End of Session Equipment Utilized During Treatment: Rolling walker;Right knee immobilizer Nurse Communication: Mobility status  Activity Tolerance: Patient tolerated treatment well Patient left: in chair;with call bell/phone within reach;with family/visitor present   Time: 8032-1224 OT Time Calculation (min): 36 min Charges:  OT General Charges $OT Visit: 1 Procedure OT Evaluation $Initial OT Evaluation Tier I: 1 Procedure OT Treatments $Self Care/Home Management : 23-37 mins G-Codes:    Glenford Peers Dec 08, 2013, 10:09 AM  [phone]

## 2013-11-16 NOTE — Care Management Note (Signed)
    Page 1 of 2   11/16/2013     11:33:58 AM CARE MANAGEMENT NOTE 11/16/2013  Patient:  Kelsey Mueller, Kelsey Mueller   Account Number:  [id number]  Date Initiated:  11/16/2013  Documentation initiated by:  [facility]  Subjective/Objective Assessment:   adm: RIGHT TOTAL KNEE ARTHROPLASTY (Right)     Action/Plan:   discharge planning   Anticipated DC Date:  11/16/2013   Anticipated DC Plan:  [place]  CM consult      [facility] Choice  HOME HEALTH   Choice offered to / List presented to:  C-1 Patient   DME arranged  3-N-1  Vassie Moselle      DME agency  [place] arranged  [place]   Status of service:  Completed, signed off Medicare Important Message given?   (If response is "NO", the following Medicare IM given date fields will be blank) Date Medicare IM given:   Medicare IM given by:   Date Additional Medicare IM given:   Additional Medicare IM given by:    Discharge Disposition:  [place]  Per UR Regulation:    If discussed at Long Length of Stay Meetings, dates discussed:    Comments:  11/16/13 11:30 CM met with pt in room to offer choice of home health agency.  [facility] chooses [facility] to render HHPT. address and contact information veriifed with pt.  Cm called [place] DME rep to please deliver 3n1 and rolling walker to room prior to discharge.  Referral called to Shaune Leeks.

## 2013-11-16 NOTE — Discharge Summary (Signed)
Physician Discharge Summary   Patient ID: Kelsey Mueller MRN: [id number] DOB/AGE: 08/05/1945 68 y.o.  Admit date: 11/15/2013 Discharge date: 11/17/2013  Primary Diagnosis:  Osteoarthritis Right knee(s) Admission Diagnoses:  Past Medical History  Diagnosis Date  . Osteoporosis   . GERD (gastroesophageal reflux disease)   . Depression   . Obesity   . Allergic rhinoconjunctivitis   . Infertility     took clomid with second pregnancy  . Polio     age 76 month old-"slight case"  . Knee injury 06/13/12    fell, cracked femur under knee cap-right  . Environmental allergies   . Hypertension   . Heart murmur     slight  . Asthma     very mild  . Pneumonia     hx of  . Migraine     hx of menstrual  . Arthritis   . Anemia     hx of  . Breast cancer     s/p XRT and lumpectomy 2009  . Left-sided weakness     left arm due to polio   Discharge Diagnoses:   Active Problems:   OA (osteoarthritis) of knee  Estimated body mass index is 29.62 kg/(m^2) as calculated from the following:   Height as of this encounter: 5' 2"  (1.575 m).   Weight as of this encounter: 73.483 kg (162 lb).  Procedure:  Procedure(s) (LRB): RIGHT TOTAL KNEE ARTHROPLASTY (Right)   Consults: None  HPI: Kelsey Mueller is a 68 y.o. year old female with end stage OA of her right knee with progressively worsening pain and dysfunction. She has constant pain, with activity and at rest and significant functional deficits with difficulties even with ADLs. She has had extensive non-op management including analgesics, injections of cortisone and viscosupplements, and home exercise program, but remains in significant pain with significant dysfunction.Radiographs show bone on bone arthritis patellofemoral. She presents now for right Total Knee Arthroplasty.   Laboratory Data: Admission on 11/15/2013, Discharged on 11/17/2013  Component Date Value Ref Range Status  . WBC 11/16/2013 9.2  4.0 - 10.5 K/uL Final  . RBC  11/16/2013 3.33* 3.87 - 5.11 MIL/uL Final  . Hemoglobin 11/16/2013 10.9* 12.0 - 15.0 g/dL Final  . HCT 11/16/2013 31.4* 36.0 - 46.0 % Final  . MCV 11/16/2013 94.3  78.0 - 100.0 fL Final  . MCH 11/16/2013 32.7  26.0 - 34.0 pg Final  . MCHC 11/16/2013 34.7  30.0 - 36.0 g/dL Final  . RDW 11/16/2013 12.9  11.5 - 15.5 % Final  . Platelets 11/16/2013 266  150 - 400 K/uL Final  . Sodium 11/16/2013 135* 137 - 147 mEq/L Final  . Potassium 11/16/2013 3.6* 3.7 - 5.3 mEq/L Final  . Chloride 11/16/2013 97  96 - 112 mEq/L Final  . CO2 11/16/2013 25  19 - 32 mEq/L Final  . Glucose, Bld 11/16/2013 132* 70 - 99 mg/dL Final  . BUN 11/16/2013 12  6 - 23 mg/dL Final  . Creatinine, Ser 11/16/2013 0.69  0.50 - 1.10 mg/dL Final  . Calcium 11/16/2013 8.8  8.4 - 10.5 mg/dL Final  . GFR calc non Af Amer 11/16/2013 87* >90 mL/min Final  . GFR calc Af Amer 11/16/2013 >90  >90 mL/min Final   Comment: (NOTE) The eGFR has been calculated using the CKD EPI equation. This calculation has not been validated in all clinical situations. eGFR's persistently <90 mL/min signify possible Chronic Kidney Disease.   . Anion gap 11/16/2013 13  5 -  15 Final  . WBC 11/17/2013 9.1  4.0 - 10.5 K/uL Final  . RBC 11/17/2013 3.15* 3.87 - 5.11 MIL/uL Final  . Hemoglobin 11/17/2013 10.5* 12.0 - 15.0 g/dL Final  . HCT 11/17/2013 30.0* 36.0 - 46.0 % Final  . MCV 11/17/2013 95.2  78.0 - 100.0 fL Final  . MCH 11/17/2013 33.3  26.0 - 34.0 pg Final  . MCHC 11/17/2013 35.0  30.0 - 36.0 g/dL Final  . RDW 11/17/2013 13.3  11.5 - 15.5 % Final  . Platelets 11/17/2013 254  150 - 400 K/uL Final  . Sodium 11/17/2013 136* 137 - 147 mEq/L Final  . Potassium 11/17/2013 3.3* 3.7 - 5.3 mEq/L Final  . Chloride 11/17/2013 98  96 - 112 mEq/L Final  . CO2 11/17/2013 28  19 - 32 mEq/L Final  . Glucose, Bld 11/17/2013 128* 70 - 99 mg/dL Final  . BUN 11/17/2013 9  6 - 23 mg/dL Final  . Creatinine, Ser 11/17/2013 0.68  0.50 - 1.10 mg/dL Final  . Calcium  11/17/2013 8.8  8.4 - 10.5 mg/dL Final  . GFR calc non Af Amer 11/17/2013 88* >90 mL/min Final  . GFR calc Af Amer 11/17/2013 >90  >90 mL/min Final   Comment: (NOTE) The eGFR has been calculated using the CKD EPI equation. This calculation has not been validated in all clinical situations. eGFR's persistently <90 mL/min signify possible Chronic Kidney Disease.   Georgiann Hahn gap 11/17/2013 10  5 - 15 Final  Hospital Outpatient Visit on 11/09/2013  Component Date Value Ref Range Status  . MRSA, PCR 11/09/2013 NEGATIVE  NEGATIVE Final  . Staphylococcus aureus 11/09/2013 NEGATIVE  NEGATIVE Final   Comment:        The Xpert SA Assay (FDA approved for NASAL specimens in patients over 35 years of age), is one component of a comprehensive surveillance program.  Test performance has been validated by [employer] for patients greater than or equal to 75 year old. It is not intended to diagnose infection nor to guide or monitor treatment.   Marland Kitchen aPTT 11/09/2013 29  24 - 37 seconds Final  . WBC 11/09/2013 6.8  4.0 - 10.5 K/uL Final  . RBC 11/09/2013 4.08  3.87 - 5.11 MIL/uL Final  . Hemoglobin 11/09/2013 13.4  12.0 - 15.0 g/dL Final  . HCT 11/09/2013 39.4  36.0 - 46.0 % Final  . MCV 11/09/2013 96.6  78.0 - 100.0 fL Final  . MCH 11/09/2013 32.8  26.0 - 34.0 pg Final  . MCHC 11/09/2013 34.0  30.0 - 36.0 g/dL Final  . RDW 11/09/2013 13.1  11.5 - 15.5 % Final  . Platelets 11/09/2013 298  150 - 400 K/uL Final  . Sodium 11/09/2013 142  137 - 147 mEq/L Final  . Potassium 11/09/2013 4.0  3.7 - 5.3 mEq/L Final  . Chloride 11/09/2013 102  96 - 112 mEq/L Final  . CO2 11/09/2013 28  19 - 32 mEq/L Final  . Glucose, Bld 11/09/2013 90  70 - 99 mg/dL Final  . BUN 11/09/2013 22  6 - 23 mg/dL Final  . Creatinine, Ser 11/09/2013 0.73  0.50 - 1.10 mg/dL Final  . Calcium 11/09/2013 10.2  8.4 - 10.5 mg/dL Final  . Total Protein 11/09/2013 7.3  6.0 - 8.3 g/dL Final  . Albumin 11/09/2013 3.7  3.5 - 5.2 g/dL  Final  . AST 11/09/2013 20  0 - 37 U/L Final  . ALT 11/09/2013 15  0 - 35 U/L Final  .  Alkaline Phosphatase 11/09/2013 91  39 - 117 U/L Final  . Total Bilirubin 11/09/2013 0.3  0.3 - 1.2 mg/dL Final  . GFR calc non Af Amer 11/09/2013 86* >90 mL/min Final  . GFR calc Af Amer 11/09/2013 >90  >90 mL/min Final   Comment: (NOTE) The eGFR has been calculated using the CKD EPI equation. This calculation has not been validated in all clinical situations. eGFR's persistently <90 mL/min signify possible Chronic Kidney Disease.   . Anion gap 11/09/2013 12  5 - 15 Final  . Prothrombin Time 11/09/2013 13.8  11.6 - 15.2 seconds Final  . INR 11/09/2013 1.05  0.00 - 1.49 Final  . Color, Urine 11/09/2013 YELLOW  YELLOW Final  . APPearance 11/09/2013 CLEAR  CLEAR Final  . Specific Gravity, Urine 11/09/2013 1.025  1.005 - 1.030 Final  . pH 11/09/2013 6.5  5.0 - 8.0 Final  . Glucose, UA 11/09/2013 NEGATIVE  NEGATIVE mg/dL Final  . Hgb urine dipstick 11/09/2013 NEGATIVE  NEGATIVE Final  . Bilirubin Urine 11/09/2013 NEGATIVE  NEGATIVE Final  . Ketones, ur 11/09/2013 NEGATIVE  NEGATIVE mg/dL Final  . Protein, ur 11/09/2013 NEGATIVE  NEGATIVE mg/dL Final  . Urobilinogen, UA 11/09/2013 0.2  0.0 - 1.0 mg/dL Final  . Nitrite 11/09/2013 NEGATIVE  NEGATIVE Final  . Leukocytes, UA 11/09/2013 MODERATE* NEGATIVE Final  . ABO/RH(D) 11/09/2013 O POS   Final  . Antibody Screen 11/09/2013 NEG   Final  . Sample Expiration 11/09/2013 11/18/2013   Final  . Squamous Epithelial / LPF 11/09/2013 RARE  RARE Final  . WBC, UA 11/09/2013 3-6  <3 WBC/hpf Final  . Bacteria, UA 11/09/2013 RARE  RARE Final  . ABO/RH(D) 11/09/2013 O POS   Final     X-Rays:Dg Chest 2 View  11/09/2013   CLINICAL DATA:  Preop chest x-ray. History of hypertension and breast cancer. Right knee replacement.  EXAM: CHEST  2 VIEW  COMPARISON:  09/15/2007  FINDINGS: Stable persistent linear densities in the lung bases compatible with scarring.  Postoperative changes in the right breast. Heart is normal size. No effusions. No acute bony abnormality.  IMPRESSION: Bibasilar scarring.  No active disease.   Electronically Signed   By: Rolm Baptise M.D.   On: 11/09/2013 12:14    EKG: Orders placed or performed in visit on 08/30/08  . Converted [facility] EKG     Hospital Course: MARTRICE APT is a 68 y.o. who was admitted to [facility]. They were brought to the operating room on 11/15/2013 and underwent Procedure(s): RIGHT TOTAL KNEE ARTHROPLASTY.  Patient tolerated the procedure well and was later transferred to the recovery room and then to the orthopaedic floor for postoperative care.  They were given PO and IV analgesics for pain control following their surgery.  They were given 24 hours of postoperative antibiotics of  Anti-infectives    Start     Dose/Rate Route Frequency Ordered Stop   11/15/13 1400  ceFAZolin (ANCEF) IVPB 2 g/50 mL premix     2 g100 mL/hr over 30 Minutes Intravenous Every 6 hours 11/15/13 1008 11/15/13 1956   11/15/13 0508  ceFAZolin (ANCEF) IVPB 2 g/50 mL premix     2 g100 mL/hr over 30 Minutes Intravenous On call to O.R. 11/15/13 3976 11/15/13 0716     and started on DVT prophylaxis in the form of Xarelto.   PT and OT were ordered for total joint protocol.  Discharge planning consulted to help with postop disposition and equipment needs.  Patient had a good night on the evening of surgery waling about 30 feet or so.  They started to get up OOB with therapy on day one again. Hemovac drain was pulled without difficulty.  Continued to work with therapy into day two.  Dressing was changed on day two and the incision was healing well. Patient was seen in rounds and was ready to go home.  Discharge home with home health Diet - Cardiac diet Follow up - in 2 weeks Activity - WBAT Disposition - Home Condition Upon Discharge - Good D/C Meds - See DC Summary DVT Prophylaxis - Xarelto      Discharge Instructions      Call MD / Call 911    Complete by:  As directed   If you experience chest pain or shortness of breath, CALL 911 and be transported to the hospital emergency room.  If you develope a fever above 101 F, pus (white drainage) or increased drainage or redness at the wound, or calf pain, call your surgeon's office.     Change dressing    Complete by:  As directed   Change dressing daily with sterile 4 x 4 inch gauze dressing and apply TED hose. Do not submerge the incision under water.     Constipation Prevention    Complete by:  As directed   Drink plenty of fluids.  Prune juice may be helpful.  You may use a stool softener, such as Colace (over the counter) 100 mg twice a day.  Use MiraLax (over the counter) for constipation as needed.     Diet - low sodium heart healthy    Complete by:  As directed      Discharge instructions    Complete by:  As directed   Pick up stool softner and laxative for home. Do not submerge incision under water. May shower. Continue to use ice for pain and swelling from surgery.  Take Xarelto for two and a half more weeks, then discontinue Xarelto. Once the patient has completed the blood thinner regimen, then take a Baby 81 mg Aspirin daily for three more weeks.     Do not put a pillow under the knee. Place it under the heel.    Complete by:  As directed      Do not sit on low chairs, stoools or toilet seats, as it may be difficult to get up from low surfaces    Complete by:  As directed      Driving restrictions    Complete by:  As directed   No driving until released by the physician.     Increase activity slowly as tolerated    Complete by:  As directed      Lifting restrictions    Complete by:  As directed   No lifting until released by the physician.     Patient may shower    Complete by:  As directed   You may shower without a dressing once there is no drainage.  Do not wash over the wound.  If drainage remains, do not shower until drainage stops.      TED hose    Complete by:  As directed   Use stockings (TED hose) for 3 weeks on both leg(s).  You may remove them at night for sleeping.     Weight bearing as tolerated    Complete by:  As directed   Laterality:  right  Extremity:  Lower  Medication List    STOP taking these medications        CALCIUM PO     CENTRUM SILVER PO     denosumab 60 MG/ML Soln injection  Commonly known as:  PROLIA     FISH OIL PO     meloxicam 15 MG tablet  Commonly known as:  MOBIC     VITAMIN D PO      TAKE these medications        acetaminophen 325 MG tablet  Commonly known as:  TYLENOL  Take 650 mg by mouth every 6 (six) hours as needed for mild pain or headache.     fexofenadine 180 MG tablet  Commonly known as:  ALLEGRA  Take 180 mg by mouth daily as needed for allergies.     hydrochlorothiazide 12.5 MG capsule  Commonly known as:  MICROZIDE  Take 12.5 mg by mouth every morning.     methocarbamol 500 MG tablet  Commonly known as:  ROBAXIN  Take 1 tablet (500 mg total) by mouth every 6 (six) hours as needed for muscle spasms.     oxyCODONE 5 MG immediate release tablet  Commonly known as:  Oxy IR/ROXICODONE  Take 1-2 tablets (5-10 mg total) by mouth every 3 (three) hours as needed for moderate pain, severe pain or breakthrough pain.     pantoprazole 40 MG tablet  Commonly known as:  PROTONIX  Take 40 mg by mouth every morning.     rivaroxaban 10 MG Tabs tablet  Commonly known as:  XARELTO  - Take 1 tablet (10 mg total) by mouth daily with breakfast. Take Xarelto for two and a half more weeks, then discontinue Xarelto.  - Once the patient has completed the blood thinner regimen, then take a Baby 81 mg Aspirin daily for three more weeks.     traMADol 50 MG tablet  Commonly known as:  ULTRAM  Take 1-2 tablets (50-100 mg total) by mouth every 6 (six) hours as needed (mild pain).     zolpidem 5 MG tablet  Commonly known as:  AMBIEN  Take 5 mg by mouth at  bedtime as needed for sleep.       Follow-up Information    Follow up with [facility].   Why:  home health physical therapy   Contact information:   [place] [address] [phone]       Follow up with [facility].   Why:  3n1 (commode) and rolling walker   Contact information:   [address] [place] [address] [phone]       Follow up with Gearlean Alf, MD. Schedule an appointment as soon as possible for a visit on 11/30/2013.   Specialty:  Orthopedic Surgery   Why:  Call office at [phone] to set up appointment on Tuesday 11/30/2013.   Contact information:   [address] [place] [address] [phone]       Signed: Arlee Muslim, PA-C Orthopaedic Surgery 11/25/2013, 9:37 AM

## 2013-11-16 NOTE — Discharge Instructions (Signed)
° °Dr. Frank Aluisio °Total Joint Specialist °[facility] °[address] °[place], [place] [address] °[phone] ° °TOTAL KNEE REPLACEMENT POSTOPERATIVE DIRECTIONS ° ° ° °Knee Rehabilitation, Guidelines Following Surgery  °Results after knee surgery are often greatly improved when you follow the exercise, range of motion and muscle strengthening exercises prescribed by your doctor. Safety measures are also important to protect the knee from further injury. Any time any of these exercises cause you to have increased pain or swelling in your knee joint, decrease the amount until you are comfortable again and slowly increase them. If you have problems or questions, call your caregiver or physical therapist for advice.  ° °HOME CARE INSTRUCTIONS  °Remove items at home which could result in a fall. This includes throw rugs or furniture in walking pathways.  °Continue medications as instructed at time of discharge. °You may have some home medications which will be placed on hold until you complete the course of blood thinner medication.  °You may start showering once you are discharged home but do not submerge the incision under water. Just pat the incision dry and apply a dry gauze dressing on daily. °Walk with walker as instructed.  °You may resume a sexual relationship in one month or when given the OK by  your doctor.  °· Use walker as long as suggested by your caregivers. °· Avoid periods of inactivity such as sitting longer than an hour when not asleep. This helps prevent blood clots.  °You may put full weight on your legs and walk as much as is comfortable.  °You may return to work once you are cleared by your doctor.  °Do not drive a car for 6 weeks or until released by you surgeon.  °· Do not drive while taking narcotics.  °Wear the elastic stockings for three weeks following surgery during the day but you may remove then at night. °Make sure you keep all of your appointments after your  operation with all of your doctors and caregivers. You should call the office at the above phone number and make an appointment for approximately two weeks after the date of your surgery. °Change the dressing daily and reapply a dry dressing each time. °Please pick up a stool softener and laxative for home use as long as you are requiring pain medications. °· Continue to use ice on the knee for pain and swelling from surgery. You may notice swelling that will progress down to the foot and ankle.  This is normal after surgery.  Elevate the leg when you are not up walking on it.   °It is important for you to complete the blood thinner medication as prescribed by your doctor. °· Continue to use the breathing machine which will help keep your temperature down.  It is common for your temperature to cycle up and down following surgery, especially at night when you are not up moving around and exerting yourself.  The breathing machine keeps your lungs expanded and your temperature down. ° °RANGE OF MOTION AND STRENGTHENING EXERCISES  °Rehabilitation of the knee is important following a knee injury or an operation. After just a few days of immobilization, the muscles of the thigh which control the knee become weakened and shrink (atrophy). Knee exercises are designed to build up the tone and strength of the thigh muscles and to improve knee motion. Often times heat used for twenty to thirty minutes before working out will loosen up your tissues and help with improving the   range of motion but do not use heat for the first two weeks following surgery. These exercises can be done on a training (exercise) mat, on the floor, on a table or on a bed. Use what ever works the best and is most comfortable for you Knee exercises include:  Leg Lifts - While your knee is still immobilized in a splint or cast, you can do straight leg raises. Lift the leg to 60 degrees, hold for 3 sec, and slowly lower the leg. Repeat 10-20 times 2-3  times daily. Perform this exercise against resistance later as your knee gets better.  Quad and Hamstring Sets - Tighten up the muscle on the front of the thigh (Quad) and hold for 5-10 sec. Repeat this 10-20 times hourly. Hamstring sets are done by pushing the foot backward against an object and holding for 5-10 sec. Repeat as with quad sets.  A rehabilitation program following serious knee injuries can speed recovery and prevent re-injury in the future due to weakened muscles. Contact your doctor or a physical therapist for more information on knee rehabilitation.   SKILLED REHAB INSTRUCTIONS: If the patient is transferred to a skilled rehab facility following release from the hospital, a list of the current medications will be sent to the facility for the patient to continue.  When discharged from the skilled rehab facility, please have the facility set up the patient's [place] prior to being released. Also, the skilled facility will be responsible for providing the patient with their medications at time of release from the facility to include their pain medication, the muscle relaxants, and their blood thinner medication. If the patient is still at the rehab facility at time of the two week follow up appointment, the skilled rehab facility will also need to assist the patient in arranging follow up appointment in our office and any transportation needs.  MAKE SURE YOU:  Understand these instructions.  Will watch your condition.  Will get help right away if you are not doing well or get worse.    Pick up stool softner and laxative for home. Do not submerge incision under water. May shower. Continue to use ice for pain and swelling from surgery.  Take Xarelto for two and a half more weeks, then discontinue Xarelto. Once the patient has completed the blood thinner regimen, then take a Baby 81 mg Aspirin daily for three more weeks.   Postoperative Constipation  Protocol  Constipation - defined medically as fewer than three stools per week and severe constipation as less than one stool per week.  One of the most common issues patients have following surgery is constipation.  Even if you have a regular bowel pattern at home, your normal regimen is likely to be disrupted due to multiple reasons following surgery.  Combination of anesthesia, postoperative narcotics, change in appetite and fluid intake all can affect your bowels.  In order to avoid complications following surgery, here are some recommendations in order to help you during your recovery period.  Colace (docusate) - Pick up an over-the-counter form of Colace or another stool softener and take twice a day as long as you are requiring postoperative pain medications.  Take with a full glass of water daily.  If you experience loose stools or diarrhea, hold the colace until you stool forms back up.  If your symptoms do not get better within 1 week or if they get worse, check with your doctor.  Dulcolax (bisacodyl) - Pick up over-the-counter  and take as directed by the product packaging as needed to assist with the movement of your bowels.  Take with a full glass of water.  Use this product as needed if not relieved by Colace only.   MiraLax (polyethylene glycol) - Pick up over-the-counter to have on hand.  MiraLax is a solution that will increase the amount of water in your bowels to assist with bowel movements.  Take as directed and can mix with a glass of water, juice, soda, coffee, or tea.  Take if you go more than two days without a movement. Do not use MiraLax more than once per day. Call your doctor if you are still constipated or irregular after using this medication for 7 days in a row.  If you continue to have problems with postoperative constipation, please contact the office for further assistance and recommendations.  If you experience "the worst abdominal pain ever" or develop nausea or  vomiting, please contact the office immediatly for further recommendations for treatment.   Information on my medicine - XARELTO (Rivaroxaban)  This medication education was reviewed with me or my healthcare representative as part of my discharge preparation.  The pharmacist that spoke with me during my hospital stay was:  Minda Ditto, [facility]  Why was Xarelto prescribed for you? Xarelto was prescribed for you to reduce the risk of blood clots forming after orthopedic surgery. The medical term for these abnormal blood clots is venous thromboembolism (VTE).  What do you need to know about xarelto ? Take your Xarelto ONCE DAILY at the same time every day. You may take it either with or without food.  If you have difficulty swallowing the tablet whole, you may crush it and mix in applesauce just prior to taking your dose.  Take Xarelto exactly as prescribed by your doctor and DO NOT stop taking Xarelto without talking to the doctor who prescribed the medication.  Stopping without other VTE prevention medication to take the place of Xarelto may increase your risk of developing a clot.  After discharge, you should have regular check-up appointments with your healthcare provider that is prescribing your Xarelto.    What do you do if you miss a dose? If you miss a dose, take it as soon as you remember on the same day then continue your regularly scheduled once daily regimen the next day. Do not take two doses of Xarelto on the same day.   Important Safety Information A possible side effect of Xarelto is bleeding. You should call your healthcare provider right away if you experience any of the following: ? Bleeding from an injury or your nose that does not stop. ? Unusual colored urine (red or dark brown) or unusual colored stools (red or black). ? Unusual bruising for unknown reasons. ? A serious fall or if you hit your head (even if there is no bleeding).  Some medicines may  interact with Xarelto and might increase your risk of bleeding while on Xarelto. To help avoid this, consult your healthcare provider or pharmacist prior to using any new prescription or non-prescription medications, including herbals, vitamins, non-steroidal anti-inflammatory drugs (NSAIDs) and supplements.  This website has more information on Xarelto: [email or web address]

## 2013-11-16 NOTE — Plan of Care (Signed)
Problem: Phase II Progression Outcomes Goal: Ambulates Outcome: Completed/Met Date Met:  11/16/13

## 2013-11-16 NOTE — Progress Notes (Signed)
Physical Therapy Treatment Patient Details Name: Kelsey Mueller MRN: [id number] DOB: October 18, 1945 Today's Date: 11/16/2013    History of Present Illness s/p RTKA    PT Comments    Pt was able to increase gait tolerance to 60 feet this tx.  Pt is mod I with bed mobility and supervision with transfers and gait.  Pt required mod VCs for RW management during transfers and gait.    Follow Up Recommendations  Home health PT     Equipment Recommendations  Rolling walker with 5" wheels    Recommendations for Other Services       Precautions / Restrictions Precautions Precautions: Knee Required Braces or Orthoses: Knee Immobilizer - Right Knee Immobilizer - Right: Discontinue once straight leg raise with < 10 degree lag Restrictions Weight Bearing Restrictions: No Other Position/Activity Restrictions: WBAT    Mobility  Bed Mobility Overal bed mobility: Modified Independent Bed Mobility: Supine to Sit;Sit to Supine     Supine to sit: Modified independent (Device/Increase time);HOB elevated Sit to supine: Modified independent (Device/Increase time)   General bed mobility comments: VCs for LLE A to get RLE up onto bed  Transfers Overall transfer level: Needs assistance Equipment used: Rolling walker (2 wheeled) Transfers: Sit to/from Stand Sit to Stand: Supervision         General transfer comment: VCs for hand placement  Ambulation/Gait Ambulation/Gait assistance: Min guard Ambulation Distance (Feet): 60 Feet Assistive device: Rolling walker (2 wheeled)       General Gait Details: VCs for sequencing and RW management.   Stairs            Wheelchair Mobility    Modified Rankin (Stroke Patients Only)       Balance                                    Cognition Arousal/Alertness: Awake/alert Behavior During Therapy: WFL for tasks assessed/performed Overall Cognitive Status: Within Functional Limits for tasks assessed                      Exercises Total Joint Exercises Ankle Circles/Pumps: AROM;Both;10 reps Quad Sets: Strengthening;10 reps;Both Heel Slides: AAROM;Right;10 reps Hip ABduction/ADduction: AAROM;Strengthening;Right;10 reps Straight Leg Raises: AAROM;AROM;Right;10 reps Goniometric ROM: 55*    General Comments        Pertinent Vitals/Pain Pain Assessment: 0-10 Pain Score: 5  Pain Location: R knee Pain Intervention(s): Monitored during session;Ice applied;Repositioned    Home Living                      Prior Function            PT Goals (current goals can now be found in the care plan section) Acute Rehab PT Goals PT Goal Formulation: With patient Time For Goal Achievement: 11/22/13 Potential to Achieve Goals: Good Progress towards PT goals: Progressing toward goals    Frequency  7X/week    PT Plan Current plan remains appropriate    Co-evaluation             End of Session Equipment Utilized During Treatment: Gait belt Activity Tolerance: Patient tolerated treatment well Patient left: in bed;with call bell/phone within reach;with family/visitor present     Time: 1520-1536 PT Time Calculation (min) (ACUTE ONLY): 16 min  Charges:  $Gait Training: 8-22 mins $Therapeutic Exercise: 8-22 mins  G Codes:      Miller,Derrick, SPTA 11/16/2013, 3:47 PM   Reviewed above  Rica Koyanagi  PTA [facility]  Acute  Rehab Pager      [phone]

## 2013-11-16 NOTE — Plan of Care (Signed)
Problem: Consults Goal: Total Joint Replacement Patient Education See Patient Education Module for education specifics.  Outcome: Completed/Met Date Met:  11/16/13 Goal: Diagnosis- Total Joint Replacement Outcome: Completed/Met Date Met:  11/16/13 Primary Total Knee Goal: Skin Care Protocol Initiated - if Braden Score 18 or less If consults are not indicated, leave blank or document N/A  Outcome: Not Applicable Date Met:  12/78/71  Problem: Phase I Progression Outcomes Goal: Initial discharge plan identified Outcome: Completed/Met Date Met:  11/16/13 Goal: Other Phase I Outcomes/Goals Outcome: Not Applicable Date Met:  83/67/25  Problem: Phase II Progression Outcomes Goal: Tolerating diet Outcome: Completed/Met Date Met:  11/16/13 Goal: Discharge plan established Outcome: Completed/Met Date Met:  11/16/13 Goal: Other Phase II Outcomes/Goals Outcome: Not Applicable Date Met:  50/01/64

## 2013-11-16 NOTE — Progress Notes (Signed)
Physical Therapy Treatment Patient Details Name: Kelsey Mueller MRN: [id number] DOB: 03/18/1945 Today's Date: 11/16/2013    History of Present Illness s/p RTKA    PT Comments    Pt progressing well;   Follow Up Recommendations  Home health PT     Equipment Recommendations  Rolling walker with 5" wheels    Recommendations for Other Services       Precautions / Restrictions Precautions Precautions: Knee Required Braces or Orthoses: Knee Immobilizer - Right Knee Immobilizer - Right: Discontinue once straight leg raise with < 10 degree lag Restrictions Weight Bearing Restrictions: No Other Position/Activity Restrictions: WBAT    Mobility  Bed Mobility Overal bed mobility: Needs Assistance Bed Mobility: Sit to Supine     Supine to sit: Modified independent (Device/Increase time) Sit to supine: Min assist   General bed mobility comments: assist to bring RLE onto bed  Transfers Overall transfer level: Needs assistance Equipment used: Rolling walker (2 wheeled) Transfers: Sit to/from Stand Sit to Stand: Supervision         General transfer comment: verbal cues for hand placement and RLE position  Ambulation/Gait Ambulation/Gait assistance: Min guard;Supervision Ambulation Distance (Feet): 50 Feet Assistive device: Rolling walker (2 wheeled) Gait Pattern/deviations: Step-to pattern;Antalgic     General Gait Details: verbal cues for step length, RW position and sequencing   Stairs            Wheelchair Mobility    Modified Rankin (Stroke Patients Only)       Balance Overall balance assessment: Needs assistance Sitting-balance support: No upper extremity supported;Feet supported Sitting balance-Leahy Scale: Normal     Standing balance support: Bilateral upper extremity supported;During functional activity Standing balance-Leahy Scale: Good Standing balance comment: Pt able to stand at sink w/o assist to groom w no challenges.                    Cognition Arousal/Alertness: Awake/alert Behavior During Therapy: WFL for tasks assessed/performed Overall Cognitive Status: Within Functional Limits for tasks assessed                      Exercises Total Joint Exercises Ankle Circles/Pumps: AROM;Both;10 reps Quad Sets: Strengthening;10 reps;Both Heel Slides: AAROM;Right;10 reps Hip ABduction/ADduction: AAROM;Strengthening;Right;10 reps Straight Leg Raises: AAROM;AROM;Right;10 reps Goniometric ROM: 55*    General Comments General comments (skin integrity, edema, etc.): Pt doing well with adls. All adl education complete.      Pertinent Vitals/Pain Pain Assessment: 0-10 Pain Score: 6  Pain Location: R knee Pain Descriptors / Indicators: Constant Pain Intervention(s): Limited activity within patient's tolerance;Monitored during session;Ice applied;Patient requesting pain meds-RN notified    Home Living Family/patient expects to be discharged to:: Private residence Living Arrangements: Spouse/significant other Available Help at Discharge: Family;Available 24 hours/day Type of Home: House Home Access: Stairs to enter Entrance Stairs-Rails: Right Home Layout: Able to live on main level with bedroom/bathroom;Two level Home Equipment: None      Prior Function Level of Independence: Independent          PT Goals (current goals can now be found in the care plan section) Acute Rehab PT Goals Patient Stated Goal: return to I PT Goal Formulation: With patient Time For Goal Achievement: 11/22/13 Potential to Achieve Goals: Good Progress towards PT goals: Progressing toward goals    Frequency  7X/week    PT Plan Current plan remains appropriate    Co-evaluation             End  of Session Equipment Utilized During Treatment: Gait belt Activity Tolerance: Patient tolerated treatment well Patient left: in bed;with call bell/phone within reach;with family/visitor present     Time: 1130-1155 PT  Time Calculation (min) (ACUTE ONLY): 25 min  Charges:  $Gait Training: 8-22 mins $Therapeutic Exercise: 8-22 mins                    G Codes:      Deborh Pense 09-Dec-2013, 12:04 PM

## 2013-11-16 NOTE — Progress Notes (Signed)
   Subjective: 1 Day Post-Op Procedure(s) (LRB): RIGHT TOTAL KNEE ARTHROPLASTY (Right) Patient reports pain as mild.   Patient seen in rounds with Dr. Wynelle Link. Able to get some sleep. Husband in room at bedside. Patient is well, and has had no acute complaints or problems We will resume therapy today.  She walked 30 feet on the day of surgery. Plan is to go Home after hospital stay.  Objective: Vital signs in last 24 hours: Temp:  [97.2 F (36.2 C)-98.7 F (37.1 C)] 98.7 F (37.1 C) (11/10 0638) Pulse Rate:  [46-68] 67 (11/10 0638) Resp:  [13-18] 17 (11/10 0638) BP: (113-167)/(50-80) 122/62 mmHg (11/10 0638) SpO2:  [97 %-100 %] 100 % (11/10 0258)  Intake/Output from previous day:  Intake/Output Summary (Last 24 hours) at 11/16/13 0856 Last data filed at 11/16/13 0700  Gross per 24 hour  Intake 2441.25 ml  Output   2255 ml  Net 186.25 ml     Labs:  Recent Labs  11/16/13 0350  HGB 10.9*    Recent Labs  11/16/13 0350  WBC 9.2  RBC 3.33*  HCT 31.4*  PLT 266    Recent Labs  11/16/13 0350  NA 135*  K 3.6*  CL 97  CO2 25  BUN 12  CREATININE 0.69  GLUCOSE 132*  CALCIUM 8.8   No results for input(s): LABPT, INR in the last 72 hours.  EXAM General - Patient is Alert, Appropriate and Oriented Extremity - Neurovascular intact Sensation intact distally Dorsiflexion/Plantar flexion intact Dressing - dressing C/D/I Motor Function - intact, moving foot and toes well on exam.  Hemovac pulled without difficulty.  Past Medical History  Diagnosis Date  . Osteoporosis   . GERD (gastroesophageal reflux disease)   . Depression   . Obesity   . Allergic rhinoconjunctivitis   . Infertility     took clomid with second pregnancy  . Polio     age 24 month old-"slight case"  . Knee injury 06/13/12    fell, cracked femur under knee cap-right  . Environmental allergies   . Hypertension   . Heart murmur     slight  . Asthma     very mild  . Pneumonia     hx of    . Migraine     hx of menstrual  . Arthritis   . Anemia     hx of  . Breast cancer     s/p XRT and lumpectomy 2009  . Left-sided weakness     left arm due to polio    Assessment/Plan: 1 Day Post-Op Procedure(s) (LRB): RIGHT TOTAL KNEE ARTHROPLASTY (Right) Active Problems:   OA (osteoarthritis) of knee  Estimated body mass index is 29.62 kg/(m^2) as calculated from the following:   Height as of this encounter: 5\' 2"  (1.575 m).   Weight as of this encounter: 73.483 kg (162 lb). Advance diet Up with therapy Plan for discharge tomorrow Discharge home with home health  DVT Prophylaxis - Xarelto Weight-Bearing as tolerated to right leg D/C O2 and Pulse OX and try on Room Air  Arlee Muslim, PA-C Orthopaedic Surgery 11/16/2013, 8:56 AM

## 2013-11-17 LAB — BASIC METABOLIC PANEL
ANION GAP: 10 (ref 5–15)
BUN: 9 mg/dL (ref 6–23)
CALCIUM: 8.8 mg/dL (ref 8.4–10.5)
CO2: 28 mEq/L (ref 19–32)
CREATININE: 0.68 mg/dL (ref 0.50–1.10)
Chloride: 98 mEq/L (ref 96–112)
GFR, EST NON AFRICAN AMERICAN: 88 mL/min — AB (ref >90)
Glucose, Bld: 128 mg/dL — ABNORMAL HIGH (ref 70–99)
Potassium: 3.3 mEq/L — ABNORMAL LOW (ref 3.7–5.3)
Sodium: 136 mEq/L — ABNORMAL LOW (ref 137–147)

## 2013-11-17 LAB — CBC
HEMATOCRIT: 30 % — AB (ref 36.0–46.0)
Hemoglobin: 10.5 g/dL — ABNORMAL LOW (ref 12.0–15.0)
MCH: 33.3 pg (ref 26.0–34.0)
MCHC: 35 g/dL (ref 30.0–36.0)
MCV: 95.2 fL (ref 78.0–100.0)
PLATELETS: 254 10*3/uL (ref 150–400)
RBC: 3.15 MIL/uL — ABNORMAL LOW (ref 3.87–5.11)
RDW: 13.3 % (ref 11.5–15.5)
WBC: 9.1 10*3/uL (ref 4.0–10.5)

## 2013-11-17 MED ORDER — POTASSIUM CHLORIDE CRYS ER 20 MEQ PO TBCR
40.0000 meq | EXTENDED_RELEASE_TABLET | ORAL | Status: DC
  Administered 2013-11-17: 40 meq via ORAL
  Filled 2013-11-17 (×2): qty 2

## 2013-11-17 NOTE — Progress Notes (Signed)
Physical Therapy Treatment Patient Details Name: ARLIN SAVONA MRN: [id number] DOB: [birth year] Today's Date: 11/17/2013    History of Present Illness s/p RTKA    PT Comments    Pt progressing well and eager for dc this date.  Reviewed stairs and don/doff KI with pt and spouse  Follow Up Recommendations  Home health PT     Equipment Recommendations  Rolling walker with 5" wheels    Recommendations for Other Services       Precautions / Restrictions Precautions Precautions: Knee Required Braces or Orthoses: Knee Immobilizer - Right Knee Immobilizer - Right: Discontinue once straight leg raise with < 10 degree lag Restrictions Weight Bearing Restrictions: No Other Position/Activity Restrictions: WBAT    Mobility  Bed Mobility                  Transfers Overall transfer level: Needs assistance Equipment used: Rolling walker (2 wheeled) Transfers: Sit to/from Stand Sit to Stand: Supervision         General transfer comment: VCs for hand placement and LE management  Ambulation/Gait Ambulation/Gait assistance: Min guard;Supervision Ambulation Distance (Feet): 120 Feet Assistive device: Rolling walker (2 wheeled) Gait Pattern/deviations: Step-to pattern;Decreased step length - right;Decreased step length - left;Shuffle;Trunk flexed Gait velocity: decr   General Gait Details: cues for sequence, posture and position from RW   Stairs Stairs: Yes Stairs assistance: Min assist Stair Management: One rail Right;Step to pattern;Forwards;With crutches Number of Stairs: 4 General stair comments: cues for sequence and foot/crutch placement - written instructions provided  Wheelchair Mobility    Modified Rankin (Stroke Patients Only)       Balance                                    Cognition Arousal/Alertness: Awake/alert Behavior During Therapy: WFL for tasks assessed/performed Overall Cognitive Status: Within Functional Limits for  tasks assessed                      Exercises Total Joint Exercises Ankle Circles/Pumps: AROM;Both;20 reps;Supine Quad Sets: Strengthening;Both;20 reps;Supine Heel Slides: AAROM;Right;15 reps;Supine Straight Leg Raises: AAROM;Right;20 reps;Supine Goniometric ROM: -10- 55 with muscle guarding    General Comments        Pertinent Vitals/Pain Pain Assessment: 0-10 Pain Score: 5  Pain Location: R knee Pain Descriptors / Indicators: Aching;Sore Pain Intervention(s): Limited activity within patient's tolerance;Monitored during session;Premedicated before session;Ice applied    Home Living                      Prior Function            PT Goals (current goals can now be found in the care plan section) Acute Rehab PT Goals Patient Stated Goal: return to I PT Goal Formulation: With patient Time For Goal Achievement: 11/22/13 Potential to Achieve Goals: Good Progress towards PT goals: Progressing toward goals    Frequency  7X/week    PT Plan Current plan remains appropriate    Co-evaluation             End of Session Equipment Utilized During Treatment: Gait belt;Right knee immobilizer Activity Tolerance: Patient tolerated treatment well Patient left: in chair;with call bell/phone within reach;with family/visitor present     Time: 4098-1191 PT Time Calculation (min) (ACUTE ONLY): 38 min  Charges:  $Gait Training: 8-22 mins $Therapeutic Exercise: 8-22 mins $Therapeutic Activity: 8-22 mins  G Codes:      Daneen Volcy 11/24/13, 11:01 AM

## 2013-11-17 NOTE — Progress Notes (Signed)
[facility]   [facility] is providing the following services: RW and Commode  If patient discharges after hours, please call [phone].   Linward Headland 11/17/2013, 11:02 AM

## 2013-11-17 NOTE — Progress Notes (Signed)
Occupational Therapy Treatment Patient Details Name: WESSIE SHANKS MRN: [id number] DOB: 1945-08-08 Today's Date: 11/17/2013    History of present illness s/p RTKA   OT comments  Pt doing well:  Needs cues for safety with ADLs/standing  Follow Up Recommendations  Supervision/Assistance - 24 hour;Home health OT    Equipment Recommendations  3 in 1 bedside comode    Recommendations for Other Services      Precautions / Restrictions Precautions Precautions: Knee Required Braces or Orthoses: Knee Immobilizer - Right Knee Immobilizer - Right: Discontinue once straight leg raise with < 10 degree lag Restrictions Other Position/Activity Restrictions: WBAT       Mobility Bed Mobility                  Transfers     Transfers: Sit to/from Stand Sit to Stand: Supervision         General transfer comment: VCs for hand placement    Balance                                   ADL                                   Tub/ Shower Transfer: Min guard;Ambulation;Walk-in shower     General ADL Comments: Pt was finishing dressing:  cued to get balance prior to adjusting pants. Also cued to make sure that pants were completely over her foot prior to standing.  She had already used commode.  Cues for sequence with shower transfer  Reviewed KI application      Vision                     Perception     Praxis      Cognition   Behavior During Therapy: [facility] for tasks assessed/performed Overall Cognitive Status: Within Functional Limits for tasks assessed                       Extremity/Trunk Assessment               Exercises     Shoulder Instructions       General Comments      Pertinent Vitals/ Pain       Pain Score: 5  Pain Location: R knee Pain Intervention(s): Limited activity within patient's tolerance;Monitored during session;Premedicated before session;Repositioned;Ice applied  Home Living                                           Prior Functioning/Environment              Frequency Min 2X/week     Progress Toward Goals  OT Goals(current goals can now be found in the care plan section)  Progress towards OT goals: Progressing toward goals     Plan      Co-evaluation                 End of Session     Activity Tolerance Patient tolerated treatment well   Patient Left in chair;with call bell/phone within reach;with family/visitor present   Nurse Communication          Time: 1007-1219 OT Time Calculation (min): 19 min  Charges: OT General Charges $OT Visit: 1 Procedure OT Treatments $Self Care/Home Management : 8-22 mins  Teyah Rossy 11/17/2013, 8:19 AM  Lesle Chris, OTR/L [phone] 11/17/2013

## 2013-11-17 NOTE — Progress Notes (Signed)
   Subjective: 2 Days Post-Op Procedure(s) (LRB): RIGHT TOTAL KNEE ARTHROPLASTY (Right) Patient reports pain as mild.   Patient seen in rounds with Dr. Wynelle Link. Patient is well, but has had some minor complaints of pain in the knee, requiring pain medications Patient is ready to go home  Objective: Vital signs in last 24 hours: Temp:  [98.6 F (37 C)-99.7 F (37.6 C)] 99.6 F (37.6 C) (11/11 0502) Pulse Rate:  [67-72] 68 (11/11 0502) Resp:  [16] 16 (11/11 0502) BP: (127-158)/(55-81) 145/78 mmHg (11/11 0502) SpO2:  [93 %-100 %] 93 % (11/11 0502)  Intake/Output from previous day:  Intake/Output Summary (Last 24 hours) at 11/17/13 0744 Last data filed at 11/16/13 2200  Gross per 24 hour  Intake 891.25 ml  Output    600 ml  Net 291.25 ml     Labs:  Recent Labs  11/16/13 0350 11/17/13 0400  HGB 10.9* 10.5*    Recent Labs  11/16/13 0350 11/17/13 0400  WBC 9.2 9.1  RBC 3.33* 3.15*  HCT 31.4* 30.0*  PLT 266 254    Recent Labs  11/16/13 0350 11/17/13 0400  NA 135* 136*  K 3.6* 3.3*  CL 97 98  CO2 25 28  BUN 12 9  CREATININE 0.69 0.68  GLUCOSE 132* 128*  CALCIUM 8.8 8.8   No results for input(s): LABPT, INR in the last 72 hours.  EXAM: General - Patient is Alert, Appropriate and Oriented Extremity - Neurovascular intact Sensation intact distally Incision - clean, dry, no drainage, healing Motor Function - intact, moving foot and toes well on exam.   Assessment/Plan: 2 Days Post-Op Procedure(s) (LRB): RIGHT TOTAL KNEE ARTHROPLASTY (Right) Procedure(s) (LRB): RIGHT TOTAL KNEE ARTHROPLASTY (Right) Past Medical History  Diagnosis Date  . Osteoporosis   . GERD (gastroesophageal reflux disease)   . Depression   . Obesity   . Allergic rhinoconjunctivitis   . Infertility     took clomid with second pregnancy  . Polio     age 69 month old-"slight case"  . Knee injury 06/13/12    fell, cracked femur under knee cap-right  . Environmental allergies   .  Hypertension   . Heart murmur     slight  . Asthma     very mild  . Pneumonia     hx of  . Migraine     hx of menstrual  . Arthritis   . Anemia     hx of  . Breast cancer     s/p XRT and lumpectomy 2009  . Left-sided weakness     left arm due to polio   Active Problems:   OA (osteoarthritis) of knee  Estimated body mass index is 29.62 kg/(m^2) as calculated from the following:   Height as of this encounter: 5\' 2"  (1.575 m).   Weight as of this encounter: 73.483 kg (162 lb). Up with therapy Discharge home with home health Diet - Cardiac diet Follow up - in 2 weeks Activity - WBAT Disposition - Home Condition Upon Discharge - Good D/C Meds - See DC Summary DVT Prophylaxis - Xarelto  Arlee Muslim, PA-C Orthopaedic Surgery 11/17/2013, 7:44 AM

## 2014-07-01 ENCOUNTER — Encounter: Payer: Self-pay | Admitting: Genetic Counselor

## 2014-07-07 DIAGNOSIS — K580 Irritable bowel syndrome with diarrhea: Secondary | ICD-10-CM | POA: Insufficient documentation

## 2014-07-07 DIAGNOSIS — Z96659 Presence of unspecified artificial knee joint: Secondary | ICD-10-CM | POA: Insufficient documentation

## 2014-08-05 ENCOUNTER — Telehealth: Payer: Self-pay | Admitting: Genetic Counselor

## 2014-08-05 NOTE — Telephone Encounter (Signed)
Genetic appt-patient called to schedule f/u genetic appt for 08/22 @ 9 w/Karen Florene Glen.

## 2014-08-09 ENCOUNTER — Telehealth: Payer: Self-pay | Admitting: Gastroenterology

## 2014-08-09 NOTE — Telephone Encounter (Signed)
I found the documentation for a 10 year recall based on a normal colonoscopy in 02/24/2004. You had reviewed it in 2013. i put the reports on your desk. Please confirm the recall date. Thanks!

## 2014-08-09 NOTE — Telephone Encounter (Signed)
She'll need an office visit because she is on Xarelto

## 2014-08-09 NOTE — Telephone Encounter (Signed)
What will be the recall date?

## 2014-08-10 NOTE — Telephone Encounter (Signed)
LM on Vmail to CB to sch'd an OV w/Dr. Deatra Ina

## 2014-08-10 NOTE — Telephone Encounter (Signed)
Kelsey Mueller, she will need an office visit due to being on blood thinner. She is due her colonoscopy based on her previous procedure result. Recommendations are for 10 years.

## 2014-08-10 NOTE — Telephone Encounter (Signed)
She is due now  

## 2014-08-11 ENCOUNTER — Encounter: Payer: Self-pay | Admitting: Gastroenterology

## 2014-08-17 ENCOUNTER — Encounter: Payer: Self-pay | Admitting: Gastroenterology

## 2014-08-19 ENCOUNTER — Encounter: Payer: Self-pay | Admitting: Genetic Counselor

## 2014-08-19 DIAGNOSIS — Z1379 Encounter for other screening for genetic and chromosomal anomalies: Secondary | ICD-10-CM | POA: Insufficient documentation

## 2014-08-29 ENCOUNTER — Other Ambulatory Visit: Payer: Medicare Other

## 2014-08-29 ENCOUNTER — Ambulatory Visit (HOSPITAL_BASED_OUTPATIENT_CLINIC_OR_DEPARTMENT_OTHER): Payer: Medicare Other | Admitting: Genetic Counselor

## 2014-08-29 ENCOUNTER — Encounter: Payer: Self-pay | Admitting: Genetic Counselor

## 2014-08-29 DIAGNOSIS — Z1379 Encounter for other screening for genetic and chromosomal anomalies: Secondary | ICD-10-CM

## 2014-08-29 DIAGNOSIS — Z315 Encounter for genetic counseling: Secondary | ICD-10-CM | POA: Diagnosis not present

## 2014-08-29 DIAGNOSIS — Z8041 Family history of malignant neoplasm of ovary: Secondary | ICD-10-CM

## 2014-08-29 DIAGNOSIS — Z803 Family history of malignant neoplasm of breast: Secondary | ICD-10-CM

## 2014-08-29 DIAGNOSIS — C50911 Malignant neoplasm of unspecified site of right female breast: Secondary | ICD-10-CM

## 2014-08-29 DIAGNOSIS — Z853 Personal history of malignant neoplasm of breast: Secondary | ICD-10-CM

## 2014-08-29 NOTE — Progress Notes (Addendum)
REFERRING PROVIDER: Marton Redwood, MD [place], [place] [address]   Lurline Del, MD  PRIMARY PROVIDER:  Marton Redwood, MD  PRIMARY REASON FOR VISIT:  1. Cancer of right breast   2. Family history of breast cancer   3. Family history of ovarian cancer   4. Genetic testing      HISTORY OF PRESENT ILLNESS:   Kelsey Mueller, a 69 y.o. female, was seen for a [place] cancer genetics consultation at the request of Dr. Brigitte Pulse due to a personal and family history of cancer.  Kelsey Mueller presents to clinic today to discuss the possibility of a hereditary predisposition to cancer, genetic testing, and to further clarify her future cancer risks, as well as potential cancer risks for family members.   In 2009, at the age of 32, Kelsey Mueller was diagnosed with invasive ductal carcinoma of the right breast.  The tumor is ER+/PR+/Her2-. This was treated with lumpectomy and radiation.  Kelsey Mueller had genetic testing through [facility] at the time and was negative for BRCA1 and BRCA2 mutations.  No del/dup performed.   CANCER HISTORY:   No history exists.     HORMONAL RISK FACTORS:  Menarche was at age 27.  First live birth at age 77.  OCP use for approximately 3 years.  Ovaries intact: yes.  Hysterectomy: no.  Menopausal status: postmenopausal.  HRT use: 3-4 years. Colonoscopy: yes; nromal. Mammogram within the last year: yes. Number of breast biopsies: 1. Up to date with pelvic exams:  yes. Any excessive radiation exposure in the past:  Treatment for breast cancer  Past Medical History  Diagnosis Date  . Osteoporosis   . GERD (gastroesophageal reflux disease)   . Depression   . Obesity   . Allergic rhinoconjunctivitis   . Infertility     took clomid with second pregnancy  . Polio     age 41 month old-"slight case"  . Knee injury 06/13/12    fell, cracked femur under knee cap-right  . Environmental allergies   . Hypertension   . Heart murmur     slight  . Asthma     very  mild  . Pneumonia     hx of  . Migraine     hx of menstrual  . Arthritis   . Anemia     hx of  . Breast cancer     s/p XRT and lumpectomy 2009  . Left-sided weakness     left arm due to polio  . Family history of breast cancer   . Family history of ovarian cancer     Past Surgical History  Procedure Laterality Date  . Nissen fundoplication  6270  . Hiatal hernia repair  2005    with Nissen   . Breast lumpectomy  2009    R. Eston Esters   . Breast biopsy      right breast ER/PR +  . Tonsillectomy  69 years old  . Tubal ligation  1976  . Total knee arthroplasty Right 11/15/2013    Procedure: RIGHT TOTAL KNEE ARTHROPLASTY;  Surgeon: Gearlean Alf, MD;  Location: [facility];  Service: Orthopedics;  Laterality: Right;    Social History   Social History  . Marital Status: Married    Spouse Name: N/A  . Number of Children: 3  . Years of Education: N/A   Social History Main Topics  . Smoking status: Never Smoker   . Smokeless tobacco: Never Used  . Alcohol Use: No  Comment: rare  . Drug Use: No  . Sexual Activity:    Partners: Male    Birth Control/ Protection: Post-menopausal   Other Topics Concern  . None   Social History Narrative   Married. Retired Pharmacist, hospital Therapist, art)      FAMILY HISTORY:  We obtained a detailed, 4-generation family history.  Significant diagnoses are listed below: Family History  Problem Relation Age of Onset  . Coronary artery disease Neg Hx   . Breast cancer Paternal Aunt     dx in her 43s, died in her 59s  . Cancer Maternal Uncle     NOS  . Ovarian cancer Maternal Grandmother   . Breast cancer Paternal Aunt     dx in her 8s; bilateral breast cancer; double mastectomy  . Breast cancer Cousin     bilateral breast cancer in her 47s   The patient has two daughters and a son who are healthy.  She has one brother who is cancerfree.  Her parents are both deceased.  Her foather died at 58 from liver failure.  He had five sisters -  one sister was diagnosed in her 31s with breast cancer and died in her early 44s and a second sister was diagnosed with breast cancer in her 38s and had a double mastectomy.  One sister who did not have breast cancer had a daughter with bilateral breast cancer in her 90s.  Her paternal grandmother had ovarian cancer in her 41s.  The patient's mother died at age 26 from complications of Parkinson's disease.  She had a brother and sister.  The brother died of cancer NOS.  No other reported family history of cancer.  Patient's maternal ancestors are of Caucasian descent, and paternal ancestors are of [place] descent. There is no reported Ashkenazi Jewish ancestry. There is no known consanguinity.  GENETIC COUNSELING ASSESSMENT: Kelsey Mueller is a 69 y.o. female with a personal history of breast cancer and family history of breast and ovarian cancer which somewhat suggestive of a hereditary cancer syndrome and predisposition to cancer. We, therefore, discussed and recommended the following at today's visit.   DISCUSSION: We discussed updated BRCA testing and panel testing.  Based on her family history we reviewed other hereditary breast cancer genes including PALB2, ATM and CHEK2.  We reviewed the characteristics, features and inheritance patterns of hereditary cancer syndromes. We also discussed genetic testing, including the appropriate family members to test, the process of testing, insurance coverage and turn-around-time for results. We discussed the implications of a negative, positive and/or variant of uncertain significant result. We recommended Kelsey Mueller pursue genetic testing for the Breast/Ovarian cancer gene panel. The Breast/Ovarian gene panel offered by [facility] includes sequencing and rearrangement analysis for the following 20 genes:  ATM, BARD1, BRCA1, BRCA2, BRIP1, CDH1, CHEK2, EPCAM, FANCC, MLH1, MSH2, MSH6, NBN, PALB2, PMS2, PTEN, RAD51C, RAD51D, TP53, and XRCC2.     Based on Kelsey Mueller  personal and family history of cancer, she meets medical criteria for genetic testing. Despite that she meets criteria, she may still have an out of pocket cost. We discussed that if her out of pocket cost for testing is over $100, the laboratory will call and confirm whether she wants to proceed with testing.  If the out of pocket cost of testing is less than $100 she will be billed by the genetic testing laboratory.   PLAN: After considering the risks, benefits, and limitations, Kelsey Mueller  provided informed consent to pursue genetic testing  and the blood sample was sent to [facility] for analysis of the Breast/Ovarian cancer panel. Results should be available within approximately 2-3 weeks' time, at which point they will be disclosed by telephone to Kelsey Mueller, as will any additional recommendations warranted by these results. Kelsey Mueller will receive a summary of her genetic counseling visit and a copy of her results once available. This information will also be available in Epic. We encouraged Kelsey Mueller to remain in contact with cancer genetics annually so that we can continuously update the family history and inform her of any changes in cancer genetics and testing that may be of benefit for her family. Kelsey Mueller questions were answered to her satisfaction today. Our contact information was provided should additional questions or concerns arise.  Based on Kelsey Mueller family history, we recommended her cousin, who was diagnosed with breast cancer in her 52s, have genetic counseling and testing. We discussed that Kelsey Mueller had breast cancer at an older age than many in her family.  Therefore, she could be part of a hereditary cancer family, but that she does not have a mutation causing her cancer, and instead her cancer was a sporadic cancer.  Knowing whether her cousin was tested and if so, if she is positive, we could let Kelsey Mueller know if she is a true negative or not.  Kelsey Mueller will let  us know if we can be of any assistance in coordinating genetic counseling and/or testing for this family member.   Lastly, we encouraged Ms. Desaulniers to remain in contact with cancer genetics annually so that we can continuously update the family history and inform her of any changes in cancer genetics and testing that may be of benefit for this family.   Ms.  Strieter questions were answered to her satisfaction today. Our contact information was provided should additional questions or concerns arise. Thank you for the referral and allowing [place] to share in the care of your patient.   Karen P. Florene Glen, [place], [facility] Certified Genetic Counselor [email or web address] phone: [phone]  The patient was seen for a total of 45 minutes in face-to-face genetic counseling.  This patient was discussed with Drs. Magrinat, Lindi Adie and/or Burr Medico who agrees with the above.    _______________________________________________________________________ For Office Staff:  Number of people involved in session: 1 Was an Intern/ student involved with case: no

## 2014-08-31 ENCOUNTER — Other Ambulatory Visit: Payer: Self-pay | Admitting: Obstetrics & Gynecology

## 2014-08-31 DIAGNOSIS — Z9889 Other specified postprocedural states: Secondary | ICD-10-CM

## 2014-09-09 ENCOUNTER — Ambulatory Visit: Payer: Self-pay | Admitting: Genetic Counselor

## 2014-09-09 ENCOUNTER — Telehealth: Payer: Self-pay | Admitting: Genetic Counselor

## 2014-09-09 DIAGNOSIS — Z803 Family history of malignant neoplasm of breast: Secondary | ICD-10-CM

## 2014-09-09 DIAGNOSIS — C50911 Malignant neoplasm of unspecified site of right female breast: Secondary | ICD-10-CM

## 2014-09-09 DIAGNOSIS — Z1379 Encounter for other screening for genetic and chromosomal anomalies: Secondary | ICD-10-CM

## 2014-09-09 DIAGNOSIS — Z8041 Family history of malignant neoplasm of ovary: Secondary | ICD-10-CM

## 2014-09-09 NOTE — Progress Notes (Signed)
HPI: Ms. Borgwardt was previously seen in the [facility] due to a personal and family history of cancer and concerns regarding a hereditary predisposition to cancer. Please refer to our prior cancer genetics clinic note for more information regarding Ms. Haupt's medical, social and family histories, and our assessment and recommendations, at the time. Ms. Wentland recent genetic test results were disclosed to her, as were recommendations warranted by these results. These results and recommendations are discussed in more detail below.  FAMILY HISTORY:  We obtained a detailed, 4-generation family history.  Significant diagnoses are listed below: Family History  Problem Relation Age of Onset  . Coronary artery disease Neg Hx   . Breast cancer Paternal Aunt     dx in her 73s, died in her 76s  . Cancer Maternal Uncle     NOS  . Ovarian cancer Maternal Grandmother   . Breast cancer Paternal Aunt     dx in her 76s; bilateral breast cancer; double mastectomy  . Breast cancer Cousin     bilateral breast cancer in her 14s    The patient has two daughters and a son who are healthy. She has one brother who is cancerfree. Her parents are both deceased. Her foather died at 46 from liver failure. He had five sisters - one sister was diagnosed in her 76s with breast cancer and died in her early 30s and a second sister was diagnosed with breast cancer in her 2s and had a double mastectomy. One sister who did not have breast cancer had a daughter with bilateral breast cancer in her 59s. Her paternal grandmother had ovarian cancer in her 10s. The patient's mother died at age 21 from complications of Parkinson's disease. She had a brother and sister. The brother died of cancer NOS. No other reported family history of cancer. Patient's maternal ancestors are of Caucasian descent, and paternal ancestors are of [place] descent. There is no reported Ashkenazi Jewish ancestry. There  is no known consanguinity.  GENETIC TEST RESULTS: At the time of Ms. Carroll visit, we recommended she pursue genetic testing of the Breast/Ovarian cancer gene panel. The Breast/Ovarian gene panel offered by [facility] includes sequencing and rearrangement analysis for the following 20 genes:  ATM, BARD1, BRCA1, BRCA2, BRIP1, CDH1, CHEK2, EPCAM, FANCC, MLH1, MSH2, MSH6, NBN, PALB2, PMS2, PTEN, RAD51C, RAD51D, TP53, and XRCC2.  The report date is September 08, 2014.  Genetic testing was normal, and did not reveal a deleterious mutation in these genes. The test report has been scanned into EPIC and is located under the Molecular Pathology section of the Results Review tab.   We discussed with Ms. Pietsch that since the current genetic testing is not perfect, it is possible there may be a gene mutation in one of these genes that current testing cannot detect, but that chance is small. We also discussed, that it is possible that another gene that has not yet been discovered, or that we have not yet tested, is responsible for the cancer diagnoses in the family, and it is, therefore, important to remain in touch with cancer genetics in the future so that we can continue to offer Ms. Dunbar the most up to date genetic testing.   CANCER SCREENING RECOMMENDATIONS: Given Ms. Darwish's personal and family histories, we must interpret these negative results with some caution.  Families with features suggestive of hereditary risk for cancer tend to have multiple family members with cancer, diagnoses in multiple generations and diagnoses before the  age of 23. Ms. Tabak's family exhibits some of these features. Thus this result may simply reflect our current inability to detect all mutations within these genes or there may be a different gene that has not yet been discovered or tested. That said, Ms. Stepanek developed breast cancer at a typical age of onset, where her relatives were diagnosed 36-30 years younger.  Therefore,  there could still be a hereditary cancer running in the family, but she did not inherit it.  Instead, she developed breast cancer because she was the 1 in 8 women who develop breast cancer.  We recommend that her brother consider testing to help determine his and his children's risk for cancer.  RECOMMENDATIONS FOR FAMILY MEMBERS: Women in this family might be at some increased risk of developing cancer, over the general population risk, simply due to the family history of cancer. We recommended women in this family have a yearly mammogram beginning at age 28, or 64 years younger than the earliest onset of cancer, an an annual clinical breast exam, and perform monthly breast self-exams. Women in this family should also have a gynecological exam as recommended by their primary provider. All family members should have a colonoscopy by age 58.  Based on Ms. Prindle's family history, we recommended her paternal cousin, who was diagnosed with breast cancer at age 54, have genetic counseling and testing. Ms. Bernath will let us know if we can be of any assistance in coordinating genetic counseling and/or testing for this family member.   FOLLOW-UP: Lastly, we discussed with Ms. Goodenow that cancer genetics is a rapidly advancing field and it is possible that new genetic tests will be appropriate for her and/or her family members in the future. We encouraged her to remain in contact with cancer genetics on an annual basis so we can update her personal and family histories and let her know of advances in cancer genetics that may benefit this family.   Our contact number was provided. Ms. Leeder questions were answered to her satisfaction, and she knows she is welcome to call us at anytime with additional questions or concerns.   Roma Kayser, MS, [facility] Certified Genetic Counselor [email or web address]

## 2014-09-09 NOTE — Telephone Encounter (Signed)
Revealed negative genetic testing on the breast/ovarian cancer panel.  Discussed that there is a possibility that there is a hereditary cancer syndrome running on her father's side of the family and she did not inherit it.  Recommended that her paternal cousin undergo genetic testing, as well as her brother, to help determine risks for other family members.

## 2014-09-27 ENCOUNTER — Encounter (HOSPITAL_COMMUNITY): Payer: Self-pay

## 2014-09-27 ENCOUNTER — Ambulatory Visit (HOSPITAL_COMMUNITY)
Admission: RE | Admit: 2014-09-27 | Discharge: 2014-09-27 | Disposition: A | Discharge status: Home / Self Care | Payer: Medicare Other | Source: Ambulatory Visit | Attending: Internal Medicine | Admitting: Internal Medicine

## 2014-09-27 ENCOUNTER — Other Ambulatory Visit (HOSPITAL_COMMUNITY): Payer: Self-pay | Admitting: Internal Medicine

## 2014-09-27 DIAGNOSIS — M810 Age-related osteoporosis without current pathological fracture: Secondary | ICD-10-CM | POA: Insufficient documentation

## 2014-09-27 MED ORDER — DENOSUMAB 60 MG/ML ~~LOC~~ SOLN
60.0000 mg | Freq: Once | SUBCUTANEOUS | Status: AC
  Administered 2014-09-27: 60 mg via SUBCUTANEOUS
  Filled 2014-09-27: qty 1

## 2014-09-27 NOTE — Discharge Instructions (Signed)
Denosumab injection What is this medicine? DENOSUMAB (den oh sue mab) slows bone breakdown. Prolia is used to treat osteoporosis in women after menopause and in men. Xgeva is used to prevent bone fractures and other bone problems caused by cancer bone metastases. Xgeva is also used to treat giant cell tumor of the bone. This medicine may be used for other purposes; ask your health care provider or pharmacist if you have questions. COMMON BRAND NAME(S): Prolia, XGEVA What should I tell my health care provider before I take this medicine? They need to know if you have any of these conditions: -dental disease -eczema -infection or history of infections -kidney disease or on dialysis -low blood calcium or vitamin D -malabsorption syndrome -scheduled to have surgery or tooth extraction -taking medicine that contains denosumab -thyroid or parathyroid disease -an unusual reaction to denosumab, other medicines, foods, dyes, or preservatives -pregnant or trying to get pregnant -breast-feeding How should I use this medicine? This medicine is for injection under the skin. It is given by a health care professional in a hospital or clinic setting. If you are getting Prolia, a special MedGuide will be given to you by the pharmacist with each prescription and refill. Be sure to read this information carefully each time. For Prolia, talk to your pediatrician regarding the use of this medicine in children. Special care may be needed. For Xgeva, talk to your pediatrician regarding the use of this medicine in children. While this drug may be prescribed for children as young as 13 years for selected conditions, precautions do apply. Overdosage: If you think you've taken too much of this medicine contact a poison control center or emergency room at once. Overdosage: If you think you have taken too much of this medicine contact a poison control center or emergency room at once. NOTE: This medicine is only for  you. Do not share this medicine with others. What if I miss a dose? It is important not to miss your dose. Call your doctor or health care professional if you are unable to keep an appointment. What may interact with this medicine? Do not take this medicine with any of the following medications: -other medicines containing denosumab This medicine may also interact with the following medications: -medicines that suppress the immune system -medicines that treat cancer -steroid medicines like prednisone or cortisone This list may not describe all possible interactions. Give your health care provider a list of all the medicines, herbs, non-prescription drugs, or dietary supplements you use. Also tell them if you smoke, drink alcohol, or use illegal drugs. Some items may interact with your medicine. What should I watch for while using this medicine? Visit your doctor or health care professional for regular checks on your progress. Your doctor or health care professional may order blood tests and other tests to see how you are doing. Call your doctor or health care professional if you get a cold or other infection while receiving this medicine. Do not treat yourself. This medicine may decrease your body's ability to fight infection. You should make sure you get enough calcium and vitamin D while you are taking this medicine, unless your doctor tells you not to. Discuss the foods you eat and the vitamins you take with your health care professional. See your dentist regularly. Brush and floss your teeth as directed. Before you have any dental work done, tell your dentist you are receiving this medicine. Do not become pregnant while taking this medicine or for 5 months after stopping   it. Women should inform their doctor if they wish to become pregnant or think they might be pregnant. There is a potential for serious side effects to an unborn child. Talk to your health care professional or pharmacist for more  information. What side effects may I notice from receiving this medicine? Side effects that you should report to your doctor or health care professional as soon as possible: -allergic reactions like skin rash, itching or hives, swelling of the face, lips, or tongue -breathing problems -chest pain -fast, irregular heartbeat -feeling faint or lightheaded, falls -fever, chills, or any other sign of infection -muscle spasms, tightening, or twitches -numbness or tingling -skin blisters or bumps, or is dry, peels, or red -slow healing or unexplained pain in the mouth or jaw -unusual bleeding or bruising Side effects that usually do not require medical attention (Report these to your doctor or health care professional if they continue or are bothersome.): -muscle pain -stomach upset, gas This list may not describe all possible side effects. Call your doctor for medical advice about side effects. You may report side effects to FDA at 1-800-FDA-1088. Where should I keep my medicine? This medicine is only given in a clinic, doctor's office, or other health care setting and will not be stored at home. NOTE: This sheet is a summary. It may not cover all possible information. If you have questions about this medicine, talk to your doctor, pharmacist, or health care provider.  2015, Elsevier/Gold Standard. (2011-06-24 12:37:47)  

## 2014-10-06 ENCOUNTER — Ambulatory Visit
Admission: RE | Admit: 2014-10-06 | Discharge: 2014-10-06 | Disposition: A | Discharge status: Home / Self Care | Payer: Medicare Other | Source: Ambulatory Visit | Attending: Obstetrics & Gynecology | Admitting: Obstetrics & Gynecology

## 2014-10-06 DIAGNOSIS — Z9889 Other specified postprocedural states: Secondary | ICD-10-CM

## 2014-10-12 ENCOUNTER — Encounter (HOSPITAL_COMMUNITY): Payer: Self-pay

## 2014-10-18 ENCOUNTER — Ambulatory Visit: Payer: Self-pay | Admitting: Gastroenterology

## 2014-11-24 ENCOUNTER — Ambulatory Visit: Payer: Self-pay | Admitting: Obstetrics and Gynecology

## 2014-11-29 ENCOUNTER — Ambulatory Visit (INDEPENDENT_AMBULATORY_CARE_PROVIDER_SITE_OTHER): Payer: Medicare Other | Admitting: Obstetrics and Gynecology

## 2014-11-29 ENCOUNTER — Encounter: Payer: Self-pay | Admitting: Obstetrics and Gynecology

## 2014-11-29 VITALS — BP 118/70 | HR 72 | Resp 16 | Ht 62.5 in | Wt 149.0 lb

## 2014-11-29 DIAGNOSIS — Z01419 Encounter for gynecological examination (general) (routine) without abnormal findings: Secondary | ICD-10-CM

## 2014-11-29 DIAGNOSIS — C50911 Malignant neoplasm of unspecified site of right female breast: Secondary | ICD-10-CM | POA: Diagnosis not present

## 2014-11-29 DIAGNOSIS — N3946 Mixed incontinence: Secondary | ICD-10-CM | POA: Diagnosis not present

## 2014-11-29 DIAGNOSIS — N3281 Overactive bladder: Secondary | ICD-10-CM | POA: Diagnosis not present

## 2014-11-29 LAB — URINALYSIS, MICROSCOPIC ONLY
CRYSTALS: NONE SEEN [HPF]
Casts: NONE SEEN [LPF]
RBC / HPF: NONE SEEN RBC/HPF (ref <=2)
Yeast: NONE SEEN [HPF]

## 2014-11-29 NOTE — Patient Instructions (Signed)
EXERCISE AND DIET:  We recommended that you start or continue a regular exercise program for good health. Regular exercise means any activity that makes your heart beat faster and makes you sweat.  We recommend exercising at least 30 minutes per day at least 3 days a week, preferably 4 or 5.  We also recommend a diet low in fat and sugar.  Inactivity, poor dietary choices and obesity can cause diabetes, heart attack, stroke, and kidney damage, among others.    ALCOHOL AND SMOKING:  Women should limit their alcohol intake to no more than 7 drinks/beers/glasses of wine (combined, not each!) per week. Moderation of alcohol intake to this level decreases your risk of breast cancer and liver damage. And of course, no recreational drugs are part of a healthy lifestyle.  And absolutely no smoking or even second hand smoke. Most people know smoking can cause heart and lung diseases, but did you know it also contributes to weakening of your bones? Aging of your skin?  Yellowing of your teeth and nails?  CALCIUM AND VITAMIN D:  Adequate intake of calcium and Vitamin D are recommended.  The recommendations for exact amounts of these supplements seem to change often, but generally speaking 600 mg of calcium (either carbonate or citrate) and 800 units of Vitamin D per day seems prudent. Certain women may benefit from higher intake of Vitamin D.  If you are among these women, your doctor will have told you during your visit.    PAP SMEARS:  Pap smears, to check for cervical cancer or precancers,  have traditionally been done yearly, although recent scientific advances have shown that most women can have pap smears less often.  However, every woman still should have a physical exam from her gynecologist every year. It will include a breast check, inspection of the vulva and vagina to check for abnormal growths or skin changes, a visual exam of the cervix, and then an exam to evaluate the size and shape of the uterus and  ovaries.  And after 69 years of age, a rectal exam is indicated to check for rectal cancers. We will also provide age appropriate advice regarding health maintenance, like when you should have certain vaccines, screening for sexually transmitted diseases, bone density testing, colonoscopy, mammograms, etc.   MAMMOGRAMS:  All women over 40 years old should have a yearly mammogram. Many facilities now offer a "3D" mammogram, which may cost around $50 extra out of pocket. If possible,  we recommend you accept the option to have the 3D mammogram performed.  It both reduces the number of women who will be called back for extra views which then turn out to be normal, and it is better than the routine mammogram at detecting truly abnormal areas.    COLONOSCOPY:  Colonoscopy to screen for colon cancer is recommended for all women at age 50.  We know, you hate the idea of the prep.  We agree, BUT, having colon cancer and not knowing it is worse!!  Colon cancer so often starts as a polyp that can be seen and removed at colonscopy, which can quite literally save your life!  And if your first colonoscopy is normal and you have no family history of colon cancer, most women don't have to have it again for 10 years.  Once every ten years, you can do something that may end up saving your life, right?  We will be happy to help you get it scheduled when you are ready.    Be sure to check your insurance coverage so you understand how much it will cost.  It may be covered as a preventative service at no cost, but you should check your particular policy.     Kegel Exercises The goal of Kegel exercises is to isolate and exercise your pelvic floor muscles. These muscles act as a hammock that supports the rectum, vagina, small intestine, and uterus. As the muscles weaken, the hammock sags and these organs are displaced from their normal positions. Kegel exercises can strengthen your pelvic floor muscles and help you to improve  bladder and bowel control, improve sexual response, and help reduce many problems and some discomfort during pregnancy. Kegel exercises can be done anywhere and at any time. HOW TO PERFORM KEGEL EXERCISES 1. Locate your pelvic floor muscles. To do this, squeeze (contract) the muscles that you use when you try to stop the flow of urine. You will feel a tightness in the vaginal area (women) and a tight lift in the rectal area (men and women). 2. When you begin, contract your pelvic muscles tight for 2-5 seconds, then relax them for 2-5 seconds. This is one set. Do 4-5 sets with a short pause in between. 3. Contract your pelvic muscles for 8-10 seconds, then relax them for 8-10 seconds. Do 4-5 sets. If you cannot contract your pelvic muscles for 8-10 seconds, try 5-7 seconds and work your way up to 8-10 seconds. Your goal is 4-5 sets of 10 contractions each day. Keep your stomach, buttocks, and legs relaxed during the exercises. Perform sets of both short and long contractions. Vary your positions. Perform these contractions 3-4 times per day. Perform sets while you are:   Lying in bed in the morning.  Standing at lunch.  Sitting in the late afternoon.  Lying in bed at night. You should do 40-50 contractions per day. Do not perform more Kegel exercises per day than recommended. Overexercising can cause muscle fatigue. Continue these exercises for for at least 15-20 weeks or as directed by your caregiver.   This information is not intended to replace advice given to you by your health care provider. Make sure you discuss any questions you have with your health care provider.   Document Released: 12/11/2011 Document Revised: 01/14/2014 Document Reviewed: 12/11/2011 Elsevier Interactive Patient Education 2016 [employer]  

## 2014-11-29 NOTE — Progress Notes (Signed)
Patient ID: Kelsey Mueller, female   DOB: 18-Nov-1945, 69 y.o.   MRN: [id number] 69 y.o. G3P3 MarriedCaucasianF here for annual exam.  The patient has a h/o right breast cancer, s/p lumpectomy and radiation. Negative genetic testing. She c/o more frequent urination over the last 6-9 months, intermittent nocturia x 1. She is voiding as often as every half hour at times, small amounts. Drinks a lot of liquids, she is drinking ice tea and diet coke. About 2 caffienated drinks a day. She can leak with exercise or on the way to the bathroom. Going up a high step can cause it to happen. Leaks small amounts 3-4 x a week. Stress incontinence is worse than urge.   No vaginal bleeding. Sexually active, no pain, loss of desire.  She has a h/o depression, gained weight on prozac, zoloft didn't work, wellbutrin made her shake, another one made her sleepy.    Patient's last menstrual period was 01/07/1997.          Sexually active: Yes.    The current method of family planning is post menopausal status.    Exercising: Yes.    bike/gym/ PT Smoker:  no  Health Maintenance: Pap:  09-28-12 WNL  History of abnormal Pap:  no MMG:  10-06-14 WNL  Colonoscopy:  02-24-2004 BMD:   04-05-13 Low Bone Mass TDaP:  Unsure, thinks UTD Gardasil: N/A   reports that she has never smoked. She has never used smokeless tobacco. She reports that she does not drink alcohol or use illicit drugs.Rare ETOH use. 3 kids, 4 grandchildren. All her kids live here.   Past Medical History  Diagnosis Date  . Osteoporosis   . GERD (gastroesophageal reflux disease)   . Depression   . Obesity   . Allergic rhinoconjunctivitis   . Infertility     took clomid with second pregnancy  . Polio     age 65 month old-"slight case"  . Knee injury 06/13/12    fell, cracked femur under knee cap-right  . Environmental allergies   . Hypertension   . Heart murmur     slight  . Asthma     very mild  . Pneumonia     hx of  . Migraine     hx of  menstrual  . Arthritis   . Anemia     hx of  . Breast cancer [facility])     s/p XRT and lumpectomy 2009  . Left-sided weakness     left arm due to polio  . Family history of breast cancer   . Family history of ovarian cancer     Past Surgical History  Procedure Laterality Date  . Nissen fundoplication  [id number]  . Hiatal hernia repair  2005    with Nissen   . Breast lumpectomy  2009    R. Eston Esters   . Breast biopsy      right breast ER/PR +  . Tonsillectomy  69 years old  . Tubal ligation  1976  . Total knee arthroplasty Right 11/15/2013    Procedure: RIGHT TOTAL KNEE ARTHROPLASTY;  Surgeon: Gearlean Alf, MD;  Location: [facility];  Service: Orthopedics;  Laterality: Right;    Current Outpatient Prescriptions  Medication Sig Dispense Refill  . acetaminophen (TYLENOL) 325 MG tablet Take 650 mg by mouth every 6 (six) hours as needed for mild pain or headache.    . calcium carbonate (OS-CAL - DOSED IN MG OF ELEMENTAL CALCIUM) 1250 (500 CA) MG tablet  Take 2 tablets by mouth.    . cholecalciferol (VITAMIN D) 1000 UNITS tablet Take 2,000 Units by mouth daily.    Marland Kitchen denosumab (PROLIA) 60 MG/ML SOLN injection Inject 60 mg into the skin every 6 (six) months. Administer in upper arm, thigh, or abdomen    . diclofenac (VOLTAREN) 25 MG EC tablet Take 25 mg by mouth as needed.    . famotidine-calcium carbonate-magnesium hydroxide (PEPCID COMPLETE) 10-800-165 MG CHEW chewable tablet Chew 1 tablet by mouth daily as needed.    . fexofenadine (ALLEGRA) 180 MG tablet Take 180 mg by mouth daily as needed for allergies.     . hydrochlorothiazide (,MICROZIDE/HYDRODIURIL,) 12.5 MG capsule Take 12.5 mg by mouth every morning.     . lactobacillus acidophilus (BACID) TABS tablet Take 1 tablet by mouth daily.    . Multiple Vitamin (MULTIVITAMIN) tablet Take 1 tablet by mouth daily.    . pantoprazole (PROTONIX) 40 MG tablet Take 40 mg by mouth every morning.    . zolpidem (AMBIEN) 5 MG tablet Take 5 mg by mouth  at bedtime as needed for sleep.      No current facility-administered medications for this visit.    Family History  Problem Relation Age of Onset  . Coronary artery disease Neg Hx   . Breast cancer Paternal Aunt     dx in her 72s, died in her 31s  . Cancer Maternal Uncle     NOS  . Ovarian cancer Maternal Grandmother   . Breast cancer Paternal Aunt     dx in her 49s; bilateral breast cancer; double mastectomy  . Breast cancer Cousin     bilateral breast cancer in her 86s    Review of Systems  Constitutional: Negative.   HENT: Negative.   Eyes: Negative.   Respiratory: Negative.   Cardiovascular: Negative.   Gastrointestinal: Negative.   Endocrine: Negative.   Genitourinary: Negative.        Night urination Loss of urine spontaneously  night urination  Loss of sexual interest   Musculoskeletal: Negative.   Skin: Negative.   Allergic/Immunologic: Negative.   Neurological: Negative.   Psychiatric/Behavioral: Negative.        Depression    Exam:   BP 118/70 mmHg  Pulse 72  Resp 16  Ht 5' 2.5" (1.588 m)  Wt 149 lb (67.586 kg)  BMI 26.80 kg/m2  LMP 01/07/1997  Weight change: @WEIGHTCHANGE @ Height:   Height: 5' 2.5" (158.8 cm)  Ht Readings from Last 3 Encounters:  11/29/14 5' 2.5" (1.588 m)  09/27/14 5\' 2"  (1.575 m)  11/15/13 5\' 2"  (1.575 m)    General appearance: alert, cooperative and appears stated age Head: Normocephalic, without obvious abnormality, atraumatic Neck: no adenopathy, supple, symmetrical, trachea midline and thyroid normal to inspection and palpation Lungs: clear to auscultation bilaterally Breasts: normal appearance, no masses or tenderness, evidence of right lumpectomy Heart: regular rate and rhythm Abdomen: soft, non-tender; bowel sounds normal; no masses,  no organomegaly Extremities: extremities normal, atraumatic, no cyanosis or edema Skin: Skin color, texture, turgor normal. No rashes or lesions Lymph nodes: Cervical, supraclavicular,  and axillary nodes normal. No abnormal inguinal nodes palpated Neurologic: Grossly normal   Pelvic: External genitalia:  no lesions              Urethra:  normal appearing urethra with no masses, tenderness or lesions              Bartholins and Skenes: normal  Vagina: normal appearing vagina with normal color and discharge, no lesions. Atrophic              Cervix: no lesions               Bimanual Exam:  Uterus:  normal size, contour, position, consistency, mobility, non-tender              Adnexa: no mass, fullness, tenderness               Rectovaginal: Confirms               Anus:  normal sphincter tone, no lesions  Chaperone was present for exam.  A:  Well Woman with normal exam  Urinary frequency  Mixed urinary incontinence  Osteopenia (h/o osteoporosis, on several prior treatments)  H/O breast cancer, negative genetic testing  P:   No pap this year  Labs and immunizations with primary  DEXA at [facility] in 3/17  Hold on further Prolia until f/u DEXA, Last T score -1.7  Continue calcium and vit D  Breast self exams  Mammogram UTD  Colonoscopy due  Urine for ua, c&s  Discussed decreasing caffeine   Discussed PT in office and at home  Discussed medication for OAB symptoms (not yet)  In addition to the annual exam 10 minutes was spent on counseling on incontinence and overactive bladder symptoms

## 2014-11-30 LAB — URINE CULTURE

## 2015-01-08 HISTORY — PX: COLONOSCOPY: SHX174

## 2015-05-09 ENCOUNTER — Ambulatory Visit (INDEPENDENT_AMBULATORY_CARE_PROVIDER_SITE_OTHER): Payer: Medicare Other | Admitting: Obstetrics and Gynecology

## 2015-05-09 ENCOUNTER — Encounter: Payer: Self-pay | Admitting: Obstetrics and Gynecology

## 2015-05-09 VITALS — BP 118/66 | HR 76 | Resp 16 | Ht 62.0 in | Wt 138.0 lb

## 2015-05-09 DIAGNOSIS — IMO0002 Reserved for concepts with insufficient information to code with codable children: Secondary | ICD-10-CM

## 2015-05-09 DIAGNOSIS — N8110 Cystocele, unspecified: Secondary | ICD-10-CM

## 2015-05-09 DIAGNOSIS — N3946 Mixed incontinence: Secondary | ICD-10-CM

## 2015-05-09 NOTE — Progress Notes (Signed)
GYNECOLOGY  VISIT   HPI: 70 y.o.   Married  Caucasian  female   G3P3 with Patient's last menstrual period was 01/07/1997.   here for   Urinary incontinence getting worse. She has a h/o mixed incontinence. She is leaking with walking, cough, sneeze. She is occuring every day, multiple x a day. She can leak small to large amounts. She is wearing a pad and voiding frequently to try and decrease her leakage. The leaking on the way to the bathroom is more tolerable, occuring less often because she is voiding often to prevent a problem with leakage. Nocturia x 1 in the last 6 months. Sometimes she feels she is empty, 5-10 minutes later she can leak a small amount. She denies having a vaginal bulge or vaginal pressure. No difficulty with BM.  She is sexually active without pain.  GYNECOLOGIC HISTORY: Patient's last menstrual period was 01/07/1997. Contraception:Post Menopausal Menopausal hormone therapy: none        OB History    Gravida Para Term Preterm AB TAB SAB Ectopic Multiple Living   3 3        3          Patient Active Problem List   Diagnosis Date Noted  . Family history of breast cancer   . Family history of ovarian cancer   . Genetic testing 08/19/2014  . OA (osteoarthritis) of knee 11/15/2013  . Cancer of right breast ([place]) 12/14/2012  . Breast cancer ([place]) 03/17/2012  . ALLERGIC RHINITIS 09/18/2008  . PALPITATIONS 08/30/2008  . CARDIAC MURMUR 08/30/2008  . DYSPNEA 08/30/2008  . ABNORMAL EKG 08/30/2008  . DEPRESSION 08/24/2008  . GERD 08/24/2008  . OSTEOPETROSIS 08/24/2008  . HYPERTENSION, HX OF 08/24/2008    Past Medical History  Diagnosis Date  . Osteoporosis   . GERD (gastroesophageal reflux disease)   . Depression   . Obesity   . Allergic rhinoconjunctivitis   . Infertility     took clomid with second pregnancy  . Polio     age 29 month old-"slight case"  . Knee injury 06/13/12    fell, cracked femur under knee cap-right  . Environmental allergies   .  Hypertension   . Heart murmur     slight  . Asthma     very mild  . Pneumonia     hx of  . Migraine     hx of menstrual  . Arthritis   . Anemia     hx of  . Breast cancer [facility])     s/p XRT and lumpectomy 2009  . Left-sided weakness     left arm due to polio  . Family history of breast cancer   . Family history of ovarian cancer     Past Surgical History  Procedure Laterality Date  . Nissen fundoplication  [id number]  . Hiatal hernia repair  2005    with Nissen   . Breast lumpectomy  2009    R. Eston Esters   . Breast biopsy      right breast ER/PR +  . Tonsillectomy  70 years old  . Tubal ligation  1976  . Total knee arthroplasty Right 11/15/2013    Procedure: RIGHT TOTAL KNEE ARTHROPLASTY;  Surgeon: Gearlean Alf, MD;  Location: [facility];  Service: Orthopedics;  Laterality: Right;    Current Outpatient Prescriptions  Medication Sig Dispense Refill  . bifidobacterium infantis (ALIGN) capsule Take 1 capsule by mouth daily.    . cholecalciferol (VITAMIN D) 1000 UNITS  tablet Take 2,000 Units by mouth daily.    Marland Kitchen denosumab (PROLIA) 60 MG/ML SOLN injection Inject 60 mg into the skin every 6 (six) months. Administer in upper arm, thigh, or abdomen    . famotidine-calcium carbonate-magnesium hydroxide (PEPCID COMPLETE) 10-800-165 MG CHEW chewable tablet Chew 1 tablet by mouth daily as needed.    . fexofenadine (ALLEGRA) 180 MG tablet Take 180 mg by mouth daily as needed for allergies.     . hydrochlorothiazide (,MICROZIDE/HYDRODIURIL,) 12.5 MG capsule Take 12.5 mg by mouth every morning.     . Multiple Vitamin (MULTIVITAMIN) tablet Take 1 tablet by mouth daily.    . Psyllium (METAMUCIL FIBER PO) Take by mouth daily.    Marland Kitchen zolpidem (AMBIEN) 5 MG tablet Take 5 mg by mouth at bedtime as needed for sleep.     Marland Kitchen acetaminophen (TYLENOL) 325 MG tablet Take 650 mg by mouth every 6 (six) hours as needed for mild pain or headache. Reported on 05/09/2015    . lactobacillus acidophilus (BACID) TABS  tablet Take 1 tablet by mouth daily.     No current facility-administered medications for this visit.     ALLERGIES: Review of patient's allergies indicates no known allergies.  Family History  Problem Relation Age of Onset  . Coronary artery disease Neg Hx   . Breast cancer Paternal Aunt     dx in her 56s, died in her 79s  . Cancer Maternal Uncle     NOS  . Ovarian cancer Maternal Grandmother   . Breast cancer Paternal Aunt     dx in her 67s; bilateral breast cancer; double mastectomy  . Breast cancer Cousin     bilateral breast cancer in her 66s    Social History   Social History  . Marital Status: Married    Spouse Name: N/A  . Number of Children: 3  . Years of Education: N/A   Occupational History  . Not on file.   Social History Main Topics  . Smoking status: Never Smoker   . Smokeless tobacco: Never Used  . Alcohol Use: No     Comment: rare  . Drug Use: No  . Sexual Activity:    Partners: Male    Birth Control/ Protection: Post-menopausal   Other Topics Concern  . Not on file   Social History Narrative   Married. Retired Pharmacist, hospital Therapist, art)     Review of Systems  Constitutional: Negative.   HENT: Negative.   Eyes: Negative.   Respiratory: Negative.   Cardiovascular: Negative.   Gastrointestinal: Negative.   Genitourinary: Positive for frequency.       Loss of urine spontaneously Loss of urine with sneeze or cough Night Urination  Musculoskeletal: Negative.   Skin: Negative.   Neurological: Negative.   Endo/Heme/Allergies: Negative.   Psychiatric/Behavioral: Negative.     PHYSICAL EXAMINATION:    BP 118/66 mmHg  Pulse 76  Resp 16  Ht 5\' 2"  (1.575 m)  Wt 138 lb (62.596 kg)  BMI 25.23 kg/m2  LMP 01/07/1997    General appearance: alert, cooperative and appears stated age  Pelvic: External genitalia:  no lesions              Urethra:  normal appearing urethra with no masses, tenderness or lesions              Bartholins and  Skenes: normal                 Vagina: normal appearing atrophic vagina  with normal color and discharge, no lesions   She has a small grade 2 cystocele with valsalva, grade 1-2 rectocele, no significant uterine prolapse (not examined standing with valsalva, no prolapse c/o)              Cervix: no lesions              Bimanual Exam:  Uterus:  normal size, contour, position, consistency, mobility, non-tender              Adnexa: no mass, fullness, tenderness               Chaperone was present for exam.  ASSESSMENT Mixed incontinence, mainly stress incontinence, worsening Small grade 2 cystocele and rectocele    PLAN Send urine for culture Discussed options of do nothing, PT and surgery. She is interested in surgery Consultation with Dr Quincy Simmonds for a TVT Likely cystocele repair Discussed urodynamics, briefly discussed surgery Discussed avoiding heavy lifting and straining    An After Visit Summary was printed and given to the patient.  20 minutes face to face time of which over 50% was spent in counseling.

## 2015-05-10 LAB — URINE CULTURE

## 2015-05-10 LAB — URINALYSIS, MICROSCOPIC ONLY
BACTERIA UA: NONE SEEN [HPF]
Casts: NONE SEEN [LPF]
Crystals: NONE SEEN [HPF]
Yeast: NONE SEEN [HPF]

## 2015-05-12 ENCOUNTER — Ambulatory Visit: Payer: Medicare Other | Admitting: Obstetrics and Gynecology

## 2015-05-15 ENCOUNTER — Encounter: Payer: Self-pay | Admitting: Obstetrics and Gynecology

## 2015-05-15 ENCOUNTER — Ambulatory Visit (INDEPENDENT_AMBULATORY_CARE_PROVIDER_SITE_OTHER): Payer: Medicare Other | Admitting: Obstetrics and Gynecology

## 2015-05-15 VITALS — BP 122/70 | HR 68 | Resp 16 | Ht 62.0 in | Wt 137.0 lb

## 2015-05-15 DIAGNOSIS — N812 Incomplete uterovaginal prolapse: Secondary | ICD-10-CM | POA: Diagnosis not present

## 2015-05-15 DIAGNOSIS — N816 Rectocele: Secondary | ICD-10-CM | POA: Diagnosis not present

## 2015-05-15 DIAGNOSIS — N3946 Mixed incontinence: Secondary | ICD-10-CM | POA: Diagnosis not present

## 2015-05-15 DIAGNOSIS — R1900 Intra-abdominal and pelvic swelling, mass and lump, unspecified site: Secondary | ICD-10-CM | POA: Diagnosis not present

## 2015-05-15 DIAGNOSIS — IMO0002 Reserved for concepts with insufficient information to code with codable children: Secondary | ICD-10-CM

## 2015-05-15 DIAGNOSIS — N8110 Cystocele, unspecified: Secondary | ICD-10-CM

## 2015-05-15 NOTE — Progress Notes (Signed)
GYNECOLOGY  VISIT   HPI: 70 y.o.   Married  Caucasian  female   G3P3 with Patient's last menstrual period was 01/07/1997.   here for   Consult for urinary incontinence. Patient referred by her primary GYN, Dr. Talbert Nan.  Urinary incontinence for a couple of years, Worse over 6 months. Wears Always pads. Leaks with sneeze, exercise, walking the dog, picking up things from the floor.  Not leaking with sexually activity.  Leaks for no reason.  DF- not keeping track.  Maybe every 2 hours. NF - once per hs.  No hematuria, UTIs, pyelo, or stones.  States she is told her has a UTI every year after she does her PCP visit.  Takes abx periodically.  Last UC patient thinks she did not use a cleansing wipe.  Epithelial cells seen.   Can leak after just emptying, but does feel like she voids well.   Denies constipation.  Has IBS and diarrhea.  No fecal incontinence.  Taking Align and Metamucil.   No known pelvic organ prolapse per patient.  Status post BTL.   Largest delivery 9 pounds and 4 ounces.  Quick delivery.  No instrumentation or extensive tearing.  GYNECOLOGIC HISTORY: Patient's last menstrual period was 01/07/1997. Contraception:  Post Menopausal Menopausal hormone therapy:  none Last mammogram:  10/06/14 BIRADS 2 negative Last pap smear:   09/28/12 WNL        OB History    Gravida Para Term Preterm AB TAB SAB Ectopic Multiple Living   3 3        3          Patient Active Problem List   Diagnosis Date Noted  . Family history of breast cancer   . Family history of ovarian cancer   . Genetic testing 08/19/2014  . OA (osteoarthritis) of knee 11/15/2013  . Cancer of right breast ([place]) 12/14/2012  . Breast cancer ([place]) 03/17/2012  . ALLERGIC RHINITIS 09/18/2008  . PALPITATIONS 08/30/2008  . CARDIAC MURMUR 08/30/2008  . DYSPNEA 08/30/2008  . ABNORMAL EKG 08/30/2008  . DEPRESSION 08/24/2008  . GERD 08/24/2008  . OSTEOPETROSIS 08/24/2008  . HYPERTENSION, HX OF  08/24/2008    Past Medical History  Diagnosis Date  . Osteoporosis   . GERD (gastroesophageal reflux disease)   . Depression   . Obesity   . Allergic rhinoconjunctivitis   . Infertility     took clomid with second pregnancy  . Polio     age 18 month old-"slight case"  . Knee injury 06/13/12    fell, cracked femur under knee cap-right  . Environmental allergies   . Hypertension   . Heart murmur     slight  . Asthma     very mild  . Pneumonia     hx of  . Migraine     hx of menstrual  . Arthritis   . Anemia     hx of  . Breast cancer [facility])     s/p XRT and lumpectomy 2009  . Left-sided weakness     left arm due to polio  . Family history of breast cancer   . Family history of ovarian cancer     Past Surgical History  Procedure Laterality Date  . Nissen fundoplication  [id number]  . Hiatal hernia repair  2005    with Nissen   . Breast lumpectomy  2009    R. Eston Esters   . Breast biopsy      right breast ER/PR +  .  Tonsillectomy  70 years old  . Tubal ligation  1976  . Total knee arthroplasty Right 11/15/2013    Procedure: RIGHT TOTAL KNEE ARTHROPLASTY;  Surgeon: Gearlean Alf, MD;  Location: [facility];  Service: Orthopedics;  Laterality: Right;    Current Outpatient Prescriptions  Medication Sig Dispense Refill  . acetaminophen (TYLENOL) 325 MG tablet Take 650 mg by mouth every 6 (six) hours as needed for mild pain or headache. Reported on 05/09/2015    . bifidobacterium infantis (ALIGN) capsule Take 1 capsule by mouth daily.    . cholecalciferol (VITAMIN D) 1000 UNITS tablet Take 2,000 Units by mouth daily.    Marland Kitchen denosumab (PROLIA) 60 MG/ML SOLN injection Inject 60 mg into the skin every 6 (six) months. Administer in upper arm, thigh, or abdomen    . famotidine-calcium carbonate-magnesium hydroxide (PEPCID COMPLETE) 10-800-165 MG CHEW chewable tablet Chew 1 tablet by mouth daily as needed.    . fexofenadine (ALLEGRA) 180 MG tablet Take 180 mg by mouth daily as needed for  allergies.     . hydrochlorothiazide (,MICROZIDE/HYDRODIURIL,) 12.5 MG capsule Take 12.5 mg by mouth every morning.     . lactobacillus acidophilus (BACID) TABS tablet Take 1 tablet by mouth daily.    . Multiple Vitamin (MULTIVITAMIN) tablet Take 1 tablet by mouth daily.    . Psyllium (METAMUCIL FIBER PO) Take by mouth daily.    Marland Kitchen zolpidem (AMBIEN) 5 MG tablet Take 5 mg by mouth at bedtime as needed for sleep.      No current facility-administered medications for this visit.     ALLERGIES: Review of patient's allergies indicates no known allergies.  Family History  Problem Relation Age of Onset  . Coronary artery disease Neg Hx   . Breast cancer Paternal Aunt     dx in her 61s, died in her 20s  . Cancer Maternal Uncle     NOS  . Ovarian cancer Maternal Grandmother   . Breast cancer Paternal Aunt     dx in her 15s; bilateral breast cancer; double mastectomy  . Breast cancer Cousin     bilateral breast cancer in her 15s    Social History   Social History  . Marital Status: Married    Spouse Name: N/A  . Number of Children: 3  . Years of Education: N/A   Occupational History  . Not on file.   Social History Main Topics  . Smoking status: Never Smoker   . Smokeless tobacco: Never Used  . Alcohol Use: No     Comment: rare  . Drug Use: No  . Sexual Activity:    Partners: Male    Birth Control/ Protection: Post-menopausal   Other Topics Concern  . Not on file   Social History Narrative   Married. Retired Pharmacist, hospital Therapist, art)     ROS:  Pertinent items are noted in HPI.  PHYSICAL EXAMINATION:    BP 122/70 mmHg  Pulse 68  Resp 16  Ht 5\' 2"  (1.575 m)  Wt 137 lb (62.143 kg)  BMI 25.05 kg/m2  LMP 01/07/1997    General appearance: alert, cooperative and appears stated age   Abdomen: incision(s):No.,   soft, non-tender, no masses,  no organomegaly    Pelvic: External genitalia:  no lesions              Urethra:  normal appearing urethra with no masses,  tenderness or lesions              Bartholins  and Skenes: normal                 Vagina: normal appearing vagina with normal color and discharge, no lesions.  Second degree cytocele, first degree uterine prolapse, second degree rectocele.              Cervix: no lesions                Bimanual Exam:  Uterus:  normal size, contour, position, consistency, mobility, non-tender              Adnexa: 3 cm right adnexal mass - nontender.  No left adnexal mass or tenderness.              Rectal exam: Yes.  .  Confirms.              Anus:  normal sphincter tone, no lesions  Chaperone was present for exam.  ASSESSMENT  Incomplete uterovaginal prolapse. Mixed incontinence. Possible right adnexal mass versus bowel loop. IBS.  Hx polio.  FH breast and ovarian cancer.  PLAN  Discussion of prolapse and mixed incontinence - etiologies and treatment.  We reviewed options for care including medical therapy - anticholinergic treatment, physical therapy and pessary use and surgical therapy of LAVH/BSO/anterior and posterior colporrhaphy and possible midurethral sling and cystoscopy. We reviewed urodynamic testing if surgical care is pursued.  Procedure explained.  Will have patient return for a pelvic ultrasound and follow up with Dr. Talbert Nan to evaluate right adnexa. ACOG handouts on prolapse and incontinence provided to patient.  Patient will consider her tx options.     An After Visit Summary was printed and given to the patient.  __40____ minutes face to face time of which over 50% was spent in counseling.

## 2015-05-15 NOTE — Patient Instructions (Signed)
The surgery would be an over night stay in the hospital.  Immediate surgical recovery is 6 weeks.  For 3 months, you would not be able to lift, carry, pull, or haul more than 10 pounds.  You would not be able to have sexual activity for 3 months also.

## 2015-05-16 ENCOUNTER — Telehealth: Payer: Self-pay | Admitting: Obstetrics and Gynecology

## 2015-05-16 NOTE — Telephone Encounter (Signed)
Spoke with pt regarding benefit for ultrasound. Kelsey Mueller understood and agreeable. Kelsey Mueller ready to schedule. Kelsey Mueller scheduled 05/23/15 with Dr Talbert Nan. Pt aware of arrival date and time. Pt aware of 72 hours cancellation policy with [id number] fee. No further questions. Ok to close

## 2015-05-23 ENCOUNTER — Ambulatory Visit (INDEPENDENT_AMBULATORY_CARE_PROVIDER_SITE_OTHER): Payer: Medicare Other | Admitting: Obstetrics and Gynecology

## 2015-05-23 ENCOUNTER — Ambulatory Visit (INDEPENDENT_AMBULATORY_CARE_PROVIDER_SITE_OTHER): Payer: Medicare Other

## 2015-05-23 ENCOUNTER — Encounter: Payer: Self-pay | Admitting: Obstetrics and Gynecology

## 2015-05-23 VITALS — BP 112/74 | HR 72 | Resp 15 | Wt 136.0 lb

## 2015-05-23 DIAGNOSIS — N3946 Mixed incontinence: Secondary | ICD-10-CM | POA: Diagnosis not present

## 2015-05-23 DIAGNOSIS — N819 Female genital prolapse, unspecified: Secondary | ICD-10-CM

## 2015-05-23 DIAGNOSIS — R1900 Intra-abdominal and pelvic swelling, mass and lump, unspecified site: Secondary | ICD-10-CM | POA: Diagnosis not present

## 2015-05-23 NOTE — Progress Notes (Signed)
Patient ID: Kelsey Mueller, female   DOB: 1945/03/06, 70 y.o.   MRN: [id number] GYNECOLOGY  VISIT   HPI: 70 y.o.   Married  Caucasian  female   G3P3 with Patient's last menstrual period was 01/07/1997.   here for pelvic U/S for a ? of an adnexal masson recent exam with Dr Quincy Simmonds. She was referred to discuss possible TVT for her mixed incontinence (stress>urge).  The patient has been diagnosed with mild pelvic prolapse, not symptomatic. The patient has a h/o right breast cancer, s/p lumpectomy and radiation. Negative genetic testing ? Family history of ovarian cancer in her grandmother in her 58's. It may have been stomach cancer. Not sure and no one is living that can confirm.  The patient denies any vaginal bleeding.    GYNECOLOGIC HISTORY: Patient's last menstrual period was 01/07/1997. Contraception:postmenopause  Menopausal hormone therapy: None         OB History    Gravida Para Term Preterm AB TAB SAB Ectopic Multiple Living   3 3        3          Patient Active Problem List   Diagnosis Date Noted  . Family history of breast cancer   . Family history of ovarian cancer   . Genetic testing 08/19/2014  . OA (osteoarthritis) of knee 11/15/2013  . Cancer of right breast ([place]) 12/14/2012  . Breast cancer ([place]) 03/17/2012  . ALLERGIC RHINITIS 09/18/2008  . PALPITATIONS 08/30/2008  . CARDIAC MURMUR 08/30/2008  . DYSPNEA 08/30/2008  . ABNORMAL EKG 08/30/2008  . DEPRESSION 08/24/2008  . GERD 08/24/2008  . OSTEOPETROSIS 08/24/2008  . HYPERTENSION, HX OF 08/24/2008    Past Medical History  Diagnosis Date  . Osteoporosis   . GERD (gastroesophageal reflux disease)   . Depression   . Obesity   . Allergic rhinoconjunctivitis   . Infertility     took clomid with second pregnancy  . Polio     age 64 month old-"slight case"  . Knee injury 06/13/12    fell, cracked femur under knee cap-right  . Environmental allergies   . Hypertension   . Heart murmur     slight  . Asthma    very mild  . Pneumonia     hx of  . Migraine     hx of menstrual  . Arthritis   . Anemia     hx of  . Breast cancer [facility])     s/p XRT and lumpectomy 2009  . Left-sided weakness     left arm due to polio  . Family history of breast cancer   . Family history of ovarian cancer     Past Surgical History  Procedure Laterality Date  . Nissen fundoplication  [id number]  . Hiatal hernia repair  2005    with Nissen   . Breast lumpectomy  2009    R. Eston Esters   . Breast biopsy      right breast ER/PR +  . Tonsillectomy  70 years old  . Tubal ligation  1976  . Total knee arthroplasty Right 11/15/2013    Procedure: RIGHT TOTAL KNEE ARTHROPLASTY;  Surgeon: Gearlean Alf, MD;  Location: [facility];  Service: Orthopedics;  Laterality: Right;    Current Outpatient Prescriptions  Medication Sig Dispense Refill  . acetaminophen (TYLENOL) 325 MG tablet Take 650 mg by mouth every 6 (six) hours as needed for mild pain or headache. Reported on 05/09/2015    . bifidobacterium infantis (ALIGN)  capsule Take 1 capsule by mouth daily.    . cholecalciferol (VITAMIN D) 1000 UNITS tablet Take 2,000 Units by mouth daily.    Marland Kitchen denosumab (PROLIA) 60 MG/ML SOLN injection Inject 60 mg into the skin every 6 (six) months. Administer in upper arm, thigh, or abdomen    . famotidine-calcium carbonate-magnesium hydroxide (PEPCID COMPLETE) 10-800-165 MG CHEW chewable tablet Chew 1 tablet by mouth daily as needed.    . fexofenadine (ALLEGRA) 180 MG tablet Take 180 mg by mouth daily as needed for allergies.     . hydrochlorothiazide (,MICROZIDE/HYDRODIURIL,) 12.5 MG capsule Take 12.5 mg by mouth every morning.     . lactobacillus acidophilus (BACID) TABS tablet Take 1 tablet by mouth daily.    . Multiple Vitamin (MULTIVITAMIN) tablet Take 1 tablet by mouth daily.    . naproxen (NAPROSYN) 500 MG tablet Take 500 mg by mouth as needed.    . Psyllium (METAMUCIL FIBER PO) Take by mouth daily.    Marland Kitchen zolpidem (AMBIEN) 5 MG tablet  Take 5 mg by mouth at bedtime as needed for sleep.      No current facility-administered medications for this visit.     ALLERGIES: Review of patient's allergies indicates no known allergies.  Family History  Problem Relation Age of Onset  . Coronary artery disease Neg Hx   . Breast cancer Paternal Aunt     dx in her 70s, died in her 33s  . Cancer Maternal Uncle     NOS  . Ovarian cancer Maternal Grandmother   . Breast cancer Paternal Aunt     dx in her 60s; bilateral breast cancer; double mastectomy  . Breast cancer Cousin     bilateral breast cancer in her 25s    Social History   Social History  . Marital Status: Married    Spouse Name: N/A  . Number of Children: 3  . Years of Education: N/A   Occupational History  . Not on file.   Social History Main Topics  . Smoking status: Never Smoker   . Smokeless tobacco: Never Used  . Alcohol Use: No     Comment: rare  . Drug Use: No  . Sexual Activity:    Partners: Male    Birth Control/ Protection: Post-menopausal   Other Topics Concern  . Not on file   Social History Narrative   Married. Retired Pharmacist, hospital Therapist, art)     Review of Systems  Constitutional: Negative.   HENT: Negative.   Eyes: Negative.   Respiratory: Negative.   Cardiovascular: Negative.   Gastrointestinal: Negative.   Genitourinary: Negative.   Musculoskeletal: Negative.   Skin: Negative.   Neurological: Negative.   Endo/Heme/Allergies: Negative.   Psychiatric/Behavioral: Negative.     PHYSICAL EXAMINATION:    BP 112/74 mmHg  Pulse 72  Resp 15  Wt 136 lb (61.689 kg)  LMP 01/07/1997    General appearance: alert, cooperative and appears stated age   Ultrasound: please see attached report. Small uterus with an endometrial stripe of 1.19 mm and normal ovaries bilaterally. She did have a small amount of fluid in her uterus. Normal ovaries bilaterally.  ASSESSMENT Adnexal mass, normal ultrasound Mild pelvic prolapse, not  symptomatic Mixed incontinence, stress>urge Fluid in her uterus, given that her stripe is under 3 mm and she has no vaginal bleeding, no further w/u is needed ?FH of ovarian cancer, the patient is unsure if the cancer was ovarian or stomach. The patient herself has had negative genetic testing (she  has a h/o breast cancer)    PLAN The patient is interested in a TVT, but not a hysterectomy, rectocele repair. She is okay with a cystocele repair She is aware of her options of pelvic floor PT and a trial of medication for her incontinence, but prefers TVT I told her I thought it was reasonable to do the TVT, +/- cystocele repair, but would need to discuss it with Dr Quincy Simmonds The patient understands she may need surgery for prolapse in the future     An After Visit Summary was printed and given to the patient.  15 minutes face to face time of which over 50% was spent in counseling.

## 2015-05-24 ENCOUNTER — Telehealth: Payer: Self-pay | Admitting: Obstetrics and Gynecology

## 2015-05-24 DIAGNOSIS — R32 Unspecified urinary incontinence: Secondary | ICD-10-CM

## 2015-05-24 NOTE — Telephone Encounter (Signed)
Order entered to begin precert for urodynamic. Then will contact patient to schedule. Encounter closed.

## 2015-05-24 NOTE — Telephone Encounter (Signed)
Spoke with the patient, I discussed her case with Dr Quincy Simmonds and she is okay with doing a TVT, cystocele repair. We will set the patient up for urodynamics.  She also has been getting her prolia shots with her primary, but has had to go to [facility] for them. She would prefer to get them here. She had her DEXA at [facility] last week, I will review and make further recommendations. She would like to get her prolia here.

## 2015-05-25 ENCOUNTER — Telehealth: Payer: Self-pay | Admitting: Obstetrics and Gynecology

## 2015-05-25 NOTE — Telephone Encounter (Signed)
Spoke with the patient, reviewed her history. Osteopenia, she was on the biphosphatase for 10-15 years and then just switched to prolia. She doesn't know exactly why. Her most recent DEXA from this month shows a T score of -1.8 in her right hip, -1.6 in her left hip and -1.6 in her spine. Her right femur increased by 4%, her left femur by 11% and her spine is stable.  I told her to stop the Prolia and we will recheck her DEXA in 2 years.

## 2015-05-30 ENCOUNTER — Telehealth: Payer: Self-pay | Admitting: *Deleted

## 2015-06-02 NOTE — Telephone Encounter (Signed)
Call to patient. Per DPR, ok to leave message on home and cell number. Left message calling to schedule appointment/procedure. Left message to call back.

## 2015-06-06 ENCOUNTER — Telehealth: Payer: Self-pay | Admitting: *Deleted

## 2015-06-06 NOTE — Telephone Encounter (Signed)
Spoke with patient regarding BMD results. Advise patient bone density in femur, especially on the right side and also the spine are stable. Recommend that she speak with Dr.Shaw regarding her Prolia injections and when to stop these. She is agreeable and will contact Dr.Shaw to discuss this. Please see paper result with Dr.Miller's recommendations scanned into EPIC.  Routing to provider for final review. Patient agreeable to disposition. Will close encounter.

## 2015-06-06 NOTE — Telephone Encounter (Signed)
Patient says she is returning Emily's call.

## 2015-06-06 NOTE — Telephone Encounter (Signed)
Return call from patient. Due to multiple events and travel, declines to schedule urodynamics until 7-12 or 07-26-15. Patient will be unable to come in for pre-procedure urinalysis until 07-17-15.  Advised will call patient back with appointment date.

## 2015-06-07 ENCOUNTER — Encounter: Payer: Self-pay | Admitting: Obstetrics & Gynecology

## 2015-06-16 ENCOUNTER — Other Ambulatory Visit (HOSPITAL_COMMUNITY): Payer: Self-pay | Admitting: *Deleted

## 2015-06-19 ENCOUNTER — Encounter (HOSPITAL_COMMUNITY): Payer: Medicare Other

## 2015-06-21 ENCOUNTER — Telehealth: Payer: Self-pay | Admitting: *Deleted

## 2015-06-21 NOTE — Telephone Encounter (Signed)
Patient aware letter will be sent in the mail with instructions for her.

## 2015-06-21 NOTE — Telephone Encounter (Signed)
Patient to have urodynamic studies on Wednesday 07/05/2015 at 1100. RN called patient to review Urodynamics testing and appointment to have UA prior to urodynamics. Patient aware of appointment on Monday 07/03/15 at 0840 to have UA. Pt aware she needs to come with comfortably full bladder. Reviewed urodynamics procedure, as well as post procedure instructions with patient. Patient verbalized understanding.

## 2015-06-22 ENCOUNTER — Telehealth: Payer: Self-pay | Admitting: *Deleted

## 2015-06-22 NOTE — Telephone Encounter (Signed)
Called patient to schedule one week follow up after urodynamics study. Patient exercising and will call back to schedule at a more convenient time.

## 2015-06-23 NOTE — Telephone Encounter (Signed)
See next phone encounter for urodynamic scheduling information.  Routing to provider for final review. Patient agreeable to disposition. Will close encounter.

## 2015-06-27 NOTE — Telephone Encounter (Signed)
RN called to scheduled one week office visit with Dr. Quincy Simmonds following urodynamics. Patient stating that she will be out of town the week of July 4th andis travelling a lot this summer, so she was thinking she wanted to postpone surgery. Patient asking should she still have urodynamics done if she  Is going to postpone surgery. RN spoke with nursing supervisor who stated to cancel urodynamics testing and have patient call office to reschedule when she was ready to proceed. RN gave this information to the patient and she stated she would call the office to reschedule when she knew she would be back in town for a while.

## 2015-07-03 ENCOUNTER — Other Ambulatory Visit: Payer: Medicare Other

## 2015-07-05 ENCOUNTER — Ambulatory Visit: Payer: Medicare Other

## 2015-07-06 ENCOUNTER — Encounter (HOSPITAL_COMMUNITY): Payer: Medicare Other

## 2015-07-25 ENCOUNTER — Encounter: Payer: Self-pay | Admitting: Gastroenterology

## 2015-07-25 DIAGNOSIS — R809 Proteinuria, unspecified: Secondary | ICD-10-CM | POA: Insufficient documentation

## 2015-07-25 DIAGNOSIS — I129 Hypertensive chronic kidney disease with stage 1 through stage 4 chronic kidney disease, or unspecified chronic kidney disease: Secondary | ICD-10-CM | POA: Insufficient documentation

## 2015-08-10 ENCOUNTER — Encounter: Payer: Self-pay | Admitting: Gastroenterology

## 2015-08-10 ENCOUNTER — Ambulatory Visit (AMBULATORY_SURGERY_CENTER): Payer: Self-pay

## 2015-08-10 VITALS — Ht 62.0 in | Wt 132.0 lb

## 2015-08-10 DIAGNOSIS — J3089 Other allergic rhinitis: Secondary | ICD-10-CM

## 2015-08-10 DIAGNOSIS — Z1211 Encounter for screening for malignant neoplasm of colon: Secondary | ICD-10-CM

## 2015-08-10 MED ORDER — SUPREP BOWEL PREP KIT 17.5-3.13-1.6 GM/177ML PO SOLN: 1.0000 | Freq: Once | ORAL | 0 refills | Status: AC

## 2015-08-10 NOTE — Progress Notes (Signed)
No allergies to eggs or soy No past problems with anesthesia No home oxygen No diet meds  Declined emmi  Pt advised can have lt breakfast on prep day before 9am

## 2015-08-15 ENCOUNTER — Ambulatory Visit (AMBULATORY_SURGERY_CENTER): Payer: Medicare Other | Admitting: Gastroenterology

## 2015-08-15 ENCOUNTER — Encounter: Payer: Self-pay | Admitting: Gastroenterology

## 2015-08-15 VITALS — BP 162/73 | HR 51 | Temp 98.2°F | Resp 16 | Ht 62.0 in | Wt 132.0 lb

## 2015-08-15 DIAGNOSIS — Z1211 Encounter for screening for malignant neoplasm of colon: Secondary | ICD-10-CM | POA: Diagnosis not present

## 2015-08-15 MED ORDER — SODIUM CHLORIDE 0.9 % IV SOLN: 500.0000 mL | INTRAVENOUS | Status: DC

## 2015-08-15 NOTE — Op Note (Signed)
[place] Patient Name: Kelsey Mueller Procedure Date: 08/15/2015 1:35 PM MRN: [id number] Endoscopist: Remo Lipps P. Havery Moros , MD Age: 70 Referring MD:  Date of Birth: 1945-12-27 Gender: Female Account #: [id number] Procedure:                Colonoscopy Indications:              Screening for malignant neoplasm in the colon Medicines:                Monitored Anesthesia Care Procedure:                Pre-Anesthesia Assessment:                           - Prior to the procedure, a History and Physical                            was performed, and patient medications and                            allergies were reviewed. The patient's tolerance of                            previous anesthesia was also reviewed. The risks                            and benefits of the procedure and the sedation                            options and risks were discussed with the patient.                            All questions were answered, and informed consent                            was obtained. Prior Anticoagulants: The patient has                            taken no previous anticoagulant or antiplatelet                            agents. ASA Grade Assessment: II - A patient with                            mild systemic disease. After reviewing the risks                            and benefits, the patient was deemed in                            satisfactory condition to undergo the procedure.                           After obtaining informed consent, the colonoscope  was passed under direct vision. Throughout the                            procedure, the patient's blood pressure, pulse, and                            oxygen saturations were monitored continuously. The                            Model PCF-H190L [id number] scope was introduced                            through the anus and advanced to the the cecum,                            identified  by appendiceal orifice and ileocecal                            valve. The colonoscopy was technically difficult                            and complex due to a tortuous colon. The patient                            tolerated the procedure well. The quality of the                            bowel preparation was adequate. The ileocecal                            valve, appendiceal orifice, and rectum were                            photographed. Scope In: 1:37:12 PM Scope Out: 1:56:57 PM Scope Withdrawal Time: 0 hours 13 minutes 41 seconds  Total Procedure Duration: 0 hours 19 minutes 45 seconds  Findings:                 The perianal and digital rectal examinations were                            normal.                           Multiple medium-mouthed diverticula were found in                            the left colon and right colon.                           The colon was tortuous.                           The exam was otherwise without abnormality on  direct and retroflexion views. Complications:            No immediate complications. Estimated blood loss:                            None. Estimated Blood Loss:     Estimated blood loss: none. Impression:               - Diverticulosis in the left colon.                           - Tortuous colon.                           - The examination was otherwise normal on direct                            and retroflexion views. No polyps                           - Recommendation:           - Patient has a contact number available for                            emergencies. The signs and symptoms of potential                            delayed complications were discussed with the                            patient. Return to normal activities tomorrow.                            Written discharge instructions were provided to the                            patient.                           - Resume previous  diet.                           - Continue present medications.                           - No further colon cancer screening is warranted                            due to age                           - No repeat colonoscopy due to age. Remo Lipps P. [name], MD 08/15/2015 2:01:11 PM This report has been signed electronically.

## 2015-08-15 NOTE — Patient Instructions (Signed)
Discharge instructions given. Handout on diverticulosis. Resume previous medications. YOU HAD AN ENDOSCOPIC PROCEDURE TODAY AT THE [facility]:   Refer to the procedure report that was given to you for any specific questions about what was found during the examination.  If the procedure report does not answer your questions, please call your gastroenterologist to clarify.  If you requested that your care partner not be given the details of your procedure findings, then the procedure report has been included in a sealed envelope for you to review at your convenience later.  YOU SHOULD EXPECT: Some feelings of bloating in the abdomen. Passage of more gas than usual.  Walking can help get rid of the air that was put into your GI tract during the procedure and reduce the bloating. If you had a lower endoscopy (such as a colonoscopy or flexible sigmoidoscopy) you may notice spotting of blood in your stool or on the toilet paper. If you underwent a bowel prep for your procedure, you may not have a normal bowel movement for a few days.  Please Note:  You might notice some irritation and congestion in your nose or some drainage.  This is from the oxygen used during your procedure.  There is no need for concern and it should clear up in a day or so.  SYMPTOMS TO REPORT IMMEDIATELY:   Following lower endoscopy (colonoscopy or flexible sigmoidoscopy):  Excessive amounts of blood in the stool  Significant tenderness or worsening of abdominal pains  Swelling of the abdomen that is new, acute  Fever of 100F or higher  For urgent or emergent issues, a gastroenterologist can be reached at any hour by calling [phone].   DIET: Your first meal following the procedure should be a small meal and then it is ok to progress to your normal diet. Heavy or fried foods are harder to digest and may make you feel nauseous or bloated.  Likewise, meals heavy in dairy and vegetables can increase bloating.   Drink plenty of fluids but you should avoid alcoholic beverages for 24 hours.  ACTIVITY:  You should plan to take it easy for the rest of today and you should NOT DRIVE or use heavy machinery until tomorrow (because of the sedation medicines used during the test).    FOLLOW UP: Our staff will call the number listed on your records the next business day following your procedure to check on you and address any questions or concerns that you may have regarding the information given to you following your procedure. If we do not reach you, we will leave a message.  However, if you are feeling well and you are not experiencing any problems, there is no need to return our call.  We will assume that you have returned to your regular daily activities without incident.  If any biopsies were taken you will be contacted by phone or by letter within the next 1-3 weeks.  Please call us at [phone] if you have not heard about the biopsies in 3 weeks.    SIGNATURES/CONFIDENTIALITY: You and/or your care partner have signed paperwork which will be entered into your electronic medical record.  These signatures attest to the fact that that the information above on your After Visit Summary has been reviewed and is understood.  Full responsibility of the confidentiality of this discharge information lies with you and/or your care-partner. 

## 2015-08-15 NOTE — Progress Notes (Signed)
To recovery, report to [place], RN, VSS

## 2015-08-16 ENCOUNTER — Telehealth: Payer: Self-pay | Admitting: *Deleted

## 2015-08-16 NOTE — Telephone Encounter (Signed)
  Follow up Call-  Call back number 08/15/2015  Post procedure Call Back phone  # [phone]  Permission to leave phone message Yes  Some recent data might be hidden     Patient questions:  Do you have a fever, pain , or abdominal swelling? No. Pain Score  0 *  Have you tolerated food without any problems? Yes.    Have you been able to return to your normal activities? Yes.    Do you have any questions about your discharge instructions: Diet   No. Medications  No. Follow up visit  No.  Do you have questions or concerns about your Care? No.  Actions: * If pain score is 4 or above: No action needed, pain <4.  Information provided via spouse.

## 2015-09-04 ENCOUNTER — Other Ambulatory Visit: Payer: Self-pay | Admitting: Obstetrics & Gynecology

## 2015-09-04 DIAGNOSIS — Z1231 Encounter for screening mammogram for malignant neoplasm of breast: Secondary | ICD-10-CM

## 2015-09-07 ENCOUNTER — Encounter: Payer: Medicare Other | Admitting: Gastroenterology

## 2015-09-28 ENCOUNTER — Other Ambulatory Visit: Payer: Self-pay | Admitting: Internal Medicine

## 2015-09-28 DIAGNOSIS — R1084 Generalized abdominal pain: Secondary | ICD-10-CM

## 2015-10-10 ENCOUNTER — Ambulatory Visit
Admission: RE | Admit: 2015-10-10 | Discharge: 2015-10-10 | Disposition: A | Discharge status: Home / Self Care | Payer: Medicare Other | Source: Ambulatory Visit | Attending: Obstetrics & Gynecology | Admitting: Obstetrics & Gynecology

## 2015-10-10 DIAGNOSIS — Z1231 Encounter for screening mammogram for malignant neoplasm of breast: Secondary | ICD-10-CM

## 2015-10-12 ENCOUNTER — Ambulatory Visit
Admission: RE | Admit: 2015-10-12 | Discharge: 2015-10-12 | Disposition: A | Discharge status: Home / Self Care | Payer: Medicare Other | Source: Ambulatory Visit | Attending: Internal Medicine | Admitting: Internal Medicine

## 2015-10-12 DIAGNOSIS — R1084 Generalized abdominal pain: Secondary | ICD-10-CM

## 2015-10-12 MED ORDER — IOPAMIDOL (ISOVUE-300) INJECTION 61%
100.0000 mL | Freq: Once | INTRAVENOUS | Status: AC | PRN
  Administered 2015-10-12: 100 mL via INTRAVENOUS

## 2015-10-17 ENCOUNTER — Other Ambulatory Visit (INDEPENDENT_AMBULATORY_CARE_PROVIDER_SITE_OTHER): Payer: Medicare Other

## 2015-10-17 ENCOUNTER — Ambulatory Visit (INDEPENDENT_AMBULATORY_CARE_PROVIDER_SITE_OTHER): Payer: Medicare Other | Admitting: Physician Assistant

## 2015-10-17 ENCOUNTER — Encounter: Payer: Self-pay | Admitting: Physician Assistant

## 2015-10-17 VITALS — BP 130/70 | HR 82 | Ht 62.0 in | Wt 132.5 lb

## 2015-10-17 DIAGNOSIS — K30 Functional dyspepsia: Secondary | ICD-10-CM

## 2015-10-17 DIAGNOSIS — K219 Gastro-esophageal reflux disease without esophagitis: Secondary | ICD-10-CM

## 2015-10-17 DIAGNOSIS — R141 Gas pain: Secondary | ICD-10-CM

## 2015-10-17 DIAGNOSIS — R143 Flatulence: Secondary | ICD-10-CM

## 2015-10-17 DIAGNOSIS — R142 Eructation: Principal | ICD-10-CM

## 2015-10-17 LAB — CBC WITH DIFFERENTIAL/PLATELET
BASOS ABS: 0 10*3/uL (ref 0.0–0.1)
Basophils Relative: 0.7 % (ref 0.0–3.0)
Eosinophils Absolute: 0.2 10*3/uL (ref 0.0–0.7)
Eosinophils Relative: 3.7 % (ref 0.0–5.0)
HCT: 36.7 % (ref 36.0–46.0)
Hemoglobin: 12.4 g/dL (ref 12.0–15.0)
LYMPHS ABS: 1.8 10*3/uL (ref 0.7–4.0)
Lymphocytes Relative: 29.2 % (ref 12.0–46.0)
MCHC: 33.9 g/dL (ref 30.0–36.0)
MCV: 97.3 fl (ref 78.0–100.0)
MONO ABS: 0.4 10*3/uL (ref 0.1–1.0)
Monocytes Relative: 6.2 % (ref 3.0–12.0)
NEUTROS ABS: 3.7 10*3/uL (ref 1.4–7.7)
NEUTROS PCT: 60.2 % (ref 43.0–77.0)
PLATELETS: 285 10*3/uL (ref 150.0–400.0)
RBC: 3.77 Mil/uL — AB (ref 3.87–5.11)
RDW: 13.7 % (ref 11.5–15.5)
WBC: 6.2 10*3/uL (ref 4.0–10.5)

## 2015-10-17 LAB — COMPREHENSIVE METABOLIC PANEL
ALK PHOS: 80 U/L (ref 39–117)
ALT: 14 U/L (ref 0–35)
AST: 20 U/L (ref 0–37)
Albumin: 3.8 g/dL (ref 3.5–5.2)
BILIRUBIN TOTAL: 0.3 mg/dL (ref 0.2–1.2)
BUN: 24 mg/dL — AB (ref 6–23)
CO2: 29 meq/L (ref 19–32)
Calcium: 9.4 mg/dL (ref 8.4–10.5)
Chloride: 107 mEq/L (ref 96–112)
Creatinine, Ser: 0.77 mg/dL (ref 0.40–1.20)
GFR: 78.64 mL/min (ref >60.00)
GLUCOSE: 91 mg/dL (ref 70–99)
POTASSIUM: 4.2 meq/L (ref 3.5–5.1)
SODIUM: 142 meq/L (ref 135–145)
TOTAL PROTEIN: 6.6 g/dL (ref 6.0–8.3)

## 2015-10-17 MED ORDER — PANTOPRAZOLE SODIUM 40 MG PO TBEC: 40.0000 mg | DELAYED_RELEASE_TABLET | Freq: Every day | ORAL | 6 refills | Status: DC

## 2015-10-17 NOTE — Patient Instructions (Addendum)
Your physician has requested that you go to the basement for the following lab work before leaving today: CBC, CMET, H Pylori  We have sent the following medications to your pharmacy for you to pick up at your convenience: Protonix 40 mg daily 30 mins before dinner  Continue Dicyclomine as needed.   We have given you a handout on diet control for gas.

## 2015-10-17 NOTE — Progress Notes (Signed)
Agree with assessment and plan as outlined.  

## 2015-10-17 NOTE — Progress Notes (Signed)
Subjective:    Patient ID: Kelsey Mueller, female    DOB: [birth year], 70 y.o.   MRN: [id number]  HPI Kelsey Mueller is a pleasant 70 year old white female recently known to Dr. Havery Moros. She is referred today by Dr. Brigitte Pulse for evaluation of abdominal discomfort which is been present over the past 4-6 months. She had undergone colonoscopy on 08/15/2015 for surveillance and was found to have a tortuous colon and multiple medium-size diverticuli of the right and left colon, no polyps. Patient does have history of breast cancer, chronic GERD for which she underwent a Nissen fundoplication 99991111 years ago, osteoarthritis,and  Hypertension. She states that after she had her Nissen fundoplication she did not have any problems with indigestion for many years. She feels that over the past few months some of these similar type symptoms have come back. She says frequently in the evenings around bedtime she's having problems with abdominal gassiness bloating belching. She has been given a prescription for dicyclomine and says generally when she is uncomfortable she gets up from bed and takes a Pepcid complete and a dicyclomine and her symptoms will eventually improve. She is not having any problems with nausea, her appetite has been good. She has been on Weight Watchers and is down about 30 pounds intentionally. She says she wonders if some of her dietary changes are contributing to gas and indigestion as she's been eating a lot more greens like kale and lettuce. She denies any dysphagia, she's having occasional heartburn but not on a regular basis. Denies any sour brash. No regular aspirin or NSAIDs. She did undergo CT of the abdomen and pelvis on 10/12/2015 which was done for the upper abdominal discomfort,this was negative with the exception of cholelithiasis.   Review of Systems Pertinent positive and negative review of systems were noted in the above HPI section.  All other review of systems was otherwise  negative.  Outpatient Encounter Prescriptions as of 10/17/2015  Medication Sig  . acetaminophen (TYLENOL) 325 MG tablet Take 650 mg by mouth every 6 (six) hours as needed for mild pain or headache. Reported on 05/09/2015  . amoxicillin (AMOXIL) 500 MG tablet Take 500 mg by mouth once as needed (4 tabs before dental work).  . bifidobacterium infantis (ALIGN) capsule Take 1 capsule by mouth daily.  . cholecalciferol (VITAMIN D) 1000 UNITS tablet Take 2,000 Units by mouth daily.  Marland Kitchen denosumab (PROLIA) 60 MG/ML SOLN injection Inject 60 mg into the skin every 6 (six) months. Administer in upper arm, thigh, or abdomen  . dicyclomine (BENTYL) 10 MG capsule Take 10 mg by mouth at bedtime as needed for spasms.  . famotidine-calcium carbonate-magnesium hydroxide (PEPCID COMPLETE) 10-800-165 MG CHEW chewable tablet Chew 1 tablet by mouth daily as needed.  . fexofenadine (ALLEGRA) 180 MG tablet Take 180 mg by mouth daily as needed for allergies.   . hydrochlorothiazide (,MICROZIDE/HYDRODIURIL,) 12.5 MG capsule Take 12.5 mg by mouth every morning.   . lactobacillus acidophilus (BACID) TABS tablet Take 1 tablet by mouth daily.  Marland Kitchen loratadine (CLARITIN) 10 MG tablet Take 10 mg by mouth daily.  . Multiple Vitamin (MULTIVITAMIN) tablet Take 1 tablet by mouth daily.  . naproxen (NAPROSYN) 500 MG tablet Take 500 mg by mouth as needed.  . Psyllium (METAMUCIL FIBER PO) Take by mouth daily.  . vitamin C (ASCORBIC ACID) 500 MG tablet Take 500 mg by mouth daily.  Marland Kitchen zolpidem (AMBIEN) 5 MG tablet Take 5 mg by mouth at bedtime as needed for sleep.   Marland Kitchen  pantoprazole (PROTONIX) 40 MG tablet Take 1 tablet (40 mg total) by mouth daily.   Facility-Administered Encounter Medications as of 10/17/2015  Medication  . 0.9 %  sodium chloride infusion   No Known Allergies Patient Active Problem List   Diagnosis Date Noted  . Environmental and seasonal allergies   . Family history of breast cancer   . Family history of ovarian  cancer   . Genetic testing 08/19/2014  . OA (osteoarthritis) of knee 11/15/2013  . Cancer of right breast ([place]) 12/14/2012  . Breast cancer ([place]) 03/17/2012  . ALLERGIC RHINITIS 09/18/2008  . PALPITATIONS 08/30/2008  . CARDIAC MURMUR 08/30/2008  . DYSPNEA 08/30/2008  . ABNORMAL EKG 08/30/2008  . DEPRESSION 08/24/2008  . GERD 08/24/2008  . OSTEOPETROSIS 08/24/2008  . HYPERTENSION, HX OF 08/24/2008   Social History   Social History  . Marital status: Married    Spouse name: N/A  . Number of children: 3  . Years of education: N/A   Occupational History  . retired    Social History Main Topics  . Smoking status: Never Smoker  . Smokeless tobacco: Never Used  . Alcohol use 0.0 oz/week     Comment: rare  . Drug use: No  . Sexual activity: Yes    Partners: Male    Birth control/ protection: Post-menopausal   Other Topics Concern  . Not on file   Social History Narrative   Married. Retired Pharmacist, hospital Therapist, art)     Ms. Ohaver's family history includes Breast cancer in her cousin, paternal aunt, and paternal aunt; Cancer in her maternal uncle; Ovarian cancer in her maternal grandmother.      Objective:    Vitals:   10/17/15 1422  BP: 130/70  Pulse: 82    Physical Exam well-developed older white female in no acute distress, pleasant blood pressure 130/70 pulse 82 height 5 foot 2 weight 132 BMI 24.3. HEENT ;nontraumatic normocephalic EOMI PERRLA sclera anicteric, Cardiovascular ;regular rate and rhythm with S1-S2 no murmur or gallop, pulmonary clear bilaterally, Abdomen ;soft nontender nondistended bowel sounds are active there is no palpable mass or hepatosplenomegaly, Rectal ;exam not done, Neuropsych; mood and affect appropriate       Assessment & Plan:   #35 70 year old white female with 4-6 month history of frequent nighttime indigestion gas and bloating. Patient has history of severe GERD for which she underwent Nissen fundoplication 99991111 years ago,  certainly she may have developed loosening of her wrap and recurrent GERD though most of her symptoms are more bloating and gas related. #2 cholelithiasis-current symptoms not typical for biliary colic #3 diverticulosis #4 history of breast cancer  Plan; check CBC ,CMET, H. pylori antibody Start low gas diet and decrease carbonated beverages, she has been drinking Diet Coke We'll start a trial of Protonix 40 mg by mouth before meals dinner for 4-6 weeks and then reassess. We discussed EGD if she does not improve with the above measures. She will continue dicyclomine on a when necessary basis. We will schedule follow-up appointment with Dr. Havery Moros in 4-6 weeks, patient is advised to call should she have any progression of her symptoms in the interim     Alfredia Ferguson PA-C 10/17/2015   Cc: Marton Redwood, MD

## 2015-10-18 LAB — H. PYLORI ANTIBODY, IGG: H PYLORI IGG: NEGATIVE

## 2015-11-07 ENCOUNTER — Telehealth: Payer: Self-pay | Admitting: Obstetrics and Gynecology

## 2015-11-07 NOTE — Telephone Encounter (Signed)
Patient wants to schedule an appointment for Prolia.

## 2015-11-08 NOTE — Telephone Encounter (Addendum)
Spoke with patient. Patient calling to schedule Prolia injection. Patient had BMD 05/16/15 with recommendations to follow-up with Dr. Brigitte Pulse to see when she would need to stop the prolia injections. Patient states Dr. Brigitte Pulse recommended taking Prolia injections for 2 more years. Advised patient I would need to follow up with Dr. Talbert Nan for recommendations prior to scheduling and return call. Patient is agreeable.    Dr. Talbert Nan, please advise?

## 2015-11-08 NOTE — Telephone Encounter (Signed)
Agree, thank you

## 2015-11-08 NOTE — Telephone Encounter (Signed)
Spoke with patient after reviewing with Kandace Blitz, RN for recommendations. Advised patient to follow up with Dr. Brigitte Pulse for written communication that is ok to restart prolia since was stopped by Dr. Brigitte Pulse. Patient to discuss recommendations at next AEX with Dr. Talbert Nan 12/06/15 at 0830. Patient verbalizes understanding and is agreeable. Advised patient to return call to office for any further questions/concerns.   Dr. Talbert Nan, do you agree with recommendations.

## 2015-12-06 ENCOUNTER — Encounter: Payer: Self-pay | Admitting: Obstetrics and Gynecology

## 2015-12-06 ENCOUNTER — Ambulatory Visit (INDEPENDENT_AMBULATORY_CARE_PROVIDER_SITE_OTHER): Payer: Medicare Other | Admitting: Obstetrics and Gynecology

## 2015-12-06 VITALS — BP 118/78 | HR 72 | Resp 16 | Wt 129.0 lb

## 2015-12-06 DIAGNOSIS — Z01419 Encounter for gynecological examination (general) (routine) without abnormal findings: Secondary | ICD-10-CM | POA: Diagnosis not present

## 2015-12-06 DIAGNOSIS — N3946 Mixed incontinence: Secondary | ICD-10-CM | POA: Diagnosis not present

## 2015-12-06 DIAGNOSIS — R140 Abdominal distension (gaseous): Secondary | ICD-10-CM | POA: Diagnosis not present

## 2015-12-06 DIAGNOSIS — M8589 Other specified disorders of bone density and structure, multiple sites: Secondary | ICD-10-CM

## 2015-12-06 DIAGNOSIS — Z124 Encounter for screening for malignant neoplasm of cervix: Secondary | ICD-10-CM | POA: Diagnosis not present

## 2015-12-06 NOTE — Patient Instructions (Signed)

## 2015-12-06 NOTE — Progress Notes (Addendum)
70 y.o. G3P3 MarriedCaucasianF here for annual exam.  H/O right breast cancer, s/p lumpectomy and radiation. Negative genetic testing.  H/O mixed incontinence, stress>urge, tolerable.  She c/o a couple of month h/o abdominal bloating. It has been going on for a couple of months. Primary thinks it's from her IBS. Worse at night, "stomach ache", mostly lower abdomen. Increase in flatus and burping and passing gas. Bentyl helps. Pepcid also helps. Just had a colonoscopy.  She has been on weight watchers.  Grandmother with either stomach or ovarian cancer.  Sexually active, no pain. No vaginal bleeding.  Her DEXA improved, we had discussed going off of the prolia. Her primary wanted her to stay on secondary to a h/o a femur fracture. She fell over a mat in the gym.     Patient's last menstrual period was 01/07/1997.          Sexually active: Yes.    The current method of family planning is post menopausal status.    Exercising: Yes.    walking Smoker:  no  Health Maintenance: Pap:  09-28-12 WNL  History of abnormal Pap:  no MMG:  10-12-15 WNL  Colonoscopy:  08-15-15 WNL  BMD:   09-16-15 Osteopenia  TDaP:  Up to date PCP  Gardasil: N/A   reports that she has never smoked. She has never used smokeless tobacco. She reports that she does not drink alcohol or use drugs. 3 kids, 4 grandchildren. All her kids live here. 2 of her granddaughters are adopted from [place].  Past Medical History:  Diagnosis Date  . Allergic rhinoconjunctivitis   . Anemia    hx of  . Arthritis   . Asthma    very mild  . Breast cancer [facility])    s/p XRT and lumpectomy 2009  . Depression   . Environmental allergies   . Environmental and seasonal allergies   . Family history of breast cancer   . Family history of ovarian cancer   . GERD (gastroesophageal reflux disease)   . Heart murmur    slight  . Hypertension   . Infertility    took clomid with second pregnancy  . Knee injury 06/13/12   fell, cracked femur  under knee cap-right  . Left-sided weakness    left arm due to polio  . Migraine    hx of menstrual  . Obesity   . Osteoporosis   . Pneumonia    hx of  . Polio    age 26 month old-"slight case"    Past Surgical History:  Procedure Laterality Date  . BREAST BIOPSY     right breast ER/PR +  . BREAST LUMPECTOMY  2009   R. Eston Esters   . HIATAL HERNIA REPAIR  2005   with Nissen   . NISSEN FUNDOPLICATION  [id number]  . TONSILLECTOMY  70 years old  . TOTAL KNEE ARTHROPLASTY Right 11/15/2013   Procedure: RIGHT TOTAL KNEE ARTHROPLASTY;  Surgeon: Gearlean Alf, MD;  Location: [facility] ORS;  Service: Orthopedics;  Laterality: Right;  . TUBAL LIGATION  1976    Current Outpatient Prescriptions  Medication Sig Dispense Refill  . acetaminophen (TYLENOL) 325 MG tablet Take 650 mg by mouth every 6 (six) hours as needed for mild pain or headache. Reported on 05/09/2015    . amoxicillin (AMOXIL) 500 MG tablet Take 500 mg by mouth once as needed (4 tabs before dental work).    . bifidobacterium infantis (ALIGN) capsule Take 1 capsule by mouth daily.    Marland Kitchen  cholecalciferol (VITAMIN D) 1000 UNITS tablet Take 2,000 Units by mouth daily.    Marland Kitchen denosumab (PROLIA) 60 MG/ML SOLN injection Inject 60 mg into the skin every 6 (six) months. Administer in upper arm, thigh, or abdomen    . dicyclomine (BENTYL) 10 MG capsule Take 10 mg by mouth at bedtime as needed for spasms.    . DULoxetine (CYMBALTA) 30 MG capsule     . famotidine-calcium carbonate-magnesium hydroxide (PEPCID COMPLETE) 10-800-165 MG CHEW chewable tablet Chew 1 tablet by mouth daily as needed.    . fexofenadine (ALLEGRA) 180 MG tablet Take 180 mg by mouth daily as needed for allergies.     . hydrochlorothiazide (,MICROZIDE/HYDRODIURIL,) 12.5 MG capsule Take 12.5 mg by mouth every morning.     . lactobacillus acidophilus (BACID) TABS tablet Take 1 tablet by mouth daily.    Marland Kitchen loratadine (CLARITIN) 10 MG tablet Take 10 mg by mouth daily.    . Multiple  Vitamin (MULTIVITAMIN) tablet Take 1 tablet by mouth daily.    . naproxen (NAPROSYN) 500 MG tablet Take 500 mg by mouth as needed.    . Psyllium (METAMUCIL FIBER PO) Take by mouth daily.    . vitamin C (ASCORBIC ACID) 500 MG tablet Take 500 mg by mouth daily.    Marland Kitchen zolpidem (AMBIEN) 5 MG tablet Take 5 mg by mouth at bedtime as needed for sleep.      Current Facility-Administered Medications  Medication Dose Route Frequency Provider Last Rate Last Dose  . 0.9 %  sodium chloride infusion  500 mL Intravenous Continuous Manus Gunning, MD        Family History  Problem Relation Age of Onset  . Breast cancer Paternal Aunt     dx in her 46s, died in her 42s  . Cancer Maternal Uncle     NOS  . Ovarian cancer Maternal Grandmother   . Breast cancer Paternal Aunt     dx in her 75s; bilateral breast cancer; double mastectomy  . Breast cancer Cousin     bilateral breast cancer in her 40s  . Coronary artery disease Neg Hx     Review of Systems  Constitutional: Negative.   HENT: Negative.   Eyes: Negative.   Respiratory: Negative.   Cardiovascular: Negative.   Gastrointestinal: Negative.        Bloating   Endocrine: Negative.   Genitourinary: Negative.        Loss of sexual interest  Loss of urine with sneeze  Night urination   Musculoskeletal: Negative.   Skin: Negative.   Allergic/Immunologic: Negative.   Neurological: Negative.   Psychiatric/Behavioral: Negative.     Exam:   BP 118/78 (BP Location: Right Arm, Patient Position: Sitting, Cuff Size: Normal)   Pulse 72   Resp 16   Wt 129 lb (58.5 kg)   LMP 01/07/1997   BMI 23.59 kg/m   Weight change: @WEIGHTCHANGE @ Height:      Ht Readings from Last 3 Encounters:  10/17/15 5\' 2"  (1.575 m)  08/15/15 5\' 2"  (1.575 m)  08/10/15 5\' 2"  (1.575 m)    General appearance: alert, cooperative and appears stated age Head: Normocephalic, without obvious abnormality, atraumatic Neck: no adenopathy, supple, symmetrical, trachea  midline and thyroid normal to inspection and palpation Lungs: clear to auscultation bilaterally Breasts: normal appearance, no masses or tenderness Heart: regular rate and rhythm Abdomen: soft, non-tender; bowel sounds normal; no masses,  no organomegaly Extremities: extremities normal, atraumatic, no cyanosis or edema Skin: Skin color, texture,  turgor normal. No rashes or lesions Lymph nodes: Cervical, supraclavicular, and axillary nodes normal. No abnormal inguinal nodes palpated Neurologic: Grossly normal   Pelvic: External genitalia:  no lesions              Urethra:  normal appearing urethra with no masses, tenderness or lesions              Bartholins and Skenes: normal                 Vagina: normal appearing vagina with normal color and discharge, no lesions, grade 2 cystocele, grade 1-2 rectocele, no uterine prolapse              Cervix: only anterior lip of cervix seen, prolapse around it               Bimanual Exam:  Uterus:  normal size, contour, position, consistency, mobility, non-tender              Adnexa: no mass, fullness, tenderness               Rectovaginal: Confirms               Anus:  normal sphincter tone, no lesions  Chaperone was present for exam.  A:  Well Woman with normal exam  Osteopenia, improved on Prolia  Mixed incontinence, tolerable, a little better  Abdominal bloating and pain, recent normal CT, discussed dietary changes  P:   Will refer to Rheumatology for opinion on continuing on medications or not. H/O fragility fracture  Pap with hpv  Mammogram UTD  Colonoscopy UTD  Discussed breast self exam  Discussed calcium and vit D intake  Labs and immunizations with primary   02/22/16 Addendum: The patient was seen at [facility], the recommendation was to not re-start prolia at this time, but to repeat her DEXA in 5/19 and start therapy at that time if she has worsening in her bone density.

## 2015-12-08 NOTE — Addendum Note (Signed)
Addended by: Dorothy Spark on: 12/08/2015 10:28 AM   Modules accepted: Orders

## 2015-12-12 LAB — IPS PAP TEST WITH HPV

## 2015-12-15 ENCOUNTER — Telehealth: Payer: Self-pay | Admitting: Gastroenterology

## 2015-12-15 NOTE — Telephone Encounter (Signed)
I would advise her to keep her appointment so I can discuss risks / benefit of protonix as PPI class of medication has been in the news lately. To clarify for her, protonix does not deplete calcium levels, but it MAY increase the risk of hip fracture by a small amount. Long term we recommend the lowest dose needed to control symptoms or another class. I can talk to her more about this during clinic visit. thanks

## 2015-12-15 NOTE — Telephone Encounter (Signed)
Patient is doing very well since starting the Protonix. She is wondering if she needs to come back for a follow up? Also her pharmacist told her that the Protonix depletes her calcium levels. She wanted to know if she should be concerned. Patient is currently taking a calcium supplement and vit. D supplement. Please advise.

## 2015-12-15 NOTE — Telephone Encounter (Signed)
Patient advised and will keep her appointment on 12/19/15.

## 2015-12-19 ENCOUNTER — Encounter: Payer: Self-pay | Admitting: Gastroenterology

## 2015-12-19 ENCOUNTER — Ambulatory Visit (INDEPENDENT_AMBULATORY_CARE_PROVIDER_SITE_OTHER): Payer: Medicare Other | Admitting: Gastroenterology

## 2015-12-19 VITALS — BP 116/74 | HR 70 | Ht 60.5 in | Wt 130.4 lb

## 2015-12-19 DIAGNOSIS — R140 Abdominal distension (gaseous): Secondary | ICD-10-CM

## 2015-12-19 NOTE — Patient Instructions (Signed)
If you are age 70 or older, your body mass index should be between 23-30. Your Body mass index is 25.04 kg/m. If this is out of the aforementioned range listed, please consider follow up with your Primary Care Provider.  If you are age 1 or younger, your body mass index should be between 19-25. Your Body mass index is 25.04 kg/m. If this is out of the aformentioned range listed, please consider follow up with your Primary Care Provider.   You have been given a Low FodMap diet to follow to day.  Please decrease your Psyllium supplement.  Titrate down your protonix then discontinue it.  You may discontinue your probiotic if it is not helping.  You may use Citrucel fiber supplement.  Thank you.

## 2015-12-19 NOTE — Progress Notes (Signed)
HPI :  70 year old female here for follow-up visit.  She has a history of Nissen fundoplication [id number] years ago, symptoms at that time were pyrosis and regurgitation. Since her surgery symptoms have been for the most part are well-controlled.  She presented to our office to see Nicoletta Ba in October complaining of abdominal bloating and distention, with increased gas. She had basic labs obtained to include H. pylori serology which was negative. She was also given a trial of Protonix once a day and continued Bentyl as needed.   Since her last visit reports her symptoms are pretty minimal at this time. She has not any abdominal pain. She has some rare bloating but not significant. Her reflux symptoms remain well controlled. She denies dysphagia. She has been trying to lose weight with Weight Watchers over the past 6 months since the symptoms began. During this time she's been eating a lot more fruits and vegetables. She also takes Metamucil supplement daily.. Stable bowel habits, no blood in the stools. She is also taking Electronics engineer.    Colonoscopy 08/15/2015 - scattered diverticulosis, tortous colon, no polyps  Past Medical History:  Diagnosis Date  . Allergic rhinoconjunctivitis   . Anemia    hx of  . Arthritis   . Asthma    very mild  . Breast cancer [facility])    s/p XRT and lumpectomy 2009  . Depression   . Environmental allergies   . Environmental and seasonal allergies   . Family history of breast cancer   . Family history of ovarian cancer   . GERD (gastroesophageal reflux disease)   . Heart murmur    slight  . Hypertension   . Infertility    took clomid with second pregnancy  . Knee injury 06/13/12   fell, cracked femur under knee cap-right  . Left-sided weakness    left arm due to polio  . Migraine    hx of menstrual  . Obesity   . Osteoporosis   . Pneumonia    hx of  . Polio    age 6 month old-"slight case"     Past Surgical History:  Procedure Laterality Date  .  BREAST BIOPSY     right breast ER/PR +  . BREAST LUMPECTOMY  2009   R. Eston Esters   . HIATAL HERNIA REPAIR  2005   with Nissen   . NISSEN FUNDOPLICATION  [id number]  . TONSILLECTOMY  70 years old  . TOTAL KNEE ARTHROPLASTY Right 11/15/2013   Procedure: RIGHT TOTAL KNEE ARTHROPLASTY;  Surgeon: Gearlean Alf, MD;  Location: [facility];  Service: Orthopedics;  Laterality: Right;  . TUBAL LIGATION  1976   Family History  Problem Relation Age of Onset  . Breast cancer Paternal Aunt     dx in her 92s, died in her 48s  . Cancer Maternal Uncle     NOS  . Ovarian cancer Maternal Grandmother   . Breast cancer Paternal Aunt     dx in her 10s; bilateral breast cancer; double mastectomy  . Breast cancer Cousin     bilateral breast cancer in her 91s  . Coronary artery disease Neg Hx    Social History  Substance Use Topics  . Smoking status: Never Smoker  . Smokeless tobacco: Never Used  . Alcohol use No   Current Outpatient Prescriptions  Medication Sig Dispense Refill  . amoxicillin (AMOXIL) 500 MG tablet Take 500 mg by mouth once as needed (4 tabs before dental work).    Marland Kitchen  bifidobacterium infantis (ALIGN) capsule Take 1 capsule by mouth daily.    . cholecalciferol (VITAMIN D) 1000 UNITS tablet Take 2,000 Units by mouth daily.    Marland Kitchen dicyclomine (BENTYL) 10 MG capsule Take 10 mg by mouth at bedtime as needed for spasms.    . DULoxetine (CYMBALTA) 30 MG capsule     . fexofenadine (ALLEGRA) 180 MG tablet Take 180 mg by mouth daily as needed for allergies.     . hydrochlorothiazide (,MICROZIDE/HYDRODIURIL,) 12.5 MG capsule Take 12.5 mg by mouth every morning.     . loratadine (CLARITIN) 10 MG tablet Take 10 mg by mouth daily.    . Multiple Vitamin (MULTIVITAMIN) tablet Take 1 tablet by mouth daily.    . naproxen (NAPROSYN) 500 MG tablet Take 500 mg by mouth as needed.    . pantoprazole (PROTONIX) 40 MG tablet Take 40 mg by mouth daily.    . Psyllium (METAMUCIL FIBER PO) Take by mouth daily.    .  vitamin C (ASCORBIC ACID) 500 MG tablet Take 500 mg by mouth daily.    Marland Kitchen zolpidem (AMBIEN) 5 MG tablet Take 5 mg by mouth at bedtime as needed for sleep.      No current facility-administered medications for this visit.    No Known Allergies   Review of Systems: All systems reviewed and negative except where noted in HPI.    Lab Results  Component Value Date   WBC 6.2 10/17/2015   HGB 12.4 10/17/2015   HCT 36.7 10/17/2015   MCV 97.3 10/17/2015   PLT 285.0 10/17/2015    Lab Results  Component Value Date   CREATININE 0.77 10/17/2015   BUN 24 (H) 10/17/2015   NA 142 10/17/2015   K 4.2 10/17/2015   CL 107 10/17/2015   CO2 29 10/17/2015    Lab Results  Component Value Date   ALT 14 10/17/2015   AST 20 10/17/2015   ALKPHOS 80 10/17/2015   BILITOT 0.3 10/17/2015     Physical Exam: BP 116/74   Pulse 70   Ht 5' 0.5" (1.537 m) Comment: measured without shoes  Wt 130 lb 6 oz (59.1 kg)   LMP 01/07/1997   BMI 25.04 kg/m  Constitutional: Pleasant,well-developed, female in no acute distress. HEENT: Normocephalic and atraumatic. Conjunctivae are normal. No scleral icterus. Neck supple.  Cardiovascular: Normal rate, regular rhythm.  Pulmonary/chest: Effort normal and breath sounds normal. No wheezing, rales or rhonchi. Abdominal: Soft, nondistended, nontender. There are no masses palpable. No hepatomegaly. Extremities: no edema Lymphadenopathy: No cervical adenopathy noted. Neurological: Alert and oriented to person place and time. Skin: Skin is warm and dry. No rashes noted. Psychiatric: Normal mood and affect. Behavior is normal.   ASSESSMENT AND PLAN: 70 year old female here for reassessment of bloating.   Based on history provided today, these symptoms appear to correlate with her significant change in diet since going on weight watchers 6 months ago. She is eating much more fruits and vegetables than she had previously, and suspect this is likely related to her  symptoms. She had a CT scan in October which did not show any concerning intra-abdominal pathology to cause the symptoms. She does have gallstones noted but has no symptoms of biliary colic, I don't think gallstones are causing any symptoms at present. Moving forward recommend she try a low FODMAP diet which I suspect will help her symptoms. She is also using routine psyllium supplement which can cause bloating and recommend she stop this. If she wants to  use fiber supplement Citrucel may be better tolerated. While time course suggests Protonix may have helped her symptoms, her reflux symptoms in general have been very well controlled since her surgery. I think she can titrate off Protonix and eventually stop it as I'm not convinced this is controlling her bloating symptoms. She will switch to 20 mg once a day for a week, then every other day for a week and then stop. We discussed risks and benefits of long-term PPI use, if after she stops it her symptoms recur she feels worse she can resume this at the low-dose dose needed to control her symptoms or consider zantac. Otherwise if she does not think routine probiotic use is helping her symptoms she can also stop that as well. She can follow up PRN.   [place] Cellar, MD [facility] Pager [phone]

## 2016-02-22 ENCOUNTER — Ambulatory Visit: Payer: Self-pay | Admitting: Obstetrics & Gynecology

## 2016-08-30 ENCOUNTER — Other Ambulatory Visit: Payer: Self-pay | Admitting: Obstetrics and Gynecology

## 2016-08-30 DIAGNOSIS — Z1231 Encounter for screening mammogram for malignant neoplasm of breast: Secondary | ICD-10-CM

## 2016-10-11 ENCOUNTER — Ambulatory Visit: Payer: Self-pay | Admitting: Orthopedic Surgery

## 2016-10-14 ENCOUNTER — Ambulatory Visit
Admission: RE | Admit: 2016-10-14 | Discharge: 2016-10-14 | Disposition: A | Discharge status: Home / Self Care | Payer: Medicare Other | Source: Ambulatory Visit | Attending: Obstetrics and Gynecology | Admitting: Obstetrics and Gynecology

## 2016-10-14 DIAGNOSIS — Z1231 Encounter for screening mammogram for malignant neoplasm of breast: Secondary | ICD-10-CM

## 2016-10-14 HISTORY — DX: Personal history of irradiation: Z92.3

## 2016-10-25 ENCOUNTER — Other Ambulatory Visit (HOSPITAL_COMMUNITY): Payer: Self-pay | Admitting: Emergency Medicine

## 2016-10-25 NOTE — Patient Instructions (Signed)
Kelsey Mueller  [id number]   Your procedure is scheduled on: 11-04-16  Report to [facility] Main  Entrance    Report to admitting at 1:50PM   Call this number if you have problems the morning of surgery [phone]   Remember: ONLY 1 PERSON MAY GO WITH YOU TO SHORT STAY TO GET  READY MORNING OF YOUR SURGERY.  Do not eat food After Midnight. You may have clear liquids from midnight until 10:20am day of surgery. Nothing by mouth after 10:20am!     Take these medicines the morning of surgery with A SIP OF WATER: pantoprazole(protonix), allegra as needed                                You may not have any metal on your body including hair pins and              piercings  Do not wear jewelry, make-up, lotions, powders or perfumes, deodorant             Do not wear nail polish.  Do not shave  48 hours prior to surgery.                 Do not bring valuables to the hospital. [place].  Contacts, dentures or bridgework may not be worn into surgery.       Patients discharged the day of surgery will not be allowed to drive home.  Name and phone number of your driver:  Special Instructions: N/A              Please read over the following fact sheets you were given: _____________________________________________________________________    CLEAR LIQUID DIET   Foods Allowed                                                                     Foods Excluded  Coffee and tea, regular and decaf                             liquids that you cannot  Plain Jell-O in any flavor                                             see through such as: Fruit ices (not with fruit pulp)                                     milk, soups, orange juice  Iced Popsicles                                    All solid food Carbonated beverages, regular and diet  Cranberry, grape and apple juices Sports  drinks like Gatorade Lightly seasoned clear broth or consume(fat free) Sugar, honey syrup  Sample Menu Breakfast                                Lunch                                     Supper Cranberry juice                    Beef broth                            Chicken broth Jell-O                                     Grape juice                           Apple juice Coffee or tea                        Jell-O                                      Popsicle                                                Coffee or tea                        Coffee or tea  _____________________________________________________________________  [facility] - Preparing for Surgery Before surgery, you can play an important role.  Because skin is not sterile, your skin needs to be as free of germs as possible.  You can reduce the number of germs on your skin by washing with CHG (chlorahexidine gluconate) soap before surgery.  CHG is an antiseptic cleaner which kills germs and bonds with the skin to continue killing germs even after washing. Please DO NOT use if you have an allergy to CHG or antibacterial soaps.  If your skin becomes reddened/irritated stop using the CHG and inform your nurse when you arrive at Short Stay. Do not shave (including legs and underarms) for at least 48 hours prior to the first CHG shower.  You may shave your face/neck. Please follow these instructions carefully:  1.  Shower with CHG Soap the night before surgery and the  morning of Surgery.  2.  If you choose to wash your hair, wash your hair first as usual with your  normal  shampoo.  3.  After you shampoo, rinse your hair and body thoroughly to remove the  shampoo.                           4.  Use CHG as you would any other liquid soap.  You can apply chg directly  to the skin and wash  Gently with a scrungie or clean washcloth.  5.  Apply the CHG Soap to your body ONLY FROM THE NECK DOWN.   Do not use on face/  open                           Wound or open sores. Avoid contact with eyes, ears mouth and genitals (private parts).                       Wash face,  Genitals (private parts) with your normal soap.             6.  Wash thoroughly, paying special attention to the area where your surgery  will be performed.  7.  Thoroughly rinse your body with warm water from the neck down.  8.  DO NOT shower/wash with your normal soap after using and rinsing off  the CHG Soap.                9.  Pat yourself dry with a clean towel.            10.  Wear clean pajamas.            11.  Place clean sheets on your bed the night of your first shower and do not  sleep with pets. Day of Surgery : Do not apply any lotions/deodorants the morning of surgery.  Please wear clean clothes to the hospital/surgery center.  FAILURE TO FOLLOW THESE INSTRUCTIONS MAY RESULT IN THE CANCELLATION OF YOUR SURGERY PATIENT SIGNATURE_________________________________  NURSE SIGNATURE__________________________________  ________________________________________________________________________   Kelsey Mueller  An incentive spirometer is a tool that can help keep your lungs clear and active. This tool measures how well you are filling your lungs with each breath. Taking long deep breaths may help reverse or decrease the chance of developing breathing (pulmonary) problems (especially infection) following:  A long period of time when you are unable to move or be active. BEFORE THE PROCEDURE   If the spirometer includes an indicator to show your best effort, your nurse or respiratory therapist will set it to a desired goal.  If possible, sit up straight or lean slightly forward. Try not to slouch.  Hold the incentive spirometer in an upright position. INSTRUCTIONS FOR USE  1. Sit on the edge of your bed if possible, or sit up as far as you can in bed or on a chair. 2. Hold the incentive spirometer in an upright  position. 3. Breathe out normally. 4. Place the mouthpiece in your mouth and seal your lips tightly around it. 5. Breathe in slowly and as deeply as possible, raising the piston or the ball toward the top of the column. 6. Hold your breath for 3-5 seconds or for as long as possible. Allow the piston or ball to fall to the bottom of the column. 7. Remove the mouthpiece from your mouth and breathe out normally. 8. Rest for a few seconds and repeat Steps 1 through 7 at least 10 times every 1-2 hours when you are awake. Take your time and take a few normal breaths between deep breaths. 9. The spirometer may include an indicator to show your best effort. Use the indicator as a goal to work toward during each repetition. 10. After each set of 10 deep breaths, practice coughing to be sure your lungs are clear. If you have an incision (the cut made at the time of  surgery), support your incision when coughing by placing a pillow or rolled up towels firmly against it. Once you are able to get out of bed, walk around indoors and cough well. You may stop using the incentive spirometer when instructed by your caregiver.  RISKS AND COMPLICATIONS  Take your time so you do not get dizzy or light-headed.  If you are in pain, you may need to take or ask for pain medication before doing incentive spirometry. It is harder to take a deep breath if you are having pain. AFTER USE  Rest and breathe slowly and easily.  It can be helpful to keep track of a log of your progress. Your caregiver can provide you with a simple table to help with this. If you are using the spirometer at home, follow these instructions: [place] IF:   You are having difficultly using the spirometer.  You have trouble using the spirometer as often as instructed.  Your pain medication is not giving enough relief while using the spirometer.  You develop fever of 100.5 F (38.1 C) or higher. SEEK IMMEDIATE MEDICAL CARE IF:    You cough up bloody sputum that had not been present before.  You develop fever of 102 F (38.9 C) or greater.  You develop worsening pain at or near the incision site. MAKE SURE YOU:   Understand these instructions.  Will watch your condition.  Will get help right away if you are not doing well or get worse. Document Released: 05/06/2006 Document Revised: 03/18/2011 Document Reviewed: 07/07/2006 [facility] Patient Information 2014 [place], [place].   ________________________________________________________________________

## 2016-10-28 ENCOUNTER — Encounter (HOSPITAL_COMMUNITY): Payer: Self-pay

## 2016-10-28 ENCOUNTER — Encounter (HOSPITAL_COMMUNITY)
Admission: RE | Admit: 2016-10-28 | Discharge: 2016-10-28 | Disposition: A | Discharge status: Home / Self Care | Payer: Medicare Other | Source: Ambulatory Visit | Attending: Orthopedic Surgery | Admitting: Orthopedic Surgery

## 2016-10-28 DIAGNOSIS — M24661 Ankylosis, right knee: Secondary | ICD-10-CM | POA: Diagnosis not present

## 2016-10-28 DIAGNOSIS — Z01812 Encounter for preprocedural laboratory examination: Secondary | ICD-10-CM | POA: Insufficient documentation

## 2016-10-28 LAB — CBC
HCT: 36.6 % (ref 36.0–46.0)
Hemoglobin: 12.4 g/dL (ref 12.0–15.0)
MCH: 33.3 pg (ref 26.0–34.0)
MCHC: 33.9 g/dL (ref 30.0–36.0)
MCV: 98.4 fL (ref 78.0–100.0)
Platelets: 282 10*3/uL (ref 150–400)
RBC: 3.72 MIL/uL — ABNORMAL LOW (ref 3.87–5.11)
RDW: 13.1 % (ref 11.5–15.5)
WBC: 6.4 10*3/uL (ref 4.0–10.5)

## 2016-10-28 LAB — BASIC METABOLIC PANEL
ANION GAP: 9 (ref 5–15)
BUN: 19 mg/dL (ref 6–20)
CALCIUM: 9.6 mg/dL (ref 8.9–10.3)
CO2: 28 mmol/L (ref 22–32)
Chloride: 107 mmol/L (ref 101–111)
Creatinine, Ser: 0.79 mg/dL (ref 0.44–1.00)
GFR calc Af Amer: >60 mL/min (ref >60)
GFR calc non Af Amer: >60 mL/min (ref >60)
GLUCOSE: 91 mg/dL (ref 65–99)
Potassium: 4.2 mmol/L (ref 3.5–5.1)
Sodium: 144 mmol/L (ref 135–145)

## 2016-10-28 LAB — SURGICAL PCR SCREEN
MRSA, PCR: NEGATIVE
Staphylococcus aureus: NEGATIVE

## 2016-10-28 NOTE — Progress Notes (Signed)
Received call back from [facility] at Dr Brigitte Pulse office. Per Claiborne Billings, patient has no EKG on file within 12 months of surgery date. RN Macky Lower for prompt answer,. Patient will need EKG done day of surgery. RN will indicate this on front page sheet of patient chart for short stay to be aware .

## 2016-10-28 NOTE — Progress Notes (Signed)
Patient refuses EKG to be done at pre-op , believes she had one this year at her PCP office Dr Brigitte Pulse at [facility]. RN called to request EKG ; had to [facility] for Claiborne Billings Public librarian) requesting EKG. RN explained to patient if no EKG received, may need to be done day of surgery. Patient verbalized understanding.

## 2016-11-04 ENCOUNTER — Encounter (HOSPITAL_COMMUNITY): Payer: Self-pay | Admitting: *Deleted

## 2016-11-04 ENCOUNTER — Ambulatory Visit (HOSPITAL_COMMUNITY)
Admission: RE | Admit: 2016-11-04 | Discharge: 2016-11-04 | Disposition: A | Discharge status: Home / Self Care | Payer: Medicare Other | Source: Ambulatory Visit | Attending: Orthopedic Surgery | Admitting: Orthopedic Surgery

## 2016-11-04 ENCOUNTER — Encounter (HOSPITAL_COMMUNITY)
Admission: RE | Disposition: A | Discharge status: Home / Self Care | Payer: Self-pay | Source: Ambulatory Visit | Attending: Orthopedic Surgery

## 2016-11-04 ENCOUNTER — Ambulatory Visit (HOSPITAL_COMMUNITY): Payer: Medicare Other | Admitting: Anesthesiology

## 2016-11-04 DIAGNOSIS — Z923 Personal history of irradiation: Secondary | ICD-10-CM | POA: Diagnosis not present

## 2016-11-04 DIAGNOSIS — B91 Sequelae of poliomyelitis: Secondary | ICD-10-CM | POA: Diagnosis not present

## 2016-11-04 DIAGNOSIS — Z853 Personal history of malignant neoplasm of breast: Secondary | ICD-10-CM | POA: Insufficient documentation

## 2016-11-04 DIAGNOSIS — I10 Essential (primary) hypertension: Secondary | ICD-10-CM | POA: Insufficient documentation

## 2016-11-04 DIAGNOSIS — Z96651 Presence of right artificial knee joint: Secondary | ICD-10-CM | POA: Insufficient documentation

## 2016-11-04 DIAGNOSIS — Y838 Other surgical procedures as the cause of abnormal reaction of the patient, or of later complication, without mention of misadventure at the time of the procedure: Secondary | ICD-10-CM | POA: Insufficient documentation

## 2016-11-04 DIAGNOSIS — J302 Other seasonal allergic rhinitis: Secondary | ICD-10-CM | POA: Diagnosis not present

## 2016-11-04 DIAGNOSIS — R531 Weakness: Secondary | ICD-10-CM | POA: Diagnosis not present

## 2016-11-04 DIAGNOSIS — M25561 Pain in right knee: Secondary | ICD-10-CM | POA: Diagnosis present

## 2016-11-04 DIAGNOSIS — Z79899 Other long term (current) drug therapy: Secondary | ICD-10-CM | POA: Insufficient documentation

## 2016-11-04 DIAGNOSIS — K219 Gastro-esophageal reflux disease without esophagitis: Secondary | ICD-10-CM | POA: Insufficient documentation

## 2016-11-04 DIAGNOSIS — T8482XA Fibrosis due to internal orthopedic prosthetic devices, implants and grafts, initial encounter: Secondary | ICD-10-CM | POA: Insufficient documentation

## 2016-11-04 HISTORY — PX: KNEE CLOSED REDUCTION: SHX995

## 2016-11-04 SURGERY — MANIPULATION, KNEE, CLOSED
Anesthesia: General | Site: Knee | Laterality: Right

## 2016-11-04 MED ORDER — PROPOFOL 10 MG/ML IV BOLUS
INTRAVENOUS | Status: DC | PRN
  Administered 2016-11-04: 100 mg via INTRAVENOUS

## 2016-11-04 MED ORDER — ACETAMINOPHEN 10 MG/ML IV SOLN
1000.0000 mg | Freq: Once | INTRAVENOUS | Status: AC
  Administered 2016-11-04: 1000 mg via INTRAVENOUS

## 2016-11-04 MED ORDER — LACTATED RINGERS IV SOLN
INTRAVENOUS | Status: DC
  Administered 2016-11-04: 15:00:00 via INTRAVENOUS

## 2016-11-04 MED ORDER — HYDROCODONE-ACETAMINOPHEN 5-325 MG PO TABS: 1.0000 | ORAL_TABLET | ORAL | 0 refills | Status: DC | PRN

## 2016-11-04 MED ORDER — FENTANYL CITRATE (PF) 100 MCG/2ML IJ SOLN: 25.0000 ug | INTRAMUSCULAR | Status: DC | PRN

## 2016-11-04 MED ORDER — LIDOCAINE 2% (20 MG/ML) 5 ML SYRINGE
INTRAMUSCULAR | Status: AC
  Filled 2016-11-04: qty 5

## 2016-11-04 MED ORDER — FENTANYL CITRATE (PF) 100 MCG/2ML IJ SOLN
INTRAMUSCULAR | Status: DC | PRN
  Administered 2016-11-04: 50 ug via INTRAVENOUS

## 2016-11-04 MED ORDER — FENTANYL CITRATE (PF) 100 MCG/2ML IJ SOLN
INTRAMUSCULAR | Status: AC
  Filled 2016-11-04: qty 2

## 2016-11-04 MED ORDER — PROPOFOL 10 MG/ML IV BOLUS
INTRAVENOUS | Status: AC
  Filled 2016-11-04: qty 20

## 2016-11-04 MED ORDER — ONDANSETRON HCL 4 MG/2ML IJ SOLN
INTRAMUSCULAR | Status: AC
  Filled 2016-11-04: qty 2

## 2016-11-04 MED ORDER — CHLORHEXIDINE GLUCONATE 4 % EX LIQD: 60.0000 mL | Freq: Once | CUTANEOUS | Status: DC

## 2016-11-04 MED ORDER — LIDOCAINE 2% (20 MG/ML) 5 ML SYRINGE
INTRAMUSCULAR | Status: DC | PRN
  Administered 2016-11-04: 50 mg via INTRAVENOUS

## 2016-11-04 MED ORDER — DEXAMETHASONE SODIUM PHOSPHATE 10 MG/ML IJ SOLN: 10.0000 mg | Freq: Once | INTRAMUSCULAR | Status: DC

## 2016-11-04 MED ORDER — ONDANSETRON HCL 4 MG/2ML IJ SOLN
INTRAMUSCULAR | Status: DC | PRN
  Administered 2016-11-04: 4 mg via INTRAVENOUS

## 2016-11-04 MED ORDER — ACETAMINOPHEN 10 MG/ML IV SOLN
INTRAVENOUS | Status: AC
  Filled 2016-11-04: qty 100

## 2016-11-04 MED ORDER — SODIUM CHLORIDE 0.9 % IV SOLN: INTRAVENOUS | Status: DC

## 2016-11-04 MED ORDER — POVIDONE-IODINE 10 % EX SWAB: 2.0000 "application " | Freq: Once | CUTANEOUS | Status: DC

## 2016-11-04 SURGICAL SUPPLY — 10 items
BANDAGE ADH SHEER 1  50/CT (GAUZE/BANDAGES/DRESSINGS) IMPLANT
COVER SURGICAL LIGHT HANDLE (MISCELLANEOUS) ×1 IMPLANT
GAUZE SPONGE 4X4 12PLY STRL (GAUZE/BANDAGES/DRESSINGS) IMPLANT
GLOVE BIO SURGEON STRL SZ8 (GLOVE) ×1 IMPLANT
GLOVE BIOGEL PI IND STRL 8 (GLOVE) ×1 IMPLANT
GLOVE BIOGEL PI INDICATOR 8 (GLOVE)
NDL SAFETY ECLIPSE 18X1.5 (NEEDLE) IMPLANT
NEEDLE HYPO 18GX1.5 SHARP (NEEDLE)
SWABSTICK PVP IODINE (MISCELLANEOUS) ×1 IMPLANT
SYR CONTROL 10ML LL (SYRINGE) IMPLANT

## 2016-11-04 NOTE — Discharge Instructions (Signed)
General Anesthesia, Adult, Care After These instructions provide you with information about caring for yourself after your procedure. Your health care provider may also give you more specific instructions. Your treatment has been planned according to current medical practices, but problems sometimes occur. Call your health care provider if you have any problems or questions after your procedure. What can I expect after the procedure? After the procedure, it is common to have:  Vomiting.  A sore throat.  Mental slowness.  It is common to feel:  Nauseous.  Cold or shivery.  Sleepy.  Tired.  Sore or achy, even in parts of your body where you did not have surgery.  Follow these instructions at home: For at least 24 hours after the procedure:  Do not: ? Participate in activities where you could fall or become injured. ? Drive. ? Use heavy machinery. ? Drink alcohol. ? Take sleeping pills or medicines that cause drowsiness. ? Make important decisions or sign legal documents. ? Take care of children on your own.  Rest. Eating and drinking  If you vomit, drink water, juice, or soup when you can drink without vomiting.  Drink enough fluid to keep your urine clear or pale yellow.  Make sure you have little or no nausea before eating solid foods.  Follow the diet recommended by your health care provider. General instructions  Have a responsible adult stay with you until you are awake and alert.  Return to your normal activities as told by your health care provider. Ask your health care provider what activities are safe for you.  Take over-the-counter and prescription medicines only as told by your health care provider.  If you smoke, do not smoke without supervision.  Keep all follow-up visits as told by your health care provider. This is important. Contact a health care provider if:  You continue to have nausea or vomiting at home, and medicines are not helpful.  You  cannot drink fluids or start eating again.  You cannot urinate after 8-12 hours.  You develop a skin rash.  You have fever.  You have increasing redness at the site of your procedure. Get help right away if:  You have difficulty breathing.  You have chest pain.  You have unexpected bleeding.  You feel that you are having a life-threatening or urgent problem. This information is not intended to replace advice given to you by your health care provider. Make sure you discuss any questions you have with your health care provider. Document Released: 04/01/2000 Document Revised: 05/29/2015 Document Reviewed: 12/08/2014 Elsevier Interactive Patient Education  2018 [place] activities as tolerated  Begin your physical therapy tomorrow  I was able to flex your knee to 130 degrees

## 2016-11-04 NOTE — Op Note (Signed)
  OPERATIVE REPORT   PREOPERATIVE DIAGNOSIS: Arthrofibrosis, Right  knee.   POSTOPERATIVE DIAGNOSIS: Arthrofibrosis, Right knee.   PROCEDURE:  Right  knee closed manipulation.   SURGEON: Myrtha Tonkovich, MD   ASSISTANT: None.   ANESTHESIA: General.   COMPLICATIONS: None.   CONDITION: Stable to Recovery.   Pre-manipulation range of motion is 5-95.  Post-manipulation range of  Motion is 0-130  PROCEDURE IN DETAIL: After successful administration of general  anesthetic, exam under anesthesia was performed showing range of motion  5-95 degrees. I then placed my chest against the proximal tibia,  flexing the knee with audible lysis of adhesions. I was easily able to  get the knee flexed to 130  degrees. I then put the knee back in extension and with some  patellar manipulation and gentle pressure got to full  Extension.The patient was subsequently awakened and transported to Recovery in  stable condition.    

## 2016-11-04 NOTE — Anesthesia Preprocedure Evaluation (Addendum)
Anesthesia Evaluation  Patient identified by MRN, date of birth, ID band Patient awake    Reviewed: Allergy & Precautions, NPO status , Patient's Chart, lab work & pertinent test results  Airway Mallampati: II  TM Distance: >3 FB Neck ROM: Full    Dental  (+) Dental Advisory Given   Pulmonary shortness of breath, asthma ,    breath sounds clear to auscultation       Cardiovascular hypertension, Pt. on medications  Rhythm:Regular Rate:Normal     Neuro/Psych  Headaches,    GI/Hepatic Neg liver ROS, GERD  ,  Endo/Other  negative endocrine ROS  Renal/GU negative Renal ROS     Musculoskeletal  (+) Arthritis ,   Abdominal   Peds  Hematology negative hematology ROS (+)   Anesthesia Other Findings   Reproductive/Obstetrics                             Anesthesia Physical Anesthesia Plan  ASA: II  Anesthesia Plan: General   Post-op Pain Management:    Induction: Intravenous  PONV Risk Score and Plan: 3 and Ondansetron, Midazolam, Treatment may vary due to age or medical condition and Propofol infusion  Airway Management Planned: Mask and Natural Airway  Additional Equipment:   Intra-op Plan:   Post-operative Plan: Extubation in OR  Informed Consent: I have reviewed the patients History and Physical, chart, labs and discussed the procedure including the risks, benefits and alternatives for the proposed anesthesia with the patient or authorized representative who has indicated his/her understanding and acceptance.   Dental advisory given  Plan Discussed with: CRNA  Anesthesia Plan Comments:         Anesthesia Quick Evaluation

## 2016-11-04 NOTE — H&P (Signed)
CC- Kelsey Mueller is a 71 y.o. female who presents with right knee stiffness.  HPI- . Knee Pain: Patient presents with stiffness involving the  right knee. Onset of the symptoms was several months ago. Inciting event: She had a Total knee arthroplasty in 2015 and did great initially but feels like she has lost motion during the last year without any obvious cause. She is not having pain with this but her function is altered. She has done physical therapy with mild benefit but still has not regained enough motion. She presents today for closed manipulation.   Past Medical History:  Diagnosis Date  . Allergic rhinoconjunctivitis   . Anemia    hx of  . Arthritis   . Asthma    very mild  . Breast cancer [facility])    s/p XRT and lumpectomy 2009  . Depression   . Environmental allergies   . Environmental and seasonal allergies   . Family history of breast cancer   . Family history of ovarian cancer   . GERD (gastroesophageal reflux disease)   . Heart murmur    slight  . Hypertension   . Infertility    took clomid with second pregnancy  . Knee injury 06/13/12   fell, cracked femur under knee cap-right  . Left-sided weakness    left arm due to polio  . Migraine    hx of menstrual  . Obesity   . Osteoporosis   . Personal history of radiation therapy   . Pneumonia    hx of  . Polio    age 44 month old-"slight case"    Past Surgical History:  Procedure Laterality Date  . BREAST BIOPSY     right breast ER/PR +  . BREAST LUMPECTOMY  2009   R. Eston Esters   . HIATAL HERNIA REPAIR  2005   with Nissen   . NISSEN FUNDOPLICATION  3419  . SKIN CANCER DESTRUCTION     had freezing done to remove pre-cancerpus cells to right cheek   . TONSILLECTOMY  71 years old  . TOTAL KNEE ARTHROPLASTY Right 11/15/2013   Procedure: RIGHT TOTAL KNEE ARTHROPLASTY;  Surgeon: Gearlean Alf, MD;  Location: [facility];  Service: Orthopedics;  Laterality: Right;  . TUBAL LIGATION  1976    Prior to Admission  medications   Medication Sig Start Date End Date Taking? Authorizing Provider  amoxicillin (AMOXIL) 500 MG tablet Take 2,000 mg by mouth See admin instructions. 4 tabs before dental work   Yes [provider]  Calcium Carbonate (CALCIUM 600 PO) Take 1 tablet by mouth daily.   Yes [provider]  cholecalciferol (VITAMIN D) 1000 UNITS tablet Take 2,000 Units by mouth daily.   Yes [provider]  DULoxetine (CYMBALTA) 30 MG capsule Take 30 mg by mouth at bedtime.  11/16/15  Yes [provider]  ibuprofen (ADVIL,MOTRIN) 200 MG tablet Take 400 mg by mouth every 8 (eight) hours as needed for mild pain or moderate pain.   Yes [provider]  Multiple Vitamin (MULTIVITAMIN) tablet Take 1 tablet by mouth daily.   Yes [provider]  naproxen sodium (ALEVE) 220 MG tablet Take 220 mg by mouth as needed.   Yes [provider]  olmesartan-hydrochlorothiazide (BENICAR HCT) 20-12.5 MG tablet Take 1 tablet by mouth daily.   Yes [provider]  pantoprazole (PROTONIX) 40 MG tablet Take 20 mg by mouth daily.    Yes [provider]  fexofenadine (ALLEGRA) 180  MG tablet Take 180 mg by mouth daily as needed for allergies.     [provider]   KNEE EXAM antalgic gait, no warmth oreffusion, reduced range of motion (5-100), collateral ligaments intact  Physical Examination: General appearance - alert, well appearing, and in no distress Mental status - alert, oriented to person, place, and time Chest - clear to auscultation, no wheezes, rales or rhonchi, symmetric air entry Heart - normal rate, regular rhythm, normal S1, S2, no murmurs, rubs, clicks or gallops Abdomen - soft, nontender, nondistended, no masses or organomegaly Neurological - alert, oriented, normal speech, no focal findings or movement disorder noted   Asessment/Plan--- Right knee arthrofibrosis- - Plan right knee closed manipulation. Procedure risks and  potential comps discussed with patient who elects to proceed. Goals are decreased pain and increased function with a high likelihood of achieving both

## 2016-11-04 NOTE — Transfer of Care (Signed)
Immediate Anesthesia Transfer of Care Note  Patient: Kelsey Mueller  Procedure(s) Performed: CLOSED MANIPULATION RIGHT KNEE (Right Knee)  Patient Location: PACU  Anesthesia Type:General  Level of Consciousness: awake, alert  and oriented  Airway & Oxygen Therapy: Patient Spontanous Breathing and Patient connected to face mask oxygen  Post-op Assessment: Report given to RN and Post -op Vital signs reviewed and stable  Post vital signs: Reviewed and stable  Last Vitals:  Vitals:   11/04/16 1408  BP: 135/78  Pulse: (!) 54  Resp: 16  Temp: 36.7 C  SpO2: 98%    Last Pain:  Vitals:   11/04/16 1408  TempSrc: Oral         Complications: No apparent anesthesia complications

## 2016-11-04 NOTE — Anesthesia Procedure Notes (Signed)
Procedure Name: General with mask airway Date/Time: 11/04/2016 4:14 PM Performed by: Glory Buff

## 2016-11-04 NOTE — Anesthesia Postprocedure Evaluation (Signed)
Anesthesia Post Note  Patient: Kelsey Mueller  Procedure(s) Performed: CLOSED MANIPULATION RIGHT KNEE (Right Knee)     Patient location during evaluation: PACU Anesthesia Type: General Level of consciousness: awake and alert Pain management: pain level controlled Vital Signs Assessment: post-procedure vital signs reviewed and stable Respiratory status: spontaneous breathing, nonlabored ventilation, respiratory function stable and patient connected to nasal cannula oxygen Cardiovascular status: blood pressure returned to baseline and stable Postop Assessment: no apparent nausea or vomiting Anesthetic complications: no    Last Vitals:  Vitals:   11/04/16 1715 11/04/16 1730  BP: 140/68 (!) 141/70  Pulse: (!) 52 (!) 48  Resp: 12 13  Temp:    SpO2: 92% 92%    Last Pain:  Vitals:   11/04/16 1730  TempSrc:   PainSc: Tyler Deis

## 2016-11-05 ENCOUNTER — Encounter (HOSPITAL_COMMUNITY): Payer: Self-pay | Admitting: Orthopedic Surgery

## 2016-12-12 ENCOUNTER — Other Ambulatory Visit: Payer: Self-pay

## 2016-12-12 ENCOUNTER — Ambulatory Visit (INDEPENDENT_AMBULATORY_CARE_PROVIDER_SITE_OTHER): Payer: Medicare Other | Admitting: Obstetrics and Gynecology

## 2016-12-12 ENCOUNTER — Encounter: Payer: Self-pay | Admitting: Obstetrics and Gynecology

## 2016-12-12 VITALS — BP 130/80 | HR 84 | Resp 14 | Ht 61.25 in | Wt 138.0 lb

## 2016-12-12 DIAGNOSIS — M8580 Other specified disorders of bone density and structure, unspecified site: Secondary | ICD-10-CM | POA: Diagnosis not present

## 2016-12-12 DIAGNOSIS — Z01419 Encounter for gynecological examination (general) (routine) without abnormal findings: Secondary | ICD-10-CM

## 2016-12-12 DIAGNOSIS — N8111 Cystocele, midline: Secondary | ICD-10-CM

## 2016-12-12 DIAGNOSIS — N816 Rectocele: Secondary | ICD-10-CM

## 2016-12-12 NOTE — Progress Notes (Signed)
71 y.o. G3P3 MarriedCaucasianF here for annual exam.   She has h/o a knee replacement 3 years ago and recently had manipulation to break up the scar tissue and is in PT, feeling better. No vaginal bleeding. Sexually active, no dyspareunia.  H/O right breast cancer, s/p lumpectomy and radiation. Negative genetic testing.  She has a h/o osteoporosis, improved with treatment. Saw Rheumatology last year, they recommended she stay off treatment at this time and have a f/u DEXA in 5/19.  She did PT for her mixed urinary incontinence. It did help. Leaks a small amount with exercise and if she is overfull on the way to the bathroom. Only 2 episodes of significant leakage, mostly just dribbling.  Occasionally needs to push on her vaginal wall to defecate. She only notices a vaginal bulge at that time. She does have a h/o a grade 2 cystocele and grade 1-2 rectocele.     Patient's last menstrual period was 01/07/1997.          Sexually active: Yes.    The current method of family planning is post menopausal status.    Exercising: Yes.    doing PT/ Walking Smoker:  no  Health Maintenance: Pap:  12-08-15 WNL NEG HR HPV 09-28-12 WNL  History of abnormal Pap:  no MMG:  10-15-16 WNL  Colonoscopy:  08-15-15 WNL BMD:   05-16-15 osteopenia  TDaP:  09-15-08 Gardasil: N/A   reports that  has never smoked. she has never used smokeless tobacco. She reports that she does not drink alcohol or use drugs.  3 kids, 4 grandchildren. All her kids live here. 2 of her granddaughters are adopted from [place].  Past Medical History:  Diagnosis Date  . Allergic rhinoconjunctivitis   . Anemia    hx of  . Arthritis   . Asthma    very mild  . Breast cancer [facility])    s/p XRT and lumpectomy 2009  . Depression   . Environmental allergies   . Environmental and seasonal allergies   . Family history of breast cancer   . Family history of ovarian cancer   . GERD (gastroesophageal reflux disease)   . Heart murmur    slight   . Hypertension   . Infertility    took clomid with second pregnancy  . Knee injury 06/13/12   fell, cracked femur under knee cap-right  . Left-sided weakness    left arm due to polio  . Migraine    hx of menstrual  . Obesity   . Osteoporosis   . Personal history of radiation therapy   . Pneumonia    hx of  . Polio    age 60 month old-"slight case"    Past Surgical History:  Procedure Laterality Date  . BREAST BIOPSY     right breast ER/PR +  . BREAST LUMPECTOMY  2009   R. Eston Esters   . HIATAL HERNIA REPAIR  2005   with Nissen   . KNEE CLOSED REDUCTION Right 11/04/2016   Procedure: CLOSED MANIPULATION RIGHT KNEE;  Surgeon: Gaynelle Arabian, MD;  Location: [facility];  Service: Orthopedics;  Laterality: Right;  . NISSEN FUNDOPLICATION  4235  . SKIN CANCER DESTRUCTION     had freezing done to remove pre-cancerpus cells to right cheek   . TONSILLECTOMY  71 years old  . TOTAL KNEE ARTHROPLASTY Right 11/15/2013   Procedure: RIGHT TOTAL KNEE ARTHROPLASTY;  Surgeon: Gearlean Alf, MD;  Location: [facility];  Service: Orthopedics;  Laterality:  Right;  Marland Kitchen TUBAL LIGATION  1976    Current Outpatient Medications  Medication Sig Dispense Refill  . amoxicillin (AMOXIL) 500 MG tablet Take 2,000 mg by mouth See admin instructions. 4 tabs before dental work    . Calcium Carbonate (CALCIUM 600 PO) Take 1 tablet by mouth daily.    . cholecalciferol (VITAMIN D) 1000 UNITS tablet Take 2,000 Units by mouth daily.    Marland Kitchen dicyclomine (BENTYL) 10 MG capsule Take 10 mg by mouth every 8 (eight) hours as needed for spasms.    . DULoxetine (CYMBALTA) 30 MG capsule Take 30 mg by mouth at bedtime.     . fexofenadine (ALLEGRA) 180 MG tablet Take 180 mg by mouth daily as needed for allergies.     Marland Kitchen HYDROcodone-acetaminophen (NORCO) 5-325 MG tablet Take 1-2 tablets by mouth every 4 (four) hours as needed for moderate pain. 30 tablet 0  . ibuprofen (ADVIL,MOTRIN) 200 MG tablet Take 400 mg by mouth every 8 (eight)  hours as needed for mild pain or moderate pain.    . Multiple Vitamin (MULTIVITAMIN) tablet Take 1 tablet by mouth daily.    . naproxen sodium (ALEVE) 220 MG tablet Take 220 mg by mouth as needed.    Marland Kitchen olmesartan-hydrochlorothiazide (BENICAR HCT) 20-12.5 MG tablet Take 1 tablet by mouth daily.    . pantoprazole (PROTONIX) 40 MG tablet Take 20 mg by mouth daily.      No current facility-administered medications for this visit.     Family History  Problem Relation Age of Onset  . Breast cancer Paternal Aunt        dx in her 31s, died in her 33s  . Cancer Maternal Uncle        NOS  . Ovarian cancer Maternal Grandmother   . Breast cancer Paternal Aunt        dx in her 5s; bilateral breast cancer; double mastectomy  . Breast cancer Cousin        bilateral breast cancer in her 45s  . Coronary artery disease Neg Hx   GM with either stomach or ovarian cancer.   Review of Systems  Constitutional: Negative.   HENT: Negative.   Eyes: Negative.   Respiratory: Negative.   Cardiovascular: Negative.   Gastrointestinal:       Change in bowel habits  Difficulty with BMs  Endocrine: Negative.   Genitourinary: Negative.   Musculoskeletal: Negative.   Skin: Negative.   Allergic/Immunologic: Negative.   Neurological: Negative.   Psychiatric/Behavioral: Negative.     Exam:   BP 130/80 (BP Location: Right Arm, Patient Position: Sitting, Cuff Size: Normal)   Pulse 84   Resp 14   Ht 5' 1.25" (1.556 m)   Wt 138 lb (62.6 kg)   LMP 01/07/1997   BMI 25.86 kg/m   Weight change: @WEIGHTCHANGE @ Height:   Height: 5' 1.25" (155.6 cm)  Ht Readings from Last 3 Encounters:  12/12/16 5' 1.25" (1.556 m)  11/04/16 5\' 2"  (1.575 m)  10/28/16 5\' 2"  (1.575 m)    General appearance: alert, cooperative and appears stated age Head: Normocephalic, without obvious abnormality, atraumatic Neck: no adenopathy, supple, symmetrical, trachea midline and thyroid normal to inspection and palpation Lungs: clear  to auscultation bilaterally Cardiovascular: regular rate and rhythm Breasts: normal appearance, no masses or tenderness, evidence of right lumpectomy Abdomen: soft, non-tender; non distended,  no masses,  no organomegaly Extremities: extremities normal, atraumatic, no cyanosis or edema Skin: Skin color, texture, turgor normal. No rashes or lesions  Lymph nodes: Cervical, supraclavicular, and axillary nodes normal. No abnormal inguinal nodes palpated Neurologic: Grossly normal   Pelvic: External genitalia:  no lesions              Urethra:  normal appearing urethra with no masses, tenderness or lesions              Bartholins and Skenes: normal                 Vagina: atrophic appearing vagina, small grade 2 cystocele, grade 1-2 rectocele, no uterine prolapse.               Cervix: only anterior lip seen, no lesions               Bimanual Exam:  Uterus:  uterus not well palpated, no masses or tenderness              Adnexa: no mass, fullness, tenderness               Rectovaginal: Confirms               Anus:  normal sphincter tone, no lesions  Chaperone was present for exam.  A:  Well Woman with normal exam  H/O breast cancer  Incontinence improved with PT, currently tolerable  Come issues with emptying her bowel, occasionally needs to reduce her rectocele to defecate  Mild genital prolapse, minimal symptoms  P:   No pap this year  Mammogram and colonoscopy UTD  DEXA in 5/19, due to f/u with Rheumatology then  Discussed breast self exam  Discussed calcium and vit D intake  Discussed metamucil to help with BM's

## 2016-12-12 NOTE — Patient Instructions (Signed)

## 2017-01-07 HISTORY — PX: UPPER GASTROINTESTINAL ENDOSCOPY: SHX188

## 2017-01-29 DIAGNOSIS — Z96659 Presence of unspecified artificial knee joint: Secondary | ICD-10-CM | POA: Insufficient documentation

## 2017-05-01 ENCOUNTER — Other Ambulatory Visit: Payer: Self-pay | Admitting: Physician Assistant

## 2017-05-02 ENCOUNTER — Telehealth: Payer: Self-pay | Admitting: *Deleted

## 2017-05-02 NOTE — Telephone Encounter (Signed)
Received a request in Epic for refills on Protonix 40 mg.  This patient has not been seen since 12-19-2015, saw Dr. Havery Moros..  In the note Dr. Havery Moros said the patient could titrate off the Protonix 40 mg, one daily.  She had changed the way she was eating and was feeling much better.  I denied the refill request  And the patient can call our office if she needs the prescription.

## 2017-06-14 ENCOUNTER — Telehealth: Payer: Self-pay | Admitting: Obstetrics and Gynecology

## 2017-06-14 NOTE — Telephone Encounter (Signed)
Please let the patient know that she continues to have osteopenia. Her lowest T score is in her spine and is -2.0.  She has a 4% decrease in bone density in her spine and a 5% decrease in her right hip. Her FRAX risk for any fracture is 18% and for a hip fracture is 3.4 %. She was previously on medication for osteoporosis and her Rheumatologist took her off. Typically with a hip fracture risk over 3% treatment would be recommended. I think she should f/u with her Rheumatologist to further discuss if treatment is needed given her prior treatments.

## 2017-06-16 NOTE — Telephone Encounter (Signed)
Spoke with patient. Patient states that she does not want to return to see a Rheumatologist at this time. Asking if she can have Prolia injection with her PCP or our office. Advised will review with Dr.Jertson and return call as her Rheumatologist was preciously managing her medication.

## 2017-06-17 NOTE — Telephone Encounter (Signed)
It is possible the Rheumatologist won't recommend treatment because of her prior treatment. This is why I recommended she f/u with them, I feel there opinion would be helpful. Please send a copy of her report to the Rheumatologist

## 2017-06-18 NOTE — Telephone Encounter (Signed)
Spoke with patient. Advised of message as seen below from [place]. Patient verbalizes understanding. BMD faxed to Marella Chimes, PA. Patient will call [facility] Rhe[facility] to discuss next steps.  Routing to provider for final review. Patient agreeable to disposition. Will close encounter.

## 2017-06-23 ENCOUNTER — Encounter: Payer: Self-pay | Admitting: Obstetrics & Gynecology

## 2017-09-05 ENCOUNTER — Other Ambulatory Visit: Payer: Self-pay | Admitting: Obstetrics and Gynecology

## 2017-09-05 DIAGNOSIS — Z1231 Encounter for screening mammogram for malignant neoplasm of breast: Secondary | ICD-10-CM

## 2017-10-20 ENCOUNTER — Ambulatory Visit
Admission: RE | Admit: 2017-10-20 | Discharge: 2017-10-20 | Disposition: A | Discharge status: Home / Self Care | Payer: Medicare Other | Source: Ambulatory Visit | Attending: Obstetrics and Gynecology | Admitting: Obstetrics and Gynecology

## 2017-10-20 DIAGNOSIS — Z1231 Encounter for screening mammogram for malignant neoplasm of breast: Secondary | ICD-10-CM

## 2017-12-16 ENCOUNTER — Telehealth: Payer: Self-pay | Admitting: Gastroenterology

## 2017-12-16 DIAGNOSIS — D649 Anemia, unspecified: Secondary | ICD-10-CM | POA: Insufficient documentation

## 2017-12-16 NOTE — Telephone Encounter (Signed)
Dr. Brigitte Pulse at [facility]. Wants this patient seen this week for anemia

## 2017-12-17 NOTE — Telephone Encounter (Signed)
Contacted the patient and scheduled her for APP appointment 12/22/17 arrive at 1:30 pm.

## 2017-12-17 NOTE — Telephone Encounter (Signed)
Anderson Malta calling from [facility] again stating Dr.Shaw wants pt seen asap for anemia and hem positive stool. Referral sent with lab results. Dr.Armbruster pt-Patty nurse of the week. Please advise. Referral in referral folder upfront if needed.

## 2017-12-22 ENCOUNTER — Encounter: Payer: Self-pay | Admitting: Physician Assistant

## 2017-12-22 ENCOUNTER — Other Ambulatory Visit: Payer: Self-pay | Admitting: *Deleted

## 2017-12-22 ENCOUNTER — Ambulatory Visit: Payer: Medicare Other | Admitting: Physician Assistant

## 2017-12-22 VITALS — BP 96/60 | HR 80 | Ht 62.0 in | Wt 144.6 lb

## 2017-12-22 DIAGNOSIS — R140 Abdominal distension (gaseous): Secondary | ICD-10-CM | POA: Diagnosis not present

## 2017-12-22 DIAGNOSIS — D509 Iron deficiency anemia, unspecified: Secondary | ICD-10-CM

## 2017-12-22 DIAGNOSIS — D508 Other iron deficiency anemias: Secondary | ICD-10-CM

## 2017-12-22 DIAGNOSIS — K921 Melena: Secondary | ICD-10-CM

## 2017-12-22 NOTE — Progress Notes (Signed)
Chief Complaint: Anemia  HPI:    Kelsey Mueller is a 72 year old female with a past medical history as listed below, known to Dr. Havery Moros, who was referred to me by Marton Redwood, MD for a complaint of anemia.      10/28/2016 CBC with a normal hemoglobin of 12.4.  Recent CBC 12/16/2017 shows a hemoglobin low at 9.1 with an MCV high at 103.4.  Iron studies were normal other than a ferritin low at 24.5.  CMP normal.    12/16/2017 office visit with PCP for dizziness and dyspnea on exertion with palpitations.  Also discussed ongoing GI issues describing gas and bloating, not currently on her pantoprazole.  Described seeing melena 05/14/2017.  Described previously taking Naproxen for shoulder pain which had improved.  At that time a rectal exam was done with black stool which was heme positive and was suspected she had an upper GI bleed.  She was started on Protonix 40 mg twice daily.    Repeat CBC 12/18/2017 shows a hemoglobin of 9.6.    08/15/2015 colonoscopy with diverticulosis in the left colon, tortuous colon and otherwise normal.  No repeat was recommended due to age.    Today, patient explains that about a week ago she noticed that she was starting to get dizzy when she would walk up a flight of stairs or stand up from sitting and also had increased shortness of breath with activity along with her heart racing.  On 12/15/2017 she described seeing black stools in the toilet x3 and this occurred 3 times a day for a 4-5 days but has stopped over the past 2 to 3 days.  Patient explains seeing her PCP and being started on Pantoprazole 40 mg twice daily, prior to this did experience some increased gas bloating and reflux but since being on this medication this has all resolved.  Denies any abdominal pain.    Also discusses her chronic issues with some bloating and stomach discomfort in the evenings.  Patient tells me that sometimes she has to sit up until 3 in the morning for all this to be relieved before she  can lay down.  Does tell me that she does have a lot of later in the evening snacks after eating dinner, some of these include ice cream and/or a bowl of Life cereal.  Symptoms do seem some better if she tries to avoid having a late snack.    Does describe history of being on Naproxen twice daily x2 weeks for shoulder pain, she has since discontinued this.    Denies fever, chills, weight loss, anorexia, nausea, vomiting or symptoms that awaken her from sleep.  Past Medical History:  Diagnosis Date  . Allergic rhinoconjunctivitis   . Anemia    hx of  . Arthritis   . Asthma    very mild  . Breast cancer [facility])    s/p XRT and lumpectomy 2009  . Depression   . Environmental allergies   . Environmental and seasonal allergies   . Family history of breast cancer   . Family history of ovarian cancer   . GERD (gastroesophageal reflux disease)   . Heart murmur    slight  . Hypertension   . Infertility    took clomid with second pregnancy  . Knee injury 06/13/12   fell, cracked femur under knee cap-right  . Left-sided weakness    left arm due to polio  . Migraine    hx of menstrual  . Obesity   .  Osteoporosis   . Personal history of radiation therapy   . Pneumonia    hx of  . Polio    age 23 month old-"slight case"    Past Surgical History:  Procedure Laterality Date  . BREAST BIOPSY     right breast ER/PR +  . BREAST LUMPECTOMY  2009   [facility]. [facility]ers   . HIATAL HERNIA REPAIR  2005   with Nissen   . KNEE CLOSED REDUCTION Right 11/04/2016   Procedure: CLOSED MANIPULATION RIGHT KNEE;  Surgeon: Gaynelle Arabian, MD;  Location: [facility];  Service: Orthopedics;  Laterality: Right;  . NISSEN FUNDOPLICATION  1660  . SKIN CANCER DESTRUCTION     had freezing done to remove pre-cancerpus cells to right cheek   . TONSILLECTOMY  72 years old  . TOTAL KNEE ARTHROPLASTY Right 11/15/2013   Procedure: RIGHT TOTAL KNEE ARTHROPLASTY;  Surgeon: Gearlean Alf, MD;  Location: [facility];  Service:  Orthopedics;  Laterality: Right;  . TUBAL LIGATION  1976    Current Outpatient Medications  Medication Sig Dispense Refill  . amoxicillin (AMOXIL) 500 MG tablet Take 2,000 mg by mouth See admin instructions. 4 tabs before dental work    . Calcium Carbonate (CALCIUM 600 PO) Take 1 tablet by mouth daily.    . cholecalciferol (VITAMIN D) 1000 UNITS tablet Take 2,000 Units by mouth daily.    Marland Kitchen dicyclomine (BENTYL) 10 MG capsule Take 10 mg by mouth every 8 (eight) hours as needed for spasms.    . DULoxetine (CYMBALTA) 30 MG capsule Take 30 mg by mouth at bedtime.     . fexofenadine (ALLEGRA) 180 MG tablet Take 180 mg by mouth daily as needed for allergies.     Marland Kitchen HYDROcodone-acetaminophen (NORCO) 5-325 MG tablet Take 1-2 tablets by mouth every 4 (four) hours as needed for moderate pain. 30 tablet 0  . ibuprofen (ADVIL,MOTRIN) 200 MG tablet Take 400 mg by mouth every 8 (eight) hours as needed for mild pain or moderate pain.    . Multiple Vitamin (MULTIVITAMIN) tablet Take 1 tablet by mouth daily.    . naproxen sodium (ALEVE) 220 MG tablet Take 220 mg by mouth as needed.    Marland Kitchen olmesartan-hydrochlorothiazide (BENICAR HCT) 20-12.5 MG tablet Take 1 tablet by mouth daily.    . pantoprazole (PROTONIX) 40 MG tablet Take 20 mg by mouth daily.      No current facility-administered medications for this visit.     Allergies as of 12/22/2017  . (No Known Allergies)    Family History  Problem Relation Age of Onset  . Breast cancer Paternal Aunt        dx in her 48s, died in her 28s  . Cancer Maternal Uncle        NOS  . Ovarian cancer Maternal Grandmother   . Breast cancer Paternal Aunt        dx in her 51s; bilateral breast cancer; double mastectomy  . Breast cancer Cousin        bilateral breast cancer in her 2s  . Coronary artery disease Neg Hx     Social History   Socioeconomic History  . Marital status: Married    Spouse name: Not on file  . Number of children: 3  . Years of education:  Not on file  . Highest education level: Not on file  Occupational History  . Occupation: retired  Scientific laboratory technician  . Financial resource strain: Not on file  . Food insecurity:  Worry: Not on file    Inability: Not on file  . Transportation needs:    Medical: Not on file    Non-medical: Not on file  Tobacco Use  . Smoking status: Never Smoker  . Smokeless tobacco: Never Used  Substance and Sexual Activity  . Alcohol use: No    Alcohol/week: 0.0 standard drinks  . Drug use: No  . Sexual activity: Yes    Partners: Male    Birth control/protection: Post-menopausal  Lifestyle  . Physical activity:    Days per week: Not on file    Minutes per session: Not on file  . Stress: Not on file  Relationships  . Social connections:    Talks on phone: Not on file    Gets together: Not on file    Attends religious service: Not on file    Active member of club or organization: Not on file    Attends meetings of clubs or organizations: Not on file    Relationship status: Not on file  . Intimate partner violence:    Fear of current or ex partner: Not on file    Emotionally abused: Not on file    Physically abused: Not on file    Forced sexual activity: Not on file  Other Topics Concern  . Not on file  Social History Narrative   Married. Retired Pharmacist, hospital Therapist, art)     Review of Systems:    Constitutional: No weight loss, fever or chills Skin: No rash  Cardiovascular:+palpitations Respiratory: +DOE Gastrointestinal: See HPI and otherwise negative Genitourinary: No dysuria  Neurological:+dizziness Musculoskeletal: No new muscle or joint pain Hematologic: No bruising Psychiatric: No history of depression or anxiety   Physical Exam:  Vital signs: BP 96/60   Pulse 80   Ht 5\' 2"  (1.575 m)   Wt 144 lb 9.6 oz (65.6 kg)   LMP 01/07/1997   BMI 26.45 kg/m   Constitutional:   Pleasant Elderly Caucasian female appears to be in NAD, Well developed, Well nourished, alert and  cooperative Respiratory: Respirations even and unlabored. Lungs clear to auscultation bilaterally.   No wheezes, crackles, or rhonchi.  Cardiovascular: Normal S1, S2. No MRG. Regular rate and rhythm. No peripheral edema, cyanosis or pallor.  Gastrointestinal:  Soft, nondistended, nontender. No rebound or guarding. Normal bowel sounds. No appreciable masses or hepatomegaly. Psychiatric: Demonstrates good judgement and reason without abnormal affect or behaviors.  See HPI for recent labs and imaging.  Assessment: 1.  Symptomatic IDA: With heart palpitations, DOE and dizziness, most recent hemoglobin 9.6, ferritin low, history of melena, history of Naproxen usage for 2 weeks; consider upper GI bleed 2.  Melena: Last week which lasted for 4-5 days, 3 stools a day, since starting Pantoprazole 40 twice daily she has seen no further; consider relation to above 3.  Bloating/abdominal discomfort: Previously discussed with Dr. Havery Moros in 2017, seems related to diet/IBS  Plan: 1.  Patient for an EGD in the [place] with Dr. Havery Moros.  Did discuss risks, benefits, limitations and alternatives and patient agrees to proceed. 2.  Continue Pantoprazole 40 mg twice daily, 30-60 minutes before breakfast and dinner 3.  Reviewed antireflux diet and lifestyle modifications. 4.  Provided the patient with a low FODMAP diet handout to see if avoiding certain foods helps with her bloating and discomfort which is likely related to IBS. 5.  Patient to follow in clinic with Dr. Havery Moros after time of procedure.  Ellouise Newer, PA-C [facility] 12/22/2017, 1:27 PM  Cc:  Marton Redwood, MD

## 2017-12-22 NOTE — Patient Instructions (Signed)
You have been scheduled for a Endoscopy. Please follow written instructions given to you at your visit today.  If you use inhalers (even only as needed), please bring them with you on the day of your procedure. Your physician has requested that you go to www.startemmi.com and enter the access code given to you at your visit today. This web site gives a general overview about your procedure. However, you should still follow specific instructions given to you by our office regarding your preparation for the procedure.  Continue Pantoprazole sodium 40 mg twice daily 30-60-min before breakfast and dinner.  We have provided you with a FODMAP diet.

## 2017-12-22 NOTE — Progress Notes (Signed)
Agree with assessment and plan as outlined.  

## 2017-12-24 ENCOUNTER — Ambulatory Visit (AMBULATORY_SURGERY_CENTER): Payer: Medicare Other | Admitting: Gastroenterology

## 2017-12-24 ENCOUNTER — Encounter: Payer: Self-pay | Admitting: Gastroenterology

## 2017-12-24 VITALS — BP 109/86 | HR 62 | Temp 98.4°F | Resp 15 | Ht 62.0 in | Wt 144.0 lb

## 2017-12-24 DIAGNOSIS — K449 Diaphragmatic hernia without obstruction or gangrene: Secondary | ICD-10-CM

## 2017-12-24 DIAGNOSIS — D509 Iron deficiency anemia, unspecified: Secondary | ICD-10-CM

## 2017-12-24 DIAGNOSIS — K2270 Barrett's esophagus without dysplasia: Secondary | ICD-10-CM | POA: Diagnosis not present

## 2017-12-24 DIAGNOSIS — K228 Other specified diseases of esophagus: Secondary | ICD-10-CM | POA: Diagnosis not present

## 2017-12-24 DIAGNOSIS — K259 Gastric ulcer, unspecified as acute or chronic, without hemorrhage or perforation: Secondary | ICD-10-CM | POA: Diagnosis not present

## 2017-12-24 DIAGNOSIS — K3189 Other diseases of stomach and duodenum: Secondary | ICD-10-CM

## 2017-12-24 DIAGNOSIS — K921 Melena: Secondary | ICD-10-CM

## 2017-12-24 MED ORDER — SODIUM CHLORIDE 0.9 % IV SOLN: 500.0000 mL | Freq: Once | INTRAVENOUS | Status: DC

## 2017-12-24 NOTE — Progress Notes (Signed)
Report given to PACU, vss 

## 2017-12-24 NOTE — Patient Instructions (Signed)
YOU HAD AN ENDOSCOPIC PROCEDURE TODAY AT [facility]:   Refer to the procedure report that was given to you for any specific questions about what was found during the examination.  If the procedure report does not answer your questions, please call your gastroenterologist to clarify.  If you requested that your care partner not be given the details of your procedure findings, then the procedure report has been included in a sealed envelope for you to review at your convenience later.  YOU SHOULD EXPECT: Some feelings of bloating in the abdomen. Passage of more gas than usual.  Walking can help get rid of the air that was put into your GI tract during the procedure and reduce the bloating.  Be sure to take your reflux medication on an empty stomach. Read the GERD diet. It might help you.  Please Note:  You might notice some irritation and congestion in your nose or some drainage.  This is from the oxygen used during your procedure.  There is no need for concern and it should clear up in a day or so.  SYMPTOMS TO REPORT IMMEDIATELY:    Following upper endoscopy (EGD)  Vomiting of blood or coffee ground material  New chest pain or pain under the shoulder blades  Painful or persistently difficult swallowing  New shortness of breath  Fever of 100F or higher  Black, tarry-looking stools  For urgent or emergent issues, a gastroenterologist can be reached at any hour by calling [phone].   DIET:  We do recommend a small meal at first, but then you may proceed to your regular diet.  Drink plenty of fluids but you should avoid alcoholic beverages for 24 hours.  Push  fluids today.  ACTIVITY:  You should plan to take it easy for the rest of today and you should NOT DRIVE or use heavy machinery until tomorrow (because of the sedation medicines used during the test).    FOLLOW UP: Our staff will call the number listed on your records the next business day following your  procedure to check on you and address any questions or concerns that you may have regarding the information given to you following your procedure. If we do not reach you, we will leave a message.  However, if you are feeling well and you are not experiencing any problems, there is no need to return our call.  We will assume that you have returned to your regular daily activities without incident.  If any biopsies were taken you will be contacted by phone or by letter within the next 1-3 weeks.  Please call us at [phone] if you have not heard about the biopsies in 3 weeks.    SIGNATURES/CONFIDENTIALITY: You and/or your care partner have signed paperwork which will be entered into your electronic medical record.  These signatures attest to the fact that that the information above on your After Visit Summary has been reviewed and is understood.  Full responsibility of the confidentiality of this discharge information lies with you and/or your care-partner.  Read all handouts given to you by your recovery room nurse.

## 2017-12-24 NOTE — Progress Notes (Signed)
Called to room to assist during endoscopic procedure.  Patient ID and intended procedure confirmed with present staff. Received instructions for my participation in the procedure from the performing physician.  

## 2017-12-24 NOTE — Op Note (Addendum)
[place] Patient Name: Kelsey Mueller Procedure Date: 12/24/2017 10:52 AM MRN: [id number] Endoscopist: Remo Lipps P. Havery Moros , MD Age: 72 Referring MD:  Date of Birth: 11-May-1945 Gender: Female Account #: [id number] Procedure:                Upper GI endoscopy Indications:              Iron deficiency anemia, Melena - history of NSAID                            use, since stopped, now on protonix with resolution                            of symptoms Medicines:                Monitored Anesthesia Care Procedure:                Pre-Anesthesia Assessment:                           - Prior to the procedure, a History and Physical                            was performed, and patient medications and                            allergies were reviewed. The patient's tolerance of                            previous anesthesia was also reviewed. The risks                            and benefits of the procedure and the sedation                            options and risks were discussed with the patient.                            All questions were answered, and informed consent                            was obtained. Prior Anticoagulants: The patient has                            taken no previous anticoagulant or antiplatelet                            agents. ASA Grade Assessment: II - A patient with                            mild systemic disease. After reviewing the risks                            and benefits, the patient was deemed in  satisfactory condition to undergo the procedure.                           After obtaining informed consent, the endoscope was                            passed under direct vision. Throughout the                            procedure, the patient's blood pressure, pulse, and                            oxygen saturations were monitored continuously. The                            Endoscope was introduced through  the mouth, and                            advanced to the second part of duodenum. The upper                            GI endoscopy was accomplished without difficulty.                            The patient tolerated the procedure well. Scope In: Scope Out: Findings:                 Esophagogastric landmarks were identified: the                            Z-line was found at 34 cm, the gastroesophageal                            junction was found at 34 cm and the upper extent of                            the gastric folds was found at 39 cm from the                            incisors.                           A 5 cm hiatal hernia was present along with a                            suspected Nissen fundoplication which appeared                            loose.                           The Z-line was irregular with a short tongue of                            salmon colored  mucosa. Biopsies were taken with a                            cold forceps for histology.                           The exam of the esophagus was otherwise normal.                           Two cratered gastric ulcers with no stigmata of                            bleeding were found in the gastric antrum. The                            largest lesion was 4 mm in largest dimension,                            smallest 21mm in size. Biopsies were taken with a                            cold forceps for histology.                           The exam of the stomach was otherwise normal.                           Biopsies were taken with a cold forceps in the                            gastric body, at the incisura and in the gastric                            antrum for Helicobacter pylori testing.                           A suspected prominent fold versus possible polyp                            was found in the duodenal bulb entering the sweep,                            which was quite angulated. Biopsies were  taken with                            a cold forceps for histology.                           The exam of the duodenum was otherwise normal. Complications:            No immediate complications. Estimated blood loss:                            Minimal. Estimated Blood Loss:  Estimated blood loss was minimal. Impression:               - Esophagogastric landmarks identified.                           - 5 cm hiatal hernia with history of Nissen.                           - Z-line irregular. Biopsied.                           - 2 gastric ulcers with no stigmata of bleeding.                            Biopsied.                           - Biopsies taken to rule out H pylori                           - Suspected prominent fold in the duodenum.                            Biopsied to ensure no adenomatous change.                           Gastric ulcer is the likely cause for the patients                            symptoms, suspect due to NSAIDs Recommendation:           - Patient has a contact number available for                            emergencies. The signs and symptoms of potential                            delayed complications were discussed with the                            patient. Return to normal activities tomorrow.                            Written discharge instructions were provided to the                            patient.                           - Resume previous diet.                           - Continue present medications. Protonix 40mg  twice                            daily for one month, then once daily                           -  NO NSAIDs                           - Await pathology results. Remo Lipps P. Betzabe Bevans, MD 12/24/2017 11:24:46 AM This report has been signed electronically.

## 2017-12-25 ENCOUNTER — Telehealth: Payer: Self-pay

## 2017-12-25 ENCOUNTER — Telehealth: Payer: Self-pay | Admitting: *Deleted

## 2017-12-25 NOTE — Telephone Encounter (Signed)
Follow up call made, left a voicemail, number identifier.

## 2017-12-25 NOTE — Telephone Encounter (Signed)
  Follow up Call-  Call back number 12/24/2017 08/15/2015  Post procedure Call Back phone  # [phone]  Permission to leave phone message Yes Yes  Some recent data might be hidden     Patient questions:  Message left to call us if necessary.  SEcond call.

## 2017-12-29 ENCOUNTER — Encounter: Payer: Self-pay | Admitting: Gastroenterology

## 2018-01-05 NOTE — Progress Notes (Signed)
72 y.o. G3P3 Married White or Caucasian Not Hispanic or Latino female here for annual exam.  Sexually active infrequently, no pain. No vaginal bleeding.  She has a h/o right breast cancer, s/p lumpectomy and radiation. Negative genetic testing.  H/O osteoporosis, improved with treatment. Followed by Rheumatology. She started Prolia in 9/19. She has a h/o a grade 2 cystocele and grade 1-2 rectocele. Not bothering her. Having regular bowel movements.  Previously did PT for mixed urinary incontinence which helped. She has some stress incontinence with valsalva, occasional urge incontinence. Tolerable, doesn't want to go back to PT.   She was having blood in her stool (melena). Had an endoscopy right before Christmas, has 2 ulcers and a possible infection (biopsy done). Hgb down to 8.7. Off NSAID's. Last colonoscopy was in 2017, normal.  She is feeling better, less dizzy, less fatigue.     Patient's last menstrual period was 01/07/1997.          Sexually active: Yes.    The current method of family planning is post menopausal status.    Exercising: Yes.    biking, training twice a week Smoker:  no  Health Maintenance: Pap:  12-08-15 WNL NEG HR HPV 09-28-12 WNL  History of abnormal Pap:  no MMG:  10/20/2017 Birads 1 negative Colonoscopy:  08-15-15 WNL BMD:   06/06/2017 Osteopenia TDaP:  within the last year.  Gardasil: N/A   reports that she has never smoked. She has never used smokeless tobacco. She reports previous alcohol use. She reports that she does not use drugs. Rare ETOH. 3 kids, 4 grandchildren. All her kids live here.2 of her granddaughters are adopted from [place].  Past Medical History:  Diagnosis Date  . Allergic rhinoconjunctivitis   . Anemia    hx of  . Arthritis   . Asthma    very mild  . Breast cancer [facility])    s/p XRT and lumpectomy 2009  . Depression   . Environmental allergies   . Environmental and seasonal allergies   . Family history of breast cancer   .  Family history of ovarian cancer   . GERD (gastroesophageal reflux disease)   . Heart murmur    slight  . Hypertension   . Infertility    took clomid with second pregnancy  . Knee injury 06/13/12   fell, cracked femur under knee cap-right  . Left-sided weakness    left arm due to polio  . Migraine    hx of menstrual  . Obesity   . Osteoporosis   . Personal history of radiation therapy   . Pneumonia    hx of  . Polio    age 38 month old-"slight case"  . Stomach ulcer     Past Surgical History:  Procedure Laterality Date  . BREAST BIOPSY     right breast ER/PR +  . BREAST LUMPECTOMY  2009   R. Eston Esters   . CATARACT EXTRACTION, BILATERAL Bilateral 05/2017, 06/2017  . COLONOSCOPY    . KNEE CLOSED REDUCTION Right 11/04/2016   Procedure: CLOSED MANIPULATION RIGHT KNEE;  Surgeon: Gaynelle Arabian, MD;  Location: [facility];  Service: Orthopedics;  Laterality: Right;  . NISSEN FUNDOPLICATION  9323  . SKIN CANCER DESTRUCTION     had freezing done to remove pre-cancerpus cells to right cheek   . TONSILLECTOMY  72 years old  . TOTAL KNEE ARTHROPLASTY Right 11/15/2013   Procedure: RIGHT TOTAL KNEE ARTHROPLASTY;  Surgeon: Gearlean Alf, MD;  Location:  [facility];  Service: Orthopedics;  Laterality: Right;  . TUBAL LIGATION  1976  . UPPER GASTROINTESTINAL ENDOSCOPY      Current Outpatient Medications  Medication Sig Dispense Refill  . Calcium Carbonate (CALCIUM 600 PO) Take 1 tablet by mouth daily.    . cholecalciferol (VITAMIN D) 1000 UNITS tablet Take 2,000 Units by mouth daily.    . Denosumab (PROLIA [place]) Prolia    . dicyclomine (BENTYL) 10 MG capsule Take 10 mg by mouth every 8 (eight) hours as needed for spasms.    . DULoxetine (CYMBALTA) 30 MG capsule Take 30 mg by mouth at bedtime.     . fexofenadine (ALLEGRA) 180 MG tablet Take 180 mg by mouth daily as needed for allergies.     . Multiple Vitamin (MULTIVITAMIN) tablet Take 1 tablet by mouth daily.    Marland Kitchen olmesartan-hydrochlorothiazide  (BENICAR HCT) 20-12.5 MG tablet Take 1 tablet by mouth daily.    . pantoprazole (PROTONIX) 20 MG tablet Take 20 mg by mouth 2 (two) times daily.    Marland Kitchen amoxicillin (AMOXIL) 500 MG tablet Take 2,000 mg by mouth See admin instructions. 4 tabs before dental work     Current Facility-Administered Medications  Medication Dose Route Frequency Provider Last Rate Last Dose  . 0.9 %  sodium chloride infusion  500 mL Intravenous Once Armbruster, Carlota Raspberry, MD        Family History  Problem Relation Age of Onset  . Breast cancer Paternal Aunt        dx in her 60s, died in her 24s  . Cancer Maternal Uncle        NOS  . Ovarian cancer Maternal Grandmother   . Breast cancer Paternal Aunt        dx in her 8s; bilateral breast cancer; double mastectomy  . Breast cancer Cousin        bilateral breast cancer in her 32s  . Coronary artery disease Neg Hx   . Colon cancer Neg Hx   . Esophageal cancer Neg Hx   . Pancreatic cancer Neg Hx   . Stomach cancer Neg Hx   . Liver disease Neg Hx   . Colon polyps Neg Hx   . Rectal cancer Neg Hx     Review of Systems  Constitutional: Negative.   HENT: Negative.   Eyes: Negative.   Respiratory: Negative.   Cardiovascular: Negative.   Gastrointestinal: Negative.   Endocrine: Negative.   Genitourinary: Negative.   Musculoskeletal: Negative.   Skin: Negative.   Allergic/Immunologic: Negative.   Neurological: Negative.   Hematological: Negative.   Psychiatric/Behavioral: Negative.     Exam:   LMP 01/07/1997   Weight change: @WEIGHTCHANGE @ Height:      Ht Readings from Last 3 Encounters:  12/24/17 5\' 2"  (1.575 m)  12/22/17 5\' 2"  (1.575 m)  12/12/16 5' 1.25" (1.556 m)    General appearance: alert, cooperative and appears stated age Head: Normocephalic, without obvious abnormality, atraumatic Neck: no adenopathy, supple, symmetrical, trachea midline and thyroid normal to inspection and palpation Lungs: clear to auscultation  bilaterally Cardiovascular: regular rate and rhythm Breasts: normal appearance, no masses or tenderness, evidence of right lumpectomy Abdomen: soft, non-tender; non distended,  no masses,  no organomegaly Extremities: extremities normal, atraumatic, no cyanosis or edema Skin: Skin color, texture, turgor normal. No rashes or lesions Lymph nodes: Cervical, supraclavicular, and axillary nodes normal. No abnormal inguinal nodes palpated Neurologic: Grossly normal   Pelvic: External genitalia:  no lesions  Urethra:  normal appearing urethra with no masses, tenderness or lesions              Bartholins and Skenes: normal                 Vagina: atrophic appearing vagina with normal color and discharge, no lesions. Small grade 2 cystocele and rectocele noted with valsalva (only examined supine), no significant uterine prolapse              Cervix: no lesions and stenotic               Bimanual Exam:  Uterus:  normal size, contour, position, consistency, mobility, non-tender              Adnexa: no mass, fullness, tenderness               Rectovaginal: Confirms               Anus:  normal sphincter tone, no lesions  Chaperone was present for exam.  A:  Well Woman with normal exam  H/O breast cancer  Osteopenia  P:   No pap this year  Labs with primary  Recently diagnosed with anemia and ulcers, recommended she start oral iron.   Mammogram and colonoscopy UTD  DEXA UTD, followed by Rheumatology  Discussed breast self exam  Discussed calcium and vit D intake

## 2018-01-08 ENCOUNTER — Ambulatory Visit (INDEPENDENT_AMBULATORY_CARE_PROVIDER_SITE_OTHER): Payer: Medicare Other | Admitting: Obstetrics and Gynecology

## 2018-01-08 ENCOUNTER — Encounter: Payer: Self-pay | Admitting: Obstetrics and Gynecology

## 2018-01-08 ENCOUNTER — Other Ambulatory Visit: Payer: Self-pay

## 2018-01-08 VITALS — BP 128/86 | HR 84 | Ht 62.0 in | Wt 145.6 lb

## 2018-01-08 DIAGNOSIS — Z853 Personal history of malignant neoplasm of breast: Secondary | ICD-10-CM

## 2018-01-08 DIAGNOSIS — M8580 Other specified disorders of bone density and structure, unspecified site: Secondary | ICD-10-CM

## 2018-01-08 DIAGNOSIS — Z01419 Encounter for gynecological examination (general) (routine) without abnormal findings: Secondary | ICD-10-CM | POA: Diagnosis not present

## 2018-01-08 NOTE — Patient Instructions (Signed)
EXERCISE AND DIET:  We recommended that you start or continue a regular exercise program for good health. Regular exercise means any activity that makes your heart beat faster and makes you sweat.  We recommend exercising at least 30 minutes per day at least 3 days a week, preferably 4 or 5.  We also recommend a diet low in fat and sugar.  Inactivity, poor dietary choices and obesity can cause diabetes, heart attack, stroke, and kidney damage, among others.    ALCOHOL AND SMOKING:  Women should limit their alcohol intake to no more than 7 drinks/beers/glasses of wine (combined, not each!) per week. Moderation of alcohol intake to this level decreases your risk of breast cancer and liver damage. And of course, no recreational drugs are part of a healthy lifestyle.  And absolutely no smoking or even second hand smoke. Most people know smoking can cause heart and lung diseases, but did you know it also contributes to weakening of your bones? Aging of your skin?  Yellowing of your teeth and nails?  CALCIUM AND VITAMIN D:  Adequate intake of calcium and Vitamin D are recommended.  The recommendations for exact amounts of these supplements seem to change often, but generally speaking 1,200 mg of calcium (between diet and supplement) and 800 units of Vitamin D per day seems prudent. Certain women may benefit from higher intake of Vitamin D.  If you are among these women, your doctor will have told you during your visit.    PAP SMEARS:  Pap smears, to check for cervical cancer or precancers,  have traditionally been done yearly, although recent scientific advances have shown that most women can have pap smears less often.  However, every woman still should have a physical exam from her gynecologist every year. It will include a breast check, inspection of the vulva and vagina to check for abnormal growths or skin changes, a visual exam of the cervix, and then an exam to evaluate the size and shape of the uterus and  ovaries.  And after 73 years of age, a rectal exam is indicated to check for rectal cancers. We will also provide age appropriate advice regarding health maintenance, like when you should have certain vaccines, screening for sexually transmitted diseases, bone density testing, colonoscopy, mammograms, etc.   MAMMOGRAMS:  All women over 40 years old should have a yearly mammogram. Many facilities now offer a "3D" mammogram, which may cost around $50 extra out of pocket. If possible,  we recommend you accept the option to have the 3D mammogram performed.  It both reduces the number of women who will be called back for extra views which then turn out to be normal, and it is better than the routine mammogram at detecting truly abnormal areas.    COLON CANCER SCREENING: Now recommend starting at age 45. At this time colonoscopy is not covered for routine screening until 50. There are take home tests that can be done between 45-49.   COLONOSCOPY:  Colonoscopy to screen for colon cancer is recommended for all women at age 50.  We know, you hate the idea of the prep.  We agree, BUT, having colon cancer and not knowing it is worse!!  Colon cancer so often starts as a polyp that can be seen and removed at colonscopy, which can quite literally save your life!  And if your first colonoscopy is normal and you have no family history of colon cancer, most women don't have to have it again for   10 years.  Once every ten years, you can do something that may end up saving your life, right?  We will be happy to help you get it scheduled when you are ready.  Be sure to check your insurance coverage so you understand how much it will cost.  It may be covered as a preventative service at no cost, but you should check your particular policy.      Breast Self-Awareness Breast self-awareness means being familiar with how your breasts look and feel. It involves checking your breasts regularly and reporting any changes to your  health care provider. Practicing breast self-awareness is important. A change in your breasts can be a sign of a serious medical problem. Being familiar with how your breasts look and feel allows you to find any problems early, when treatment is more likely to be successful. All women should practice breast self-awareness, including women who have had breast implants. How to do a breast self-exam One way to learn what is normal for your breasts and whether your breasts are changing is to do a breast self-exam. To do a breast self-exam: Look for Changes  1. Remove all the clothing above your waist. 2. Stand in front of a mirror in a room with good lighting. 3. Put your hands on your hips. 4. Push your hands firmly downward. 5. Compare your breasts in the mirror. Look for differences between them (asymmetry), such as: ? Differences in shape. ? Differences in size. ? Puckers, dips, and bumps in one breast and not the other. 6. Look at each breast for changes in your skin, such as: ? Redness. ? Scaly areas. 7. Look for changes in your nipples, such as: ? Discharge. ? Bleeding. ? Dimpling. ? Redness. ? A change in position. Feel for Changes Carefully feel your breasts for lumps and changes. It is best to do this while lying on your back on the floor and again while sitting or standing in the shower or tub with soapy water on your skin. Feel each breast in the following way:  Place the arm on the side of the breast you are examining above your head.  Feel your breast with the other hand.  Start in the nipple area and make  inch (2 cm) overlapping circles to feel your breast. Use the pads of your three middle fingers to do this. Apply light pressure, then medium pressure, then firm pressure. The light pressure will allow you to feel the tissue closest to the skin. The medium pressure will allow you to feel the tissue that is a little deeper. The firm pressure will allow you to feel the tissue  close to the ribs.  Continue the overlapping circles, moving downward over the breast until you feel your ribs below your breast.  Move one finger-width toward the center of the body. Continue to use the  inch (2 cm) overlapping circles to feel your breast as you move slowly up toward your collarbone.  Continue the up and down exam using all three pressures until you reach your armpit.  Write Down What You Find  Write down what is normal for each breast and any changes that you find. Keep a written record with breast changes or normal findings for each breast. By writing this information down, you do not need to depend only on memory for size, tenderness, or location. Write down where you are in your menstrual cycle, if you are still menstruating. If you are having trouble noticing differences   in your breasts, do not get discouraged. With time you will become more familiar with the variations in your breasts and more comfortable with the exam. How often should I examine my breasts? Examine your breasts every month. If you are breastfeeding, the best time to examine your breasts is after a feeding or after using a breast pump. If you menstruate, the best time to examine your breasts is 5-7 days after your period is over. During your period, your breasts are lumpier, and it may be more difficult to notice changes. When should I see my health care provider? See your health care provider if you notice:  A change in shape or size of your breasts or nipples.  A change in the skin of your breast or nipples, such as a reddened or scaly area.  Unusual discharge from your nipples.  A lump or thick area that was not there before.  Pain in your breasts.  Anything that concerns you.  

## 2018-02-16 DIAGNOSIS — M25552 Pain in left hip: Secondary | ICD-10-CM | POA: Insufficient documentation

## 2018-07-01 DIAGNOSIS — K8020 Calculus of gallbladder without cholecystitis without obstruction: Secondary | ICD-10-CM | POA: Insufficient documentation

## 2018-07-01 DIAGNOSIS — I700 Atherosclerosis of aorta: Secondary | ICD-10-CM | POA: Insufficient documentation

## 2018-07-31 ENCOUNTER — Other Ambulatory Visit: Payer: Self-pay | Admitting: Obstetrics and Gynecology

## 2018-07-31 DIAGNOSIS — Z1231 Encounter for screening mammogram for malignant neoplasm of breast: Secondary | ICD-10-CM

## 2018-08-21 DIAGNOSIS — N3941 Urge incontinence: Secondary | ICD-10-CM | POA: Insufficient documentation

## 2018-08-21 DIAGNOSIS — E785 Hyperlipidemia, unspecified: Secondary | ICD-10-CM | POA: Insufficient documentation

## 2018-08-26 ENCOUNTER — Other Ambulatory Visit: Payer: Self-pay | Admitting: Internal Medicine

## 2018-08-26 DIAGNOSIS — I700 Atherosclerosis of aorta: Secondary | ICD-10-CM

## 2018-09-01 ENCOUNTER — Ambulatory Visit
Admission: RE | Admit: 2018-09-01 | Discharge: 2018-09-01 | Disposition: A | Discharge status: Home / Self Care | Payer: Self-pay | Source: Ambulatory Visit | Attending: Internal Medicine | Admitting: Internal Medicine

## 2018-09-01 DIAGNOSIS — I700 Atherosclerosis of aorta: Secondary | ICD-10-CM

## 2018-10-30 ENCOUNTER — Ambulatory Visit
Admission: RE | Admit: 2018-10-30 | Discharge: 2018-10-30 | Disposition: A | Discharge status: Home / Self Care | Payer: Medicare Other | Source: Ambulatory Visit | Attending: Obstetrics and Gynecology | Admitting: Obstetrics and Gynecology

## 2018-10-30 ENCOUNTER — Other Ambulatory Visit: Payer: Self-pay

## 2018-10-30 DIAGNOSIS — Z1231 Encounter for screening mammogram for malignant neoplasm of breast: Secondary | ICD-10-CM

## 2019-01-12 ENCOUNTER — Other Ambulatory Visit: Payer: Self-pay

## 2019-01-13 DIAGNOSIS — R413 Other amnesia: Secondary | ICD-10-CM | POA: Insufficient documentation

## 2019-01-13 NOTE — Progress Notes (Signed)
74 y.o. G3P3 Married White or Caucasian Not Hispanic or Latino female here for annual exam.  Patient states that she fell last Saturday taking the dog out. She states that she blacked out. She saw her pcp yesterday. Primary felt her BP was too low.  Sexually active, no dyspareunia.   H/O grade 2 cystocele and grade 1-2 rectocele. Not bothersome.   H/O mixed incontinence, helped with PT. Tolerable.  She has had some constipation, getting better with medication.  She has a h/o right breast cancer, s/p lumpectomy and radiation. Negative genetic testing.  H/O osteoporosis, improved with treatment. Followed by Rheumatology. She started Prolia in 9/19.    Patient's last menstrual period was 01/07/1997.          Sexually active: Yes.    The current method of family planning is post menopausal status.    Exercising: Yes.    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  Smoker:  no  Health Maintenance: Pap:  12-08-15 WNL NEG HR HPV 09-28-12 WNL History of abnormal Pap:  no MMG:  10/31/18 BMD:   06/06/17 Osteopenia, FRAX 18/3.4% Colonoscopy: 08/15/15 normal TDaP:  2019 Gardasil: NA   reports that she has never smoked. She has never used smokeless tobacco. She reports previous alcohol use. She reports that she does not use drugs. 3 kids, 4 grandchildren. All her kids live here.2 of her granddaughters are adopted from [place].  Past Medical History:  Diagnosis Date  . Allergic rhinoconjunctivitis   . Anemia    hx of  . Arthritis   . Asthma    very mild  . Breast cancer [facility])    s/p XRT and lumpectomy 2009  . Depression   . Environmental allergies   . Environmental and seasonal allergies   . Family history of breast cancer   . Family history of ovarian cancer   . GERD (gastroesophageal reflux disease)   . Heart murmur    slight  . Hypertension   . Infertility    took clomid with second pregnancy  . Knee injury 06/13/12   fell, cracked femur under knee cap-right  .  Left-sided weakness    left arm due to polio  . Migraine    hx of menstrual  . Obesity   . Osteoporosis   . Personal history of radiation therapy   . Pneumonia    hx of  . Polio    age 64 month old-"slight case"  . Stomach ulcer     Past Surgical History:  Procedure Laterality Date  . BREAST BIOPSY     right breast ER/PR +  . BREAST LUMPECTOMY  2009   R. Eston Esters   . CATARACT EXTRACTION, BILATERAL Bilateral 05/2017, 06/2017  . COLONOSCOPY    . KNEE CLOSED REDUCTION Right 11/04/2016   Procedure: CLOSED MANIPULATION RIGHT KNEE;  Surgeon: Gaynelle Arabian, MD;  Location: [facility];  Service: Orthopedics;  Laterality: Right;  . NISSEN FUNDOPLICATION  [id number]  . SKIN CANCER DESTRUCTION     had freezing done to remove pre-cancerpus cells to right cheek   . TONSILLECTOMY  74 years old  . TOTAL KNEE ARTHROPLASTY Right 11/15/2013   Procedure: RIGHT TOTAL KNEE ARTHROPLASTY;  Surgeon: Gearlean Alf, MD;  Location: [facility];  Service: Orthopedics;  Laterality: Right;  . TUBAL LIGATION  1976  . UPPER GASTROINTESTINAL ENDOSCOPY      Current Outpatient Medications  Medication Sig Dispense Refill  . amoxicillin (AMOXIL) 500 MG tablet Take  2,000 mg by mouth See admin instructions. 4 tabs before dental work    . Calcium Carbonate (CALCIUM 600 PO) Take 1 tablet by mouth daily.    . cholecalciferol (VITAMIN D) 1000 UNITS tablet Take 2,000 Units by mouth daily.    . Denosumab (PROLIA [place]) Prolia    . dicyclomine (BENTYL) 10 MG capsule Take 10 mg by mouth every 8 (eight) hours as needed for spasms.    . DULoxetine (CYMBALTA) 30 MG capsule Take 30 mg by mouth at bedtime.     . fexofenadine (ALLEGRA) 180 MG tablet Take 180 mg by mouth daily as needed for allergies.     . Multiple Vitamin (MULTIVITAMIN) tablet Take 1 tablet by mouth daily.    Marland Kitchen olmesartan-hydrochlorothiazide (BENICAR HCT) 20-12.5 MG tablet Take 1 tablet by mouth daily.    . pantoprazole (PROTONIX) 20 MG tablet Take 20 mg by mouth 2 (two)  times daily.     Current Facility-Administered Medications  Medication Dose Route Frequency Provider Last Rate Last Admin  . 0.9 %  sodium chloride infusion  500 mL Intravenous Once Armbruster, Carlota Raspberry, MD        Family History  Problem Relation Age of Onset  . Breast cancer Paternal Aunt        dx in her 93s, died in her 53s  . Cancer Maternal Uncle        NOS  . Ovarian cancer Maternal Grandmother   . Breast cancer Paternal Aunt        dx in her 70s; bilateral breast cancer; double mastectomy  . Breast cancer Cousin        bilateral breast cancer in her 15s  . Coronary artery disease Neg Hx   . Colon cancer Neg Hx   . Esophageal cancer Neg Hx   . Pancreatic cancer Neg Hx   . Stomach cancer Neg Hx   . Liver disease Neg Hx   . Colon polyps Neg Hx   . Rectal cancer Neg Hx     Review of Systems  Constitutional:       Falls     Exam:   LMP 01/07/1997   Weight change: @WEIGHTCHANGE @ Height:      Ht Readings from Last 3 Encounters:  01/08/18 5\' 2"  (1.575 m)  12/24/17 5\' 2"  (1.575 m)  12/22/17 5\' 2"  (1.575 m)    General appearance: alert, cooperative and appears stated age Head: Normocephalic, without obvious abnormality, atraumatic Neck: no adenopathy, supple, symmetrical, trachea midline and thyroid normal to inspection and palpation Lungs: clear to auscultation bilaterally Cardiovascular: regular rate and rhythm Breasts: normal appearance, no masses or tenderness Abdomen: soft, non-tender; non distended,  no masses,  no organomegaly Extremities: extremities normal, atraumatic, no cyanosis or edema Skin: Skin color, texture, turgor normal. No rashes or lesions Lymph nodes: Cervical, supraclavicular, and axillary nodes normal. No abnormal inguinal nodes palpated Neurologic: Grossly normal   Pelvic: External genitalia:  no lesions              Urethra:  normal appearing urethra with no masses, tenderness or lesions              Bartholins and Skenes: normal                  Vagina: atrophic appearing vagina with normal color and discharge, no lesions  Small grade 2 cystocele and grade 2 rectocele              Cervix: no lesions  Bimanual Exam:  Uterus:  normal size, contour, position, consistency, mobility, non-tender              Adnexa: no mass, fullness, tenderness               Rectovaginal: Confirms               Anus:  normal sphincter tone, no lesions  Karmen Bongo chaperoned for the exam.  A:  Well Woman with normal exam  H/O breast cancer  Osteopenia with elevated risk of fracture, followed by Rheumatology  Genital prolapse, rectocele is slightly wors, not bothersome.  Mixed incontinence, tolerable    P:   No pap   Mammogram and colonoscopy are UTD  Discussed breast self exam  Discussed calcium and vit D intake  Labs with primary

## 2019-01-14 ENCOUNTER — Encounter: Payer: Self-pay | Admitting: Obstetrics and Gynecology

## 2019-01-14 ENCOUNTER — Other Ambulatory Visit: Payer: Self-pay

## 2019-01-14 ENCOUNTER — Ambulatory Visit (INDEPENDENT_AMBULATORY_CARE_PROVIDER_SITE_OTHER): Payer: Medicare PPO | Admitting: Obstetrics and Gynecology

## 2019-01-14 VITALS — BP 128/66 | HR 86 | Temp 98.2°F | Ht 61.5 in | Wt 143.0 lb

## 2019-01-14 DIAGNOSIS — Z853 Personal history of malignant neoplasm of breast: Secondary | ICD-10-CM | POA: Diagnosis not present

## 2019-01-14 DIAGNOSIS — N816 Rectocele: Secondary | ICD-10-CM

## 2019-01-14 DIAGNOSIS — M8580 Other specified disorders of bone density and structure, unspecified site: Secondary | ICD-10-CM | POA: Diagnosis not present

## 2019-01-14 DIAGNOSIS — N8111 Cystocele, midline: Secondary | ICD-10-CM

## 2019-01-14 DIAGNOSIS — N3946 Mixed incontinence: Secondary | ICD-10-CM

## 2019-01-14 DIAGNOSIS — Z01419 Encounter for gynecological examination (general) (routine) without abnormal findings: Secondary | ICD-10-CM | POA: Diagnosis not present

## 2019-01-14 NOTE — Patient Instructions (Signed)
EXERCISE AND DIET:  We recommended that you start or continue a regular exercise program for good health. Regular exercise means any activity that makes your heart beat faster and makes you sweat.  We recommend exercising at least 30 minutes per day at least 3 days a week, preferably 4 or 5.  We also recommend a diet low in fat and sugar.  Inactivity, poor dietary choices and obesity can cause diabetes, heart attack, stroke, and kidney damage, among others.    ALCOHOL AND SMOKING:  Women should limit their alcohol intake to no more than 7 drinks/beers/glasses of wine (combined, not each!) per week. Moderation of alcohol intake to this level decreases your risk of breast cancer and liver damage. And of course, no recreational drugs are part of a healthy lifestyle.  And absolutely no smoking or even second hand smoke. Most people know smoking can cause heart and lung diseases, but did you know it also contributes to weakening of your bones? Aging of your skin?  Yellowing of your teeth and nails?  CALCIUM AND VITAMIN D:  Adequate intake of calcium and Vitamin D are recommended.  The recommendations for exact amounts of these supplements seem to change often, but generally speaking 1,200 mg of calcium (between diet and supplement) and 800 units of Vitamin D per day seems prudent. Certain women may benefit from higher intake of Vitamin D.  If you are among these women, your doctor will have told you during your visit.    PAP SMEARS:  Pap smears, to check for cervical cancer or precancers,  have traditionally been done yearly, although recent scientific advances have shown that most women can have pap smears less often.  However, every woman still should have a physical exam from her gynecologist every year. It will include a breast check, inspection of the vulva and vagina to check for abnormal growths or skin changes, a visual exam of the cervix, and then an exam to evaluate the size and shape of the uterus and  ovaries.  And after 74 years of age, a rectal exam is indicated to check for rectal cancers. We will also provide age appropriate advice regarding health maintenance, like when you should have certain vaccines, screening for sexually transmitted diseases, bone density testing, colonoscopy, mammograms, etc.   MAMMOGRAMS:  All women over 40 years old should have a yearly mammogram. Many facilities now offer a "3D" mammogram, which may cost around $50 extra out of pocket. If possible,  we recommend you accept the option to have the 3D mammogram performed.  It both reduces the number of women who will be called back for extra views which then turn out to be normal, and it is better than the routine mammogram at detecting truly abnormal areas.    COLON CANCER SCREENING: Now recommend starting at age 45. At this time colonoscopy is not covered for routine screening until 50. There are take home tests that can be done between 45-49.   COLONOSCOPY:  Colonoscopy to screen for colon cancer is recommended for all women at age 50.  We know, you hate the idea of the prep.  We agree, BUT, having colon cancer and not knowing it is worse!!  Colon cancer so often starts as a polyp that can be seen and removed at colonscopy, which can quite literally save your life!  And if your first colonoscopy is normal and you have no family history of colon cancer, most women don't have to have it again for   10 years.  Once every ten years, you can do something that may end up saving your life, right?  We will be happy to help you get it scheduled when you are ready.  Be sure to check your insurance coverage so you understand how much it will cost.  It may be covered as a preventative service at no cost, but you should check your particular policy.      Breast Self-Awareness Breast self-awareness means being familiar with how your breasts look and feel. It involves checking your breasts regularly and reporting any changes to your  health care provider. Practicing breast self-awareness is important. A change in your breasts can be a sign of a serious medical problem. Being familiar with how your breasts look and feel allows you to find any problems early, when treatment is more likely to be successful. All women should practice breast self-awareness, including women who have had breast implants. How to do a breast self-exam One way to learn what is normal for your breasts and whether your breasts are changing is to do a breast self-exam. To do a breast self-exam: Look for Changes  1. Remove all the clothing above your waist. 2. Stand in front of a mirror in a room with good lighting. 3. Put your hands on your hips. 4. Push your hands firmly downward. 5. Compare your breasts in the mirror. Look for differences between them (asymmetry), such as: ? Differences in shape. ? Differences in size. ? Puckers, dips, and bumps in one breast and not the other. 6. Look at each breast for changes in your skin, such as: ? Redness. ? Scaly areas. 7. Look for changes in your nipples, such as: ? Discharge. ? Bleeding. ? Dimpling. ? Redness. ? A change in position. Feel for Changes Carefully feel your breasts for lumps and changes. It is best to do this while lying on your back on the floor and again while sitting or standing in the shower or tub with soapy water on your skin. Feel each breast in the following way:  Place the arm on the side of the breast you are examining above your head.  Feel your breast with the other hand.  Start in the nipple area and make  inch (2 cm) overlapping circles to feel your breast. Use the pads of your three middle fingers to do this. Apply light pressure, then medium pressure, then firm pressure. The light pressure will allow you to feel the tissue closest to the skin. The medium pressure will allow you to feel the tissue that is a little deeper. The firm pressure will allow you to feel the tissue  close to the ribs.  Continue the overlapping circles, moving downward over the breast until you feel your ribs below your breast.  Move one finger-width toward the center of the body. Continue to use the  inch (2 cm) overlapping circles to feel your breast as you move slowly up toward your collarbone.  Continue the up and down exam using all three pressures until you reach your armpit.  Write Down What You Find  Write down what is normal for each breast and any changes that you find. Keep a written record with breast changes or normal findings for each breast. By writing this information down, you do not need to depend only on memory for size, tenderness, or location. Write down where you are in your menstrual cycle, if you are still menstruating. If you are having trouble noticing differences   in your breasts, do not get discouraged. With time you will become more familiar with the variations in your breasts and more comfortable with the exam. How often should I examine my breasts? Examine your breasts every month. If you are breastfeeding, the best time to examine your breasts is after a feeding or after using a breast pump. If you menstruate, the best time to examine your breasts is 5-7 days after your period is over. During your period, your breasts are lumpier, and it may be more difficult to notice changes. When should I see my health care provider? See your health care provider if you notice:  A change in shape or size of your breasts or nipples.  A change in the skin of your breast or nipples, such as a reddened or scaly area.  Unusual discharge from your nipples.  A lump or thick area that was not there before.  Pain in your breasts.  Anything that concerns you.  

## 2019-01-27 ENCOUNTER — Ambulatory Visit: Payer: Medicare PPO | Attending: Internal Medicine

## 2019-01-27 DIAGNOSIS — Z23 Encounter for immunization: Secondary | ICD-10-CM | POA: Insufficient documentation

## 2019-02-15 ENCOUNTER — Ambulatory Visit: Payer: Medicare PPO | Attending: Internal Medicine

## 2019-02-15 DIAGNOSIS — Z23 Encounter for immunization: Secondary | ICD-10-CM | POA: Insufficient documentation

## 2019-02-15 NOTE — Progress Notes (Signed)
   Covid-19 Vaccination Clinic  Name:  Kelsey Mueller    MRN: [id number] DOB: August 29, 1945  02/15/2019  Kelsey Mueller was observed post Covid-19 immunization for 15 minutes without incidence. She was provided with Vaccine Information Sheet and instruction to access the V-Safe system.   Kelsey Mueller was instructed to call 911 with any severe reactions post vaccine: Marland Kitchen Difficulty breathing  . Swelling of your face and throat  . A fast heartbeat  . A bad rash all over your body  . Dizziness and weakness    Immunizations Administered    Name Date Dose VIS Date Route   Pfizer COVID-19 Vaccine 02/15/2019  2:00 PM 0.3 mL 12/18/2018 Intramuscular   Manufacturer: [place]   Lot: [id number]   [place]: [id number]

## 2019-03-02 ENCOUNTER — Ambulatory Visit: Payer: Medicare Other

## 2019-04-30 DIAGNOSIS — M25551 Pain in right hip: Secondary | ICD-10-CM | POA: Diagnosis not present

## 2019-04-30 DIAGNOSIS — M7062 Trochanteric bursitis, left hip: Secondary | ICD-10-CM | POA: Diagnosis not present

## 2019-04-30 DIAGNOSIS — M25552 Pain in left hip: Secondary | ICD-10-CM | POA: Diagnosis not present

## 2019-04-30 DIAGNOSIS — M7061 Trochanteric bursitis, right hip: Secondary | ICD-10-CM | POA: Diagnosis not present

## 2019-05-05 ENCOUNTER — Telehealth: Payer: Self-pay | Admitting: Gastroenterology

## 2019-05-05 MED ORDER — PANTOPRAZOLE SODIUM 20 MG PO TBEC: 20.0000 mg | DELAYED_RELEASE_TABLET | Freq: Every day | ORAL | 1 refills | Status: DC

## 2019-05-05 NOTE — Telephone Encounter (Signed)
Called and spoke to patient. Patient has not been seen since 12-2017 - EGD. Dr. Havery Moros prescribed Protonix 20 mg once daily but she has not been taking it.  She has changed pharmacies and is now using [facility] on [address].  Updated pharmacy in chart   Scheduled her for an appt on 6-3. Sent Protonix to get to her 06-10-19 appt. Patient in agreement

## 2019-05-05 NOTE — Telephone Encounter (Signed)
Pt is requesting rf for her medication for barrets, she did not remember the name of the med. She needs that prescription sent to her [facility] [address]. Pt will  No longer use other pharmacies on file.

## 2019-05-07 ENCOUNTER — Telehealth: Payer: Self-pay | Admitting: Gastroenterology

## 2019-05-07 MED ORDER — PANTOPRAZOLE SODIUM 20 MG PO TBEC: 20.0000 mg | DELAYED_RELEASE_TABLET | Freq: Every day | ORAL | 1 refills | Status: DC

## 2019-05-07 NOTE — Telephone Encounter (Signed)
I spoke with Danton Clap and she said [facility] found the rx.

## 2019-06-10 ENCOUNTER — Ambulatory Visit: Payer: Medicare PPO | Admitting: Gastroenterology

## 2019-06-24 DIAGNOSIS — Z96651 Presence of right artificial knee joint: Secondary | ICD-10-CM | POA: Diagnosis not present

## 2019-06-24 DIAGNOSIS — Z471 Aftercare following joint replacement surgery: Secondary | ICD-10-CM | POA: Diagnosis not present

## 2019-06-24 DIAGNOSIS — T8484XA Pain due to internal orthopedic prosthetic devices, implants and grafts, initial encounter: Secondary | ICD-10-CM | POA: Diagnosis not present

## 2019-07-05 DIAGNOSIS — T849XXA Unspecified complication of internal orthopedic prosthetic device, implant and graft, initial encounter: Secondary | ICD-10-CM | POA: Insufficient documentation

## 2019-07-08 DIAGNOSIS — Z20822 Contact with and (suspected) exposure to covid-19: Secondary | ICD-10-CM | POA: Diagnosis not present

## 2019-07-28 ENCOUNTER — Ambulatory Visit: Payer: Medicare PPO | Admitting: Gastroenterology

## 2019-07-28 ENCOUNTER — Encounter: Payer: Self-pay | Admitting: Gastroenterology

## 2019-07-28 VITALS — BP 110/80 | HR 64 | Ht 61.5 in | Wt 142.0 lb

## 2019-07-28 DIAGNOSIS — Z8719 Personal history of other diseases of the digestive system: Secondary | ICD-10-CM

## 2019-07-28 DIAGNOSIS — K2270 Barrett's esophagus without dysplasia: Secondary | ICD-10-CM

## 2019-07-28 DIAGNOSIS — D649 Anemia, unspecified: Secondary | ICD-10-CM | POA: Diagnosis not present

## 2019-07-28 DIAGNOSIS — Z8711 Personal history of peptic ulcer disease: Secondary | ICD-10-CM

## 2019-07-28 NOTE — Progress Notes (Signed)
HPI :  74 year old female here for a follow-up visit for history of ulcer, GERD, anemia.  She was seen in 2019 for symptoms of melena and anemia.  She was treated with Protonix empirically in underwent upper endoscopy.  She had 2 small gastric ulcers that were clean based, biopsies around the ulcer periphery showed no dysplastic change.  She tested negative for H. pylori, ulcers presumed to be due to NSAIDs at the time of her presentation.  She had a prominent duodenal fold which was biopsied and otherwise negative.  She did really well with treatment of the ulcer and her symptoms have resolved.  Of note, on the endoscopy she had an irregular Z-line with a short segment of Barrett's esophagus.  She is tapered down and taking 20 mg Protonix once a day for that.  Of note she does have a history of osteoporosis and has a history of creatinine 1.5 on last records, patient states she has had a fluctuating hemoglobin since her procedure although I do not see records of her labs on file today, done at her primary care's office, Dr. Brigitte Pulse.  Overall she is doing really well.  Denies any reflux symptoms.  No abdominal pains, she is eating well.  She has not had any recurrence of melena or dark stools.  She has been avoiding all NSAIDs.  He does take Linzess for chronic constipation and states it works quite well.  She is happy with the regimen.    EGD 12/24/17 -  - A 5 cm hiatal hernia was present along with a suspected Nissen fundoplication which appeared loose. - The Z-line was irregular with a short tongue of salmon colored mucosa. Biopsies were taken with a cold forceps for histology. - The exam of the esophagus was otherwise normal. - Two cratered gastric ulcers with no stigmata of bleeding were found in the gastric antrum. The largest lesion was 4 mm in largest dimension, smallest 61mm in size. Biopsies were taken with a cold forceps for histology. - The exam of the stomach was otherwise normal. -  Biopsies were taken with a cold forceps in the gastric body, at the incisura and in the gastric antrum for Helicobacter pylori testing. - A suspected prominent fold versus possible polyp was found in the duodenal bulb entering the sweep, which was quite angulated. Biopsies were taken with a cold forceps for histology. - The exam of the duodenum was otherwise normal.   08/15/2015 colonoscopy with diverticulosis in the left colon, tortuous colon and otherwise normal.  No repeat was recommended due to age.   Past Medical History:  Diagnosis Date  . Allergic rhinoconjunctivitis   . Anemia    hx of  . Arthritis   . Asthma    very mild  . Breast cancer [facility])    s/p XRT and lumpectomy 2009  . Depression   . Environmental allergies   . Environmental and seasonal allergies   . Family history of breast cancer   . Family history of ovarian cancer   . GERD (gastroesophageal reflux disease)   . Heart murmur    slight  . Hypertension   . Infertility    took clomid with second pregnancy  . Knee injury 06/13/12   fell, cracked femur under knee cap-right  . Left-sided weakness    left arm due to polio  . Migraine    hx of menstrual  . Obesity   . Osteoporosis   . Personal history of radiation therapy   .  Pneumonia    hx of  . Polio    age 46 month old-"slight case"  . Stomach ulcer      Past Surgical History:  Procedure Laterality Date  . BREAST BIOPSY     right breast ER/PR +  . BREAST LUMPECTOMY  2009   R. Eston Esters   . CATARACT EXTRACTION, BILATERAL Bilateral 05/2017, 06/2017  . COLONOSCOPY    . KNEE CLOSED REDUCTION Right 11/04/2016   Procedure: CLOSED MANIPULATION RIGHT KNEE;  Surgeon: Gaynelle Arabian, MD;  Location: [facility];  Service: Orthopedics;  Laterality: Right;  . NISSEN FUNDOPLICATION  4540  . SKIN CANCER DESTRUCTION     had freezing done to remove pre-cancerpus cells to right cheek   . TONSILLECTOMY  73 years old  . TOTAL KNEE ARTHROPLASTY Right 11/15/2013    Procedure: RIGHT TOTAL KNEE ARTHROPLASTY;  Surgeon: Gearlean Alf, MD;  Location: [facility];  Service: Orthopedics;  Laterality: Right;  . TUBAL LIGATION  1976  . UPPER GASTROINTESTINAL ENDOSCOPY     Family History  Problem Relation Age of Onset  . Breast cancer Paternal Aunt        dx in her 69s, died in her 39s  . Cancer Maternal Uncle        NOS  . Ovarian cancer Maternal Grandmother   . Breast cancer Paternal Aunt        dx in her 87s; bilateral breast cancer; double mastectomy  . Breast cancer Cousin        bilateral breast cancer in her 44s  . Coronary artery disease Neg Hx   . Colon cancer Neg Hx   . Esophageal cancer Neg Hx   . Pancreatic cancer Neg Hx   . Stomach cancer Neg Hx   . Liver disease Neg Hx   . Colon polyps Neg Hx   . Rectal cancer Neg Hx    Social History   Tobacco Use  . Smoking status: Never Smoker  . Smokeless tobacco: Never Used  Vaping Use  . Vaping Use: Never used  Substance Use Topics  . Alcohol use: Not Currently  . Drug use: No   Current Outpatient Medications  Medication Sig Dispense Refill  . amoxicillin (AMOXIL) 500 MG tablet Take 2,000 mg by mouth See admin instructions. 4 tabs before dental work    . Calcium Carbonate (CALCIUM 600 PO) Take 1 tablet by mouth daily.    . cholecalciferol (VITAMIN D) 1000 UNITS tablet Take 2,000 Units by mouth daily.    . Denosumab (PROLIA [place]) Prolia    . dicyclomine (BENTYL) 10 MG capsule Take 10 mg by mouth every 8 (eight) hours as needed for spasms.    . DULoxetine (CYMBALTA) 30 MG capsule Take 30 mg by mouth at bedtime.     . fexofenadine (ALLEGRA) 180 MG tablet Take 180 mg by mouth daily as needed for allergies.     Marland Kitchen linaclotide (LINZESS) 72 MCG capsule Linzess 72 mcg capsule    . Multiple Vitamin (MULTIVITAMIN) tablet Take 1 tablet by mouth daily.    Marland Kitchen olmesartan-hydrochlorothiazide (BENICAR HCT) 20-12.5 MG tablet Take 1 tablet by mouth daily.    . pantoprazole (PROTONIX) 20 MG tablet Take 1 tablet  (20 mg total) by mouth daily. Please keep June appointment for further refills. 30 tablet 1   Current Facility-Administered Medications  Medication Dose Route Frequency Provider Last Rate Last Admin  . 0.9 %  sodium chloride infusion  500 mL Intravenous Once Tara Rud, Carlota Raspberry, MD  No Known Allergies   Review of Systems: All systems reviewed and negative except where noted in HPI.    No recent labs on file  Physical Exam: BP 110/80   Pulse 64   Ht 5' 1.5" (1.562 m)   Wt 142 lb (64.4 kg)   LMP 01/07/1997   BMI 26.40 kg/m  Constitutional: Pleasant,well-developed, female in no acute distress. Neurological: Alert and oriented to person place and time. Psychiatric: Normal mood and affect. Behavior is normal.   ASSESSMENT AND PLAN: 74 year old female here for reassessment of the following:  Barrett's Esophagus - very short segment noted on her last exam, counseled her on what this is, this is an extremely low risk lesion development of esophageal cancer.  She has been on low-dose Protonix over the past year and tolerating it really well, no reflux symptoms.  We discussed long-term risk benefits of chronic PPI therapy.  She does have osteoporosis and at high risk for fracture, she also has what may be CKD although we will need to get labs from her primary care to assess her renal function.  We will plan on a repeat endoscopy 3 years from her last exam.  She agreed  History of Gastric ulcer / Anemia - small NSAID induced ulcer causing anemia on her last exam.  H. pylori negative, she stopped NSAIDs, on chronic PPI at this time as above.  All of her symptoms have since resolved.  Biopsies of the ulcer periphery showed no precancerous change, given the small size and the path results I do not feel strongly she warrants a follow-up endoscopy, we discussed this but she does not wish to have another endoscopy at this time.  That being said I asked to get records of her blood counts from  her primary care, if she for some reason she has a persistent anemia, microcytosis, will consider EGD to ensure healing of ulcer.   Ramah Cellar, MD [facility]

## 2019-07-28 NOTE — Patient Instructions (Signed)
If you are age 74 or older, your body mass index should be between 23-30. Your Body mass index is 26.4 kg/m. If this is out of the aforementioned range listed, please consider follow up with your Primary Care Provider.  If you are age 9 or younger, your body mass index should be between 19-25. Your Body mass index is 26.4 kg/m. If this is out of the aformentioned range listed, please consider follow up with your Primary Care Provider.   Please have your PCP send [place] your labs from your upcoming physical exam.  Thank you for entrusting me with your care and for choosing [facility], Dr. Crompond Cellar

## 2019-08-06 DIAGNOSIS — M7062 Trochanteric bursitis, left hip: Secondary | ICD-10-CM | POA: Diagnosis not present

## 2019-08-06 DIAGNOSIS — M25551 Pain in right hip: Secondary | ICD-10-CM | POA: Diagnosis not present

## 2019-08-06 DIAGNOSIS — M7061 Trochanteric bursitis, right hip: Secondary | ICD-10-CM | POA: Diagnosis not present

## 2019-08-06 DIAGNOSIS — M25552 Pain in left hip: Secondary | ICD-10-CM | POA: Diagnosis not present

## 2019-09-02 DIAGNOSIS — M810 Age-related osteoporosis without current pathological fracture: Secondary | ICD-10-CM | POA: Diagnosis not present

## 2019-09-02 DIAGNOSIS — E785 Hyperlipidemia, unspecified: Secondary | ICD-10-CM | POA: Diagnosis not present

## 2019-09-06 DIAGNOSIS — Z6826 Body mass index (BMI) 26.0-26.9, adult: Secondary | ICD-10-CM | POA: Diagnosis not present

## 2019-09-06 DIAGNOSIS — M8589 Other specified disorders of bone density and structure, multiple sites: Secondary | ICD-10-CM | POA: Diagnosis not present

## 2019-09-06 DIAGNOSIS — E663 Overweight: Secondary | ICD-10-CM | POA: Diagnosis not present

## 2019-09-06 DIAGNOSIS — M810 Age-related osteoporosis without current pathological fracture: Secondary | ICD-10-CM | POA: Diagnosis not present

## 2019-09-09 DIAGNOSIS — M810 Age-related osteoporosis without current pathological fracture: Secondary | ICD-10-CM | POA: Diagnosis not present

## 2019-09-09 DIAGNOSIS — I129 Hypertensive chronic kidney disease with stage 1 through stage 4 chronic kidney disease, or unspecified chronic kidney disease: Secondary | ICD-10-CM | POA: Diagnosis not present

## 2019-09-09 DIAGNOSIS — Z0000 Encounter for general adult medical examination without abnormal findings: Secondary | ICD-10-CM | POA: Diagnosis not present

## 2019-09-09 DIAGNOSIS — E785 Hyperlipidemia, unspecified: Secondary | ICD-10-CM | POA: Diagnosis not present

## 2019-09-09 DIAGNOSIS — R82998 Other abnormal findings in urine: Secondary | ICD-10-CM | POA: Diagnosis not present

## 2019-09-09 DIAGNOSIS — F3342 Major depressive disorder, recurrent, in full remission: Secondary | ICD-10-CM | POA: Diagnosis not present

## 2019-09-09 DIAGNOSIS — K219 Gastro-esophageal reflux disease without esophagitis: Secondary | ICD-10-CM | POA: Diagnosis not present

## 2019-09-09 DIAGNOSIS — I700 Atherosclerosis of aorta: Secondary | ICD-10-CM | POA: Diagnosis not present

## 2019-09-09 DIAGNOSIS — G250 Essential tremor: Secondary | ICD-10-CM | POA: Diagnosis not present

## 2019-09-09 DIAGNOSIS — I10 Essential (primary) hypertension: Secondary | ICD-10-CM | POA: Diagnosis not present

## 2019-09-09 DIAGNOSIS — N1831 Chronic kidney disease, stage 3a: Secondary | ICD-10-CM | POA: Diagnosis not present

## 2019-09-21 ENCOUNTER — Other Ambulatory Visit: Payer: Self-pay | Admitting: Obstetrics and Gynecology

## 2019-09-21 DIAGNOSIS — Z1231 Encounter for screening mammogram for malignant neoplasm of breast: Secondary | ICD-10-CM

## 2019-09-23 DIAGNOSIS — M25552 Pain in left hip: Secondary | ICD-10-CM | POA: Diagnosis not present

## 2019-09-23 DIAGNOSIS — M7061 Trochanteric bursitis, right hip: Secondary | ICD-10-CM | POA: Diagnosis not present

## 2019-09-23 DIAGNOSIS — M25551 Pain in right hip: Secondary | ICD-10-CM | POA: Diagnosis not present

## 2019-09-23 DIAGNOSIS — M7062 Trochanteric bursitis, left hip: Secondary | ICD-10-CM | POA: Diagnosis not present

## 2019-10-01 DIAGNOSIS — Z1212 Encounter for screening for malignant neoplasm of rectum: Secondary | ICD-10-CM | POA: Diagnosis not present

## 2019-10-07 DIAGNOSIS — M810 Age-related osteoporosis without current pathological fracture: Secondary | ICD-10-CM | POA: Diagnosis not present

## 2019-11-01 ENCOUNTER — Ambulatory Visit: Payer: Medicare PPO

## 2019-11-04 DIAGNOSIS — M7061 Trochanteric bursitis, right hip: Secondary | ICD-10-CM | POA: Diagnosis not present

## 2019-11-04 DIAGNOSIS — M25552 Pain in left hip: Secondary | ICD-10-CM | POA: Diagnosis not present

## 2019-11-04 DIAGNOSIS — M7062 Trochanteric bursitis, left hip: Secondary | ICD-10-CM | POA: Diagnosis not present

## 2019-11-04 DIAGNOSIS — M25551 Pain in right hip: Secondary | ICD-10-CM | POA: Diagnosis not present

## 2019-11-22 ENCOUNTER — Ambulatory Visit: Payer: Medicare PPO

## 2019-11-29 ENCOUNTER — Ambulatory Visit
Admission: RE | Admit: 2019-11-29 | Discharge: 2019-11-29 | Disposition: A | Discharge status: Home / Self Care | Payer: Medicare PPO | Source: Ambulatory Visit | Attending: Obstetrics and Gynecology | Admitting: Obstetrics and Gynecology

## 2019-11-29 ENCOUNTER — Other Ambulatory Visit: Payer: Self-pay

## 2019-11-29 DIAGNOSIS — Z1231 Encounter for screening mammogram for malignant neoplasm of breast: Secondary | ICD-10-CM

## 2019-12-16 DIAGNOSIS — M25551 Pain in right hip: Secondary | ICD-10-CM | POA: Diagnosis not present

## 2019-12-16 DIAGNOSIS — M7061 Trochanteric bursitis, right hip: Secondary | ICD-10-CM | POA: Diagnosis not present

## 2019-12-16 DIAGNOSIS — M25552 Pain in left hip: Secondary | ICD-10-CM | POA: Diagnosis not present

## 2019-12-16 DIAGNOSIS — M7062 Trochanteric bursitis, left hip: Secondary | ICD-10-CM | POA: Diagnosis not present

## 2019-12-20 DIAGNOSIS — Z961 Presence of intraocular lens: Secondary | ICD-10-CM | POA: Diagnosis not present

## 2019-12-20 DIAGNOSIS — H52203 Unspecified astigmatism, bilateral: Secondary | ICD-10-CM | POA: Diagnosis not present

## 2020-01-26 ENCOUNTER — Ambulatory Visit: Payer: Medicare PPO | Admitting: Obstetrics and Gynecology

## 2020-01-27 DIAGNOSIS — G8929 Other chronic pain: Secondary | ICD-10-CM | POA: Diagnosis not present

## 2020-01-27 DIAGNOSIS — M7062 Trochanteric bursitis, left hip: Secondary | ICD-10-CM | POA: Diagnosis not present

## 2020-01-27 DIAGNOSIS — M25551 Pain in right hip: Secondary | ICD-10-CM | POA: Diagnosis not present

## 2020-01-27 DIAGNOSIS — M7061 Trochanteric bursitis, right hip: Secondary | ICD-10-CM | POA: Diagnosis not present

## 2020-01-27 DIAGNOSIS — M25552 Pain in left hip: Secondary | ICD-10-CM | POA: Diagnosis not present

## 2020-01-27 DIAGNOSIS — M545 Low back pain, unspecified: Secondary | ICD-10-CM | POA: Diagnosis not present

## 2020-02-01 NOTE — Progress Notes (Signed)
75 y.o. G3P3 Married White or Caucasian Not Hispanic or Latino female here for breast & pelvic exam.  No vaginal bleeding, no dyspareunia.   She has a h/o right breast cancer, s/p lumpectomy and radiation. Negative genetic testing.  H/O osteoporosis, improved with treatment. Followed by Rheumatology. She started Prolia in 9/19.  H/O grade 2 cystocele and grade 2 rectocele.  Not really bothering her. Normal bowel and bladder function.   She has a h/o mixed incontinence, PT didn't help. Myrbetriq helps some. She is wearing the menstrual underwear which keeps her dry.    Patient's last menstrual period was 01/07/1997.          Sexually active: Yes.    The current method of family planning is post menopausal status.    Exercising: No.  exercise Smoker:  no  Health Maintenance: Pap:  12-08-15 neg HPV HR neg History of abnormal Pap:  no MMG:  12-02-2019 category b density birads 1:neg BMD:   06-06-17 osteopenia, FRAX 18/3.4% Colonoscopy: 08-15-15 normal TDaP:  2019 Gardasil: n/a   reports that she has never smoked. She has never used smokeless tobacco. She reports previous alcohol use. She reports that she does not use drugs. Rare ETOH. 3 kids, 4 grandchildren. All her kids live here.2 of her granddaughters are adopted from [place].  Past Medical History:  Diagnosis Date  . Allergic rhinoconjunctivitis   . Anemia    hx of  . Arthritis   . Asthma    very mild  . Breast cancer [facility])    s/p XRT and lumpectomy 2009  . Depression   . Environmental allergies   . Environmental and seasonal allergies   . Family history of breast cancer   . Family history of ovarian cancer   . GERD (gastroesophageal reflux disease)   . Heart murmur    slight  . Hypertension   . Infertility    took clomid with second pregnancy  . Knee injury 06/13/12   fell, cracked femur under knee cap-right  . Left-sided weakness    left arm due to polio  . Migraine    hx of menstrual  . Obesity   .  Osteoporosis   . Personal history of radiation therapy   . Pneumonia    hx of  . Polio    age 15 month old-"slight case"  . Stomach ulcer     Past Surgical History:  Procedure Laterality Date  . BREAST BIOPSY     right breast ER/PR +  . BREAST LUMPECTOMY  2009   R. Eston Esters   . CATARACT EXTRACTION, BILATERAL Bilateral 05/2017, 06/2017  . COLONOSCOPY    . KNEE CLOSED REDUCTION Right 11/04/2016   Procedure: CLOSED MANIPULATION RIGHT KNEE;  Surgeon: Gaynelle Arabian, MD;  Location: [facility];  Service: Orthopedics;  Laterality: Right;  . NISSEN FUNDOPLICATION  9563  . SKIN CANCER DESTRUCTION     had freezing done to remove pre-cancerpus cells to right cheek   . TONSILLECTOMY  75 years old  . TOTAL KNEE ARTHROPLASTY Right 11/15/2013   Procedure: RIGHT TOTAL KNEE ARTHROPLASTY;  Surgeon: Gearlean Alf, MD;  Location: [facility];  Service: Orthopedics;  Laterality: Right;  . TUBAL LIGATION  1976  . UPPER GASTROINTESTINAL ENDOSCOPY      Current Outpatient Medications  Medication Sig Dispense Refill  . amoxicillin (AMOXIL) 500 MG tablet Take 2,000 mg by mouth See admin instructions. 4 tabs before dental work    . Calcium Carbonate (CALCIUM 600 PO)  Take 1 tablet by mouth daily.    . cholecalciferol (VITAMIN D) 1000 UNITS tablet Take 2,000 Units by mouth daily.    . cyclobenzaprine (FLEXERIL) 5 MG tablet Take 5 mg by mouth 3 (three) times daily as needed.    . Denosumab (PROLIA [place]) Prolia    . dicyclomine (BENTYL) 10 MG capsule Take 10 mg by mouth every 8 (eight) hours as needed for spasms.    . DULoxetine (CYMBALTA) 30 MG capsule Take 30 mg by mouth at bedtime.     . fexofenadine (ALLEGRA) 180 MG tablet Take 180 mg by mouth daily as needed for allergies.    . Multiple Vitamin (MULTIVITAMIN) tablet Take 1 tablet by mouth daily.    Marland Kitchen MYRBETRIQ 25 MG TB24 tablet Take 25 mg by mouth daily.    Marland Kitchen olmesartan (BENICAR) 20 MG tablet Take 20 mg by mouth daily.    . pantoprazole (PROTONIX) 40 MG tablet  Take 40 mg by mouth 2 (two) times daily.     Current Facility-Administered Medications  Medication Dose Route Frequency Provider Last Rate Last Admin  . 0.9 %  sodium chloride infusion  500 mL Intravenous Once Armbruster, Carlota Raspberry, MD        Family History  Problem Relation Age of Onset  . Breast cancer Paternal Aunt        dx in her 70s, died in her 13s  . Cancer Maternal Uncle        NOS  . Ovarian cancer Maternal Grandmother   . Breast cancer Paternal Aunt        dx in her 18s; bilateral breast cancer; double mastectomy  . Breast cancer Cousin        bilateral breast cancer in her 28s  . Coronary artery disease Neg Hx   . Colon cancer Neg Hx   . Esophageal cancer Neg Hx   . Pancreatic cancer Neg Hx   . Stomach cancer Neg Hx   . Liver disease Neg Hx   . Colon polyps Neg Hx   . Rectal cancer Neg Hx     Review of Systems  Constitutional: Negative.   HENT: Negative.   Eyes: Negative.   Respiratory: Negative.   Cardiovascular: Negative.   Gastrointestinal: Negative.   Endocrine: Negative.   Genitourinary: Negative.   Musculoskeletal: Negative.   Skin: Negative.   Allergic/Immunologic: Negative.   Neurological: Negative.   Hematological: Negative.   Psychiatric/Behavioral: Negative.     Exam:   BP 120/64   Pulse 68   Resp 16   Ht 5' 1.5" (1.562 m)   Wt 130 lb (59 kg)   LMP 01/07/1997   BMI 24.17 kg/m   Weight change: @WEIGHTCHANGE @ Height:   Height: 5' 1.5" (156.2 cm)  Ht Readings from Last 3 Encounters:  02/04/20 5' 1.5" (1.562 m)  07/28/19 5' 1.5" (1.562 m)  01/14/19 5' 1.5" (1.562 m)    General appearance: alert, cooperative and appears stated age Head: Normocephalic, without obvious abnormality, atraumatic Neck: no adenopathy, supple, symmetrical, trachea midline and thyroid normal to inspection and palpation Breasts: normal appearance, no masses or tenderness, evidence of right breast lumpectomy Abdomen: soft, non-tender; non distended,  no masses,   no organomegaly Extremities: extremities normal, atraumatic, no cyanosis or edema Skin: Skin color, texture, turgor normal. No rashes or lesions Lymph nodes: Cervical, supraclavicular, and axillary nodes normal. No abnormal inguinal nodes palpated Neurologic: Grossly normal   Pelvic: External genitalia:  no lesions  Urethra:  normal appearing urethra with no masses, tenderness or lesions              Bartholins and Skenes: normal                 Vagina: atrophic appearing vagina with normal color and discharge, no lesions. Small grade 2 cystocele and rectocele.              Cervix: no lesions               Bimanual Exam:  Uterus:  normal size, contour, position, consistency, mobility, non-tender              Adnexa: no mass, fullness, tenderness               Rectovaginal: Confirms               Anus:  normal sphincter tone, no lesions  Royal Hawthorn chaperoned for the exam.  1. GYN exam for high-risk Medicare patient Normal exam Discussed breast self exam Discussed calcium and vit D intake   2. History of breast cancer Normal exam Mammogram UTD  3. Midline cystocele Not symptomatic   4. Rectocele Not symptomatic

## 2020-02-04 ENCOUNTER — Other Ambulatory Visit: Payer: Self-pay

## 2020-02-04 ENCOUNTER — Ambulatory Visit (INDEPENDENT_AMBULATORY_CARE_PROVIDER_SITE_OTHER): Payer: PPO | Admitting: Obstetrics and Gynecology

## 2020-02-04 ENCOUNTER — Encounter: Payer: Self-pay | Admitting: Obstetrics and Gynecology

## 2020-02-04 VITALS — BP 120/64 | HR 68 | Resp 16 | Ht 61.5 in | Wt 130.0 lb

## 2020-02-04 DIAGNOSIS — G3184 Mild cognitive impairment, so stated: Secondary | ICD-10-CM | POA: Insufficient documentation

## 2020-02-04 DIAGNOSIS — N8111 Cystocele, midline: Secondary | ICD-10-CM

## 2020-02-04 DIAGNOSIS — Z853 Personal history of malignant neoplasm of breast: Secondary | ICD-10-CM

## 2020-02-04 DIAGNOSIS — Z9189 Other specified personal risk factors, not elsewhere classified: Secondary | ICD-10-CM | POA: Diagnosis not present

## 2020-02-04 DIAGNOSIS — N1831 Chronic kidney disease, stage 3a: Secondary | ICD-10-CM | POA: Insufficient documentation

## 2020-02-04 DIAGNOSIS — G8929 Other chronic pain: Secondary | ICD-10-CM | POA: Insufficient documentation

## 2020-02-04 DIAGNOSIS — N816 Rectocele: Secondary | ICD-10-CM

## 2020-02-04 DIAGNOSIS — R42 Dizziness and giddiness: Secondary | ICD-10-CM | POA: Insufficient documentation

## 2020-02-04 NOTE — Patient Instructions (Signed)

## 2020-02-21 DIAGNOSIS — M25511 Pain in right shoulder: Secondary | ICD-10-CM | POA: Diagnosis not present

## 2020-02-21 DIAGNOSIS — M25512 Pain in left shoulder: Secondary | ICD-10-CM | POA: Diagnosis not present

## 2020-02-21 DIAGNOSIS — M545 Low back pain, unspecified: Secondary | ICD-10-CM | POA: Diagnosis not present

## 2020-02-29 DIAGNOSIS — M25512 Pain in left shoulder: Secondary | ICD-10-CM | POA: Diagnosis not present

## 2020-02-29 DIAGNOSIS — M545 Low back pain, unspecified: Secondary | ICD-10-CM | POA: Diagnosis not present

## 2020-02-29 DIAGNOSIS — M25511 Pain in right shoulder: Secondary | ICD-10-CM | POA: Diagnosis not present

## 2020-03-02 DIAGNOSIS — M545 Low back pain, unspecified: Secondary | ICD-10-CM | POA: Diagnosis not present

## 2020-03-02 DIAGNOSIS — M25512 Pain in left shoulder: Secondary | ICD-10-CM | POA: Diagnosis not present

## 2020-03-02 DIAGNOSIS — M25511 Pain in right shoulder: Secondary | ICD-10-CM | POA: Diagnosis not present

## 2020-03-10 DIAGNOSIS — L219 Seborrheic dermatitis, unspecified: Secondary | ICD-10-CM | POA: Diagnosis not present

## 2020-03-10 DIAGNOSIS — L570 Actinic keratosis: Secondary | ICD-10-CM | POA: Diagnosis not present

## 2020-03-10 DIAGNOSIS — L820 Inflamed seborrheic keratosis: Secondary | ICD-10-CM | POA: Diagnosis not present

## 2020-03-10 DIAGNOSIS — L578 Other skin changes due to chronic exposure to nonionizing radiation: Secondary | ICD-10-CM | POA: Diagnosis not present

## 2020-03-10 DIAGNOSIS — D2262 Melanocytic nevi of left upper limb, including shoulder: Secondary | ICD-10-CM | POA: Diagnosis not present

## 2020-03-24 DIAGNOSIS — M7061 Trochanteric bursitis, right hip: Secondary | ICD-10-CM | POA: Diagnosis not present

## 2020-03-24 DIAGNOSIS — M7062 Trochanteric bursitis, left hip: Secondary | ICD-10-CM | POA: Diagnosis not present

## 2020-04-14 DIAGNOSIS — G3184 Mild cognitive impairment, so stated: Secondary | ICD-10-CM | POA: Diagnosis not present

## 2020-04-14 DIAGNOSIS — L650 Telogen effluvium: Secondary | ICD-10-CM | POA: Diagnosis not present

## 2020-04-14 DIAGNOSIS — M810 Age-related osteoporosis without current pathological fracture: Secondary | ICD-10-CM | POA: Diagnosis not present

## 2020-04-14 DIAGNOSIS — F3342 Major depressive disorder, recurrent, in full remission: Secondary | ICD-10-CM | POA: Diagnosis not present

## 2020-04-17 ENCOUNTER — Other Ambulatory Visit: Payer: Self-pay | Admitting: Internal Medicine

## 2020-04-17 DIAGNOSIS — G3184 Mild cognitive impairment, so stated: Secondary | ICD-10-CM

## 2020-04-25 ENCOUNTER — Ambulatory Visit
Admission: RE | Admit: 2020-04-25 | Discharge: 2020-04-25 | Disposition: A | Discharge status: Home / Self Care | Payer: PPO | Source: Ambulatory Visit | Attending: Internal Medicine | Admitting: Internal Medicine

## 2020-04-25 DIAGNOSIS — I639 Cerebral infarction, unspecified: Secondary | ICD-10-CM | POA: Diagnosis not present

## 2020-04-25 DIAGNOSIS — G3184 Mild cognitive impairment, so stated: Secondary | ICD-10-CM

## 2020-04-25 DIAGNOSIS — Q283 Other malformations of cerebral vessels: Secondary | ICD-10-CM | POA: Diagnosis not present

## 2020-04-25 DIAGNOSIS — I6782 Cerebral ischemia: Secondary | ICD-10-CM | POA: Diagnosis not present

## 2020-04-25 DIAGNOSIS — M810 Age-related osteoporosis without current pathological fracture: Secondary | ICD-10-CM | POA: Diagnosis not present

## 2020-04-25 MED ORDER — GADOBENATE DIMEGLUMINE 529 MG/ML IV SOLN
10.0000 mL | Freq: Once | INTRAVENOUS | Status: AC | PRN
  Administered 2020-04-25: 10 mL via INTRAVENOUS

## 2020-05-15 DIAGNOSIS — M25552 Pain in left hip: Secondary | ICD-10-CM | POA: Diagnosis not present

## 2020-05-15 DIAGNOSIS — M25551 Pain in right hip: Secondary | ICD-10-CM | POA: Diagnosis not present

## 2020-05-15 DIAGNOSIS — M7062 Trochanteric bursitis, left hip: Secondary | ICD-10-CM | POA: Diagnosis not present

## 2020-05-15 DIAGNOSIS — M7061 Trochanteric bursitis, right hip: Secondary | ICD-10-CM | POA: Diagnosis not present

## 2020-05-26 DIAGNOSIS — M545 Low back pain, unspecified: Secondary | ICD-10-CM | POA: Diagnosis not present

## 2020-06-20 DIAGNOSIS — M545 Low back pain, unspecified: Secondary | ICD-10-CM | POA: Diagnosis not present

## 2020-06-20 DIAGNOSIS — M25512 Pain in left shoulder: Secondary | ICD-10-CM | POA: Diagnosis not present

## 2020-06-20 DIAGNOSIS — M25511 Pain in right shoulder: Secondary | ICD-10-CM | POA: Diagnosis not present

## 2020-08-18 DIAGNOSIS — D692 Other nonthrombocytopenic purpura: Secondary | ICD-10-CM | POA: Diagnosis not present

## 2020-08-18 DIAGNOSIS — S8011XA Contusion of right lower leg, initial encounter: Secondary | ICD-10-CM | POA: Diagnosis not present

## 2020-09-04 DIAGNOSIS — Z96651 Presence of right artificial knee joint: Secondary | ICD-10-CM | POA: Diagnosis not present

## 2020-09-05 DIAGNOSIS — M810 Age-related osteoporosis without current pathological fracture: Secondary | ICD-10-CM | POA: Diagnosis not present

## 2020-09-05 DIAGNOSIS — M8589 Other specified disorders of bone density and structure, multiple sites: Secondary | ICD-10-CM | POA: Diagnosis not present

## 2020-09-05 DIAGNOSIS — E663 Overweight: Secondary | ICD-10-CM | POA: Diagnosis not present

## 2020-09-05 DIAGNOSIS — Z6826 Body mass index (BMI) 26.0-26.9, adult: Secondary | ICD-10-CM | POA: Diagnosis not present

## 2020-09-15 DIAGNOSIS — M5416 Radiculopathy, lumbar region: Secondary | ICD-10-CM | POA: Diagnosis not present

## 2020-09-15 DIAGNOSIS — Z6825 Body mass index (BMI) 25.0-25.9, adult: Secondary | ICD-10-CM | POA: Diagnosis not present

## 2020-09-20 DIAGNOSIS — M810 Age-related osteoporosis without current pathological fracture: Secondary | ICD-10-CM | POA: Diagnosis not present

## 2020-09-20 DIAGNOSIS — E785 Hyperlipidemia, unspecified: Secondary | ICD-10-CM | POA: Diagnosis not present

## 2020-09-27 DIAGNOSIS — Z23 Encounter for immunization: Secondary | ICD-10-CM | POA: Diagnosis not present

## 2020-09-27 DIAGNOSIS — D649 Anemia, unspecified: Secondary | ICD-10-CM | POA: Diagnosis not present

## 2020-09-27 DIAGNOSIS — I700 Atherosclerosis of aorta: Secondary | ICD-10-CM | POA: Diagnosis not present

## 2020-09-27 DIAGNOSIS — M810 Age-related osteoporosis without current pathological fracture: Secondary | ICD-10-CM | POA: Diagnosis not present

## 2020-09-27 DIAGNOSIS — N1831 Chronic kidney disease, stage 3a: Secondary | ICD-10-CM | POA: Diagnosis not present

## 2020-09-27 DIAGNOSIS — I129 Hypertensive chronic kidney disease with stage 1 through stage 4 chronic kidney disease, or unspecified chronic kidney disease: Secondary | ICD-10-CM | POA: Diagnosis not present

## 2020-09-27 DIAGNOSIS — E785 Hyperlipidemia, unspecified: Secondary | ICD-10-CM | POA: Diagnosis not present

## 2020-09-27 DIAGNOSIS — M5416 Radiculopathy, lumbar region: Secondary | ICD-10-CM | POA: Diagnosis not present

## 2020-09-27 DIAGNOSIS — G3184 Mild cognitive impairment, so stated: Secondary | ICD-10-CM | POA: Diagnosis not present

## 2020-09-27 DIAGNOSIS — Z0000 Encounter for general adult medical examination without abnormal findings: Secondary | ICD-10-CM | POA: Diagnosis not present

## 2020-09-27 DIAGNOSIS — Z8673 Personal history of transient ischemic attack (TIA), and cerebral infarction without residual deficits: Secondary | ICD-10-CM | POA: Diagnosis not present

## 2020-09-27 DIAGNOSIS — R82998 Other abnormal findings in urine: Secondary | ICD-10-CM | POA: Diagnosis not present

## 2020-09-27 DIAGNOSIS — I10 Essential (primary) hypertension: Secondary | ICD-10-CM | POA: Diagnosis not present

## 2020-09-27 DIAGNOSIS — K219 Gastro-esophageal reflux disease without esophagitis: Secondary | ICD-10-CM | POA: Diagnosis not present

## 2020-09-28 DIAGNOSIS — Z7689 Persons encountering health services in other specified circumstances: Secondary | ICD-10-CM | POA: Diagnosis not present

## 2020-09-28 DIAGNOSIS — M5416 Radiculopathy, lumbar region: Secondary | ICD-10-CM | POA: Diagnosis not present

## 2020-09-28 DIAGNOSIS — M545 Low back pain, unspecified: Secondary | ICD-10-CM | POA: Diagnosis not present

## 2020-09-29 DIAGNOSIS — M7062 Trochanteric bursitis, left hip: Secondary | ICD-10-CM | POA: Diagnosis not present

## 2020-09-29 DIAGNOSIS — M7061 Trochanteric bursitis, right hip: Secondary | ICD-10-CM | POA: Diagnosis not present

## 2020-10-09 DIAGNOSIS — M25511 Pain in right shoulder: Secondary | ICD-10-CM | POA: Diagnosis not present

## 2020-10-09 DIAGNOSIS — M545 Low back pain, unspecified: Secondary | ICD-10-CM | POA: Diagnosis not present

## 2020-10-09 DIAGNOSIS — M5416 Radiculopathy, lumbar region: Secondary | ICD-10-CM | POA: Diagnosis not present

## 2020-10-09 DIAGNOSIS — M25512 Pain in left shoulder: Secondary | ICD-10-CM | POA: Diagnosis not present

## 2020-10-11 DIAGNOSIS — M5416 Radiculopathy, lumbar region: Secondary | ICD-10-CM | POA: Diagnosis not present

## 2020-10-18 ENCOUNTER — Other Ambulatory Visit: Payer: Self-pay | Admitting: Obstetrics and Gynecology

## 2020-10-18 DIAGNOSIS — Z1231 Encounter for screening mammogram for malignant neoplasm of breast: Secondary | ICD-10-CM

## 2020-10-19 DIAGNOSIS — M545 Low back pain, unspecified: Secondary | ICD-10-CM | POA: Diagnosis not present

## 2020-10-19 DIAGNOSIS — M25511 Pain in right shoulder: Secondary | ICD-10-CM | POA: Diagnosis not present

## 2020-10-19 DIAGNOSIS — M25512 Pain in left shoulder: Secondary | ICD-10-CM | POA: Diagnosis not present

## 2020-10-19 DIAGNOSIS — M5416 Radiculopathy, lumbar region: Secondary | ICD-10-CM | POA: Diagnosis not present

## 2020-10-24 DIAGNOSIS — M25512 Pain in left shoulder: Secondary | ICD-10-CM | POA: Diagnosis not present

## 2020-10-24 DIAGNOSIS — M5116 Intervertebral disc disorders with radiculopathy, lumbar region: Secondary | ICD-10-CM | POA: Diagnosis not present

## 2020-10-24 DIAGNOSIS — M545 Low back pain, unspecified: Secondary | ICD-10-CM | POA: Diagnosis not present

## 2020-10-24 DIAGNOSIS — M25511 Pain in right shoulder: Secondary | ICD-10-CM | POA: Diagnosis not present

## 2020-10-24 DIAGNOSIS — M5416 Radiculopathy, lumbar region: Secondary | ICD-10-CM | POA: Diagnosis not present

## 2020-10-25 DIAGNOSIS — M810 Age-related osteoporosis without current pathological fracture: Secondary | ICD-10-CM | POA: Diagnosis not present

## 2020-11-01 DIAGNOSIS — M25511 Pain in right shoulder: Secondary | ICD-10-CM | POA: Diagnosis not present

## 2020-11-01 DIAGNOSIS — M545 Low back pain, unspecified: Secondary | ICD-10-CM | POA: Diagnosis not present

## 2020-11-01 DIAGNOSIS — M5416 Radiculopathy, lumbar region: Secondary | ICD-10-CM | POA: Diagnosis not present

## 2020-11-01 DIAGNOSIS — M25512 Pain in left shoulder: Secondary | ICD-10-CM | POA: Diagnosis not present

## 2020-11-15 DIAGNOSIS — M545 Low back pain, unspecified: Secondary | ICD-10-CM | POA: Diagnosis not present

## 2020-11-15 DIAGNOSIS — M5416 Radiculopathy, lumbar region: Secondary | ICD-10-CM | POA: Diagnosis not present

## 2020-11-15 DIAGNOSIS — M25511 Pain in right shoulder: Secondary | ICD-10-CM | POA: Diagnosis not present

## 2020-11-15 DIAGNOSIS — M25512 Pain in left shoulder: Secondary | ICD-10-CM | POA: Diagnosis not present

## 2020-11-23 DIAGNOSIS — M5416 Radiculopathy, lumbar region: Secondary | ICD-10-CM | POA: Diagnosis not present

## 2020-11-23 DIAGNOSIS — M545 Low back pain, unspecified: Secondary | ICD-10-CM | POA: Diagnosis not present

## 2020-11-23 DIAGNOSIS — M25512 Pain in left shoulder: Secondary | ICD-10-CM | POA: Diagnosis not present

## 2020-11-23 DIAGNOSIS — M25511 Pain in right shoulder: Secondary | ICD-10-CM | POA: Diagnosis not present

## 2020-12-04 ENCOUNTER — Ambulatory Visit: Payer: PPO

## 2020-12-04 ENCOUNTER — Ambulatory Visit
Admission: RE | Admit: 2020-12-04 | Discharge: 2020-12-04 | Disposition: A | Discharge status: Home / Self Care | Payer: PPO | Source: Ambulatory Visit | Attending: Obstetrics and Gynecology | Admitting: Obstetrics and Gynecology

## 2020-12-04 DIAGNOSIS — Z1231 Encounter for screening mammogram for malignant neoplasm of breast: Secondary | ICD-10-CM

## 2020-12-06 DIAGNOSIS — E785 Hyperlipidemia, unspecified: Secondary | ICD-10-CM | POA: Diagnosis not present

## 2020-12-06 DIAGNOSIS — N1831 Chronic kidney disease, stage 3a: Secondary | ICD-10-CM | POA: Diagnosis not present

## 2020-12-06 DIAGNOSIS — I10 Essential (primary) hypertension: Secondary | ICD-10-CM | POA: Diagnosis not present

## 2020-12-06 DIAGNOSIS — M810 Age-related osteoporosis without current pathological fracture: Secondary | ICD-10-CM | POA: Diagnosis not present

## 2020-12-07 DIAGNOSIS — M25512 Pain in left shoulder: Secondary | ICD-10-CM | POA: Diagnosis not present

## 2020-12-07 DIAGNOSIS — M25511 Pain in right shoulder: Secondary | ICD-10-CM | POA: Diagnosis not present

## 2020-12-07 DIAGNOSIS — M5416 Radiculopathy, lumbar region: Secondary | ICD-10-CM | POA: Diagnosis not present

## 2020-12-07 DIAGNOSIS — M545 Low back pain, unspecified: Secondary | ICD-10-CM | POA: Diagnosis not present

## 2020-12-19 ENCOUNTER — Encounter: Payer: Self-pay | Admitting: Gastroenterology

## 2020-12-26 DIAGNOSIS — R319 Hematuria, unspecified: Secondary | ICD-10-CM | POA: Diagnosis not present

## 2020-12-26 DIAGNOSIS — N390 Urinary tract infection, site not specified: Secondary | ICD-10-CM | POA: Diagnosis not present

## 2020-12-26 DIAGNOSIS — M545 Low back pain, unspecified: Secondary | ICD-10-CM | POA: Diagnosis not present

## 2020-12-26 DIAGNOSIS — K8020 Calculus of gallbladder without cholecystitis without obstruction: Secondary | ICD-10-CM | POA: Diagnosis not present

## 2020-12-26 DIAGNOSIS — G3184 Mild cognitive impairment, so stated: Secondary | ICD-10-CM | POA: Diagnosis not present

## 2020-12-26 DIAGNOSIS — K219 Gastro-esophageal reflux disease without esophagitis: Secondary | ICD-10-CM | POA: Diagnosis not present

## 2020-12-26 DIAGNOSIS — R109 Unspecified abdominal pain: Secondary | ICD-10-CM | POA: Diagnosis not present

## 2021-01-05 DIAGNOSIS — E785 Hyperlipidemia, unspecified: Secondary | ICD-10-CM | POA: Diagnosis not present

## 2021-01-05 DIAGNOSIS — M810 Age-related osteoporosis without current pathological fracture: Secondary | ICD-10-CM | POA: Diagnosis not present

## 2021-01-05 DIAGNOSIS — N1831 Chronic kidney disease, stage 3a: Secondary | ICD-10-CM | POA: Diagnosis not present

## 2021-01-05 DIAGNOSIS — I10 Essential (primary) hypertension: Secondary | ICD-10-CM | POA: Diagnosis not present

## 2021-01-12 DIAGNOSIS — M7062 Trochanteric bursitis, left hip: Secondary | ICD-10-CM | POA: Diagnosis not present

## 2021-01-12 DIAGNOSIS — M7061 Trochanteric bursitis, right hip: Secondary | ICD-10-CM | POA: Diagnosis not present

## 2021-01-22 DIAGNOSIS — N390 Urinary tract infection, site not specified: Secondary | ICD-10-CM | POA: Diagnosis not present

## 2021-01-22 DIAGNOSIS — R109 Unspecified abdominal pain: Secondary | ICD-10-CM | POA: Diagnosis not present

## 2021-01-24 ENCOUNTER — Other Ambulatory Visit: Payer: Self-pay

## 2021-01-24 ENCOUNTER — Ambulatory Visit (AMBULATORY_SURGERY_CENTER): Payer: PPO

## 2021-01-24 VITALS — Ht 62.0 in | Wt 135.0 lb

## 2021-01-24 DIAGNOSIS — K2270 Barrett's esophagus without dysplasia: Secondary | ICD-10-CM

## 2021-01-24 NOTE — Progress Notes (Signed)
No egg or soy allergy known to patient  No issues known to pt with past sedation with any surgeries or procedures Patient denies ever being told they had issues or difficulty with intubation  No FH of Malignant Hyperthermia Pt is not on diet pills Pt is not on home 02  Pt is not on blood thinners  Pt denies issues with constipation - "sometimes but generally not"; patient advised to increase po fluids, activity, fruits/veggies, take stool softeners/laxative No A fib or A flutter Pt is fully vaccinated for Covid x 2 +boosters;  Pt aware of COVID protocols and [facility] guidelines  PV completed over the phone. Pt verified name, DOB, address and insurance during PV today.  Pt mailed instruction packet with copy of consent form to read and not return, and instructions.  Pt encouraged to call with questions or issues.  If pt has My chart, procedure instructions sent via My Chart   Written on paper instructions- patient to stop IRON 5 days prior to EGD; Patient verbalized understanding of information/instructions;

## 2021-01-30 ENCOUNTER — Encounter: Payer: Self-pay | Admitting: Gastroenterology

## 2021-02-05 ENCOUNTER — Other Ambulatory Visit: Payer: Self-pay

## 2021-02-05 ENCOUNTER — Encounter: Payer: Self-pay | Admitting: Gastroenterology

## 2021-02-05 ENCOUNTER — Ambulatory Visit (AMBULATORY_SURGERY_CENTER): Payer: PPO | Admitting: Gastroenterology

## 2021-02-05 VITALS — BP 164/72 | HR 65 | Temp 98.9°F | Resp 19 | Ht 62.0 in | Wt 135.0 lb

## 2021-02-05 DIAGNOSIS — K2270 Barrett's esophagus without dysplasia: Secondary | ICD-10-CM

## 2021-02-05 DIAGNOSIS — K317 Polyp of stomach and duodenum: Secondary | ICD-10-CM

## 2021-02-05 DIAGNOSIS — K449 Diaphragmatic hernia without obstruction or gangrene: Secondary | ICD-10-CM

## 2021-02-05 DIAGNOSIS — K219 Gastro-esophageal reflux disease without esophagitis: Secondary | ICD-10-CM | POA: Diagnosis not present

## 2021-02-05 DIAGNOSIS — I10 Essential (primary) hypertension: Secondary | ICD-10-CM | POA: Diagnosis not present

## 2021-02-05 MED ORDER — SODIUM CHLORIDE 0.9 % IV SOLN: 500.0000 mL | Freq: Once | INTRAVENOUS | Status: DC

## 2021-02-05 NOTE — Progress Notes (Signed)
Called to room to assist during endoscopic procedure.  Patient ID and intended procedure confirmed with present staff. Received instructions for my participation in the procedure from the performing physician.  

## 2021-02-05 NOTE — Progress Notes (Signed)
Report to PACU, RN, vss, BBS= Clear.  

## 2021-02-05 NOTE — Patient Instructions (Addendum)
Handout on hiatal hernia provided   Await pathology results. Assuming no dysplastic change on biopsies, the patient would not need surveillance for upwards of 5 years, at which time she will be 76 years old. Idon't feel strongly the patient needs another surveillance endoscopy in this light, but will await pathology results.  YOU HAD AN ENDOSCOPIC PROCEDURE TODAY AT [facility]:   Refer to the procedure report that was given to you for any specific questions about what was found during the examination.  If the procedure report does not answer your questions, please call your gastroenterologist to clarify.  If you requested that your care partner not be given the details of your procedure findings, then the procedure report has been included in a sealed envelope for you to review at your convenience later.  YOU SHOULD EXPECT: Some feelings of bloating in the abdomen. Passage of more gas than usual.  Walking can help get rid of the air that was put into your GI tract during the procedure and reduce the bloating. If you had a lower endoscopy (such as a colonoscopy or flexible sigmoidoscopy) you may notice spotting of blood in your stool or on the toilet paper. If you underwent a bowel prep for your procedure, you may not have a normal bowel movement for a few days.  Please Note:  You might notice some irritation and congestion in your nose or some drainage.  This is from the oxygen used during your procedure.  There is no need for concern and it should clear up in a day or so.  SYMPTOMS TO REPORT IMMEDIATELY: Following upper endoscopy (EGD)  Vomiting of blood or coffee ground material  New chest pain or pain under the shoulder blades  Painful or persistently difficult swallowing  New shortness of breath  Fever of 100F or higher  Black, tarry-looking stools  For urgent or emergent issues, a gastroenterologist can be reached at any hour by calling [phone]. Do not use MyChart  messaging for urgent concerns.    DIET:  We do recommend a small meal at first, but then you may proceed to your regular diet.  Drink plenty of fluids but you should avoid alcoholic beverages for 24 hours.  ACTIVITY:  You should plan to take it easy for the rest of today and you should NOT DRIVE or use heavy machinery until tomorrow (because of the sedation medicines used during the test).    FOLLOW UP: Our staff will call the number listed on your records 48-72 hours following your procedure to check on you and address any questions or concerns that you may have regarding the information given to you following your procedure. If we do not reach you, we will leave a message.  We will attempt to reach you two times.  During this call, we will ask if you have developed any symptoms of COVID 19. If you develop any symptoms (ie: fever, flu-like symptoms, shortness of breath, cough etc.) before then, please call [phone].  If you test positive for Covid 19 in the 2 weeks post procedure, please call and report this information to [place].    If any biopsies were taken you will be contacted by phone or by letter within the next 1-3 weeks.  Please call us at [phone] if you have not heard about the biopsies in 3 weeks.    SIGNATURES/CONFIDENTIALITY: You and/or your care partner have signed paperwork which will be entered into your electronic medical record.  These signatures attest to the fact that that the information above on your After Visit Summary has been reviewed and is understood.  Full responsibility of the confidentiality of this discharge information lies with you and/or your care-partner.

## 2021-02-05 NOTE — Progress Notes (Signed)
C.W. vital signs. 

## 2021-02-05 NOTE — Progress Notes (Deleted)
76 y.o. G3P3 Married White or Caucasian Not Hispanic or Latino female here for annual exam.      Patient's last menstrual period was 01/07/1997.          Sexually active: {yes no:314532}  The current method of family planning is {contraception:315051}.    Exercising: {yes no:314532}  {types:19826} Smoker:  {YES NO:22349}  Health Maintenance: Pap:  12-08-15 neg HPV HR neg History of abnormal Pap:  no MMG:  12/05/20 density B Bi-rads 1 neg  BMD:    06-06-17 osteopenia, FRAX 18/3.4% Colonoscopy: 08-15-15 normal TDaP:  2019  Gardasil: n/a   reports that she has never smoked. She has never used smokeless tobacco. She reports that she does not currently use alcohol. She reports that she does not use drugs.  Past Medical History:  Diagnosis Date   Allergic rhinoconjunctivitis    Anemia    on meds   Arthritis    on med   Asthma    very mild   Breast cancer ([place])    RIGHT =s/p XRT and lumpectomy 2009   Cataract    bilateral sx   Depression    on meds   Environmental allergies    Environmental and seasonal allergies    Family history of breast cancer    Family history of ovarian cancer    GERD (gastroesophageal reflux disease)    Heart murmur    slight   Hyperlipidemia    on meds   Hypertension    on meds   Infertility    took clomid with second pregnancy   Knee injury 06/13/2012   fell, cracked femur under knee cap-right   Left-sided weakness    left arm due to polio   Migraine    hx of menstrual   Obesity    Osteoporosis    on meds   Personal history of radiation therapy    Pneumonia    hx of   Polio    age 81 month old-"slight case"   Seasonal allergies    Stomach ulcer    Stroke ([place])    small stroke per pt per Dr.Shaw - dx 2021    Past Surgical History:  Procedure Laterality Date   BREAST BIOPSY     right breast ER/PR +   BREAST LUMPECTOMY  2009   R. Eston Esters    CATARACT EXTRACTION, BILATERAL Bilateral 05/2017, 06/2017   COLONOSCOPY  2017   KNEE  CLOSED REDUCTION Right 11/04/2016   Procedure: CLOSED MANIPULATION RIGHT KNEE;  Surgeon: Gaynelle Arabian, MD;  Location: [facility];  Service: Orthopedics;  Laterality: Right;   NISSEN FUNDOPLICATION  8676   SKIN CANCER DESTRUCTION     had freezing done to remove pre-cancerpus cells to right cheek    TONSILLECTOMY  76 years old   TOTAL KNEE ARTHROPLASTY Right 11/15/2013   Procedure: RIGHT TOTAL KNEE ARTHROPLASTY;  Surgeon: Gearlean Alf, MD;  Location: [facility];  Service: Orthopedics;  Laterality: Right;   TUBAL LIGATION  1976   UPPER GASTROINTESTINAL ENDOSCOPY  2019   recall 3 yrs   WISDOM TOOTH EXTRACTION      Current Outpatient Medications  Medication Sig Dispense Refill   amoxicillin (AMOXIL) 500 MG tablet Take 2,000 mg by mouth See admin instructions. 4 tabs before dental work     cyclobenzaprine (FLEXERIL) 5 MG tablet Take 5 mg by mouth 3 (three) times daily as needed. (Patient not taking: Reported on 01/24/2021)     denosumab (PROLIA) 60 MG/ML SOSY  injection Inject 1 mL into the skin every 6 (six) months.     dicyclomine (BENTYL) 10 MG capsule Take 10 mg by mouth every 8 (eight) hours as needed for spasms.     donepezil (ARICEPT) 10 MG tablet      DULoxetine (CYMBALTA) 60 MG capsule Take 60 mg by mouth daily.     fexofenadine (ALLEGRA) 180 MG tablet Take 180 mg by mouth daily as needed for allergies.     hydrochlorothiazide (MICROZIDE) 12.5 MG capsule      iron polysaccharides (NIFEREX) 150 MG capsule Take 1 capsule by mouth daily.     ketoconazole (NIZORAL) 2 % cream ketoconazole 2 % topical cream     loratadine (CLARITIN) 10 MG tablet Take 1 tablet by mouth daily.     Multiple Vitamin (MULTIVITAMIN) tablet Take 1 tablet by mouth daily.     MYRBETRIQ 25 MG TB24 tablet Take 25 mg by mouth daily.     olmesartan (BENICAR) 20 MG tablet Take 20 mg by mouth daily.     pantoprazole (PROTONIX) 40 MG tablet Take 40 mg by mouth 2 (two) times daily.     rosuvastatin (CRESTOR) 10 MG tablet Take  10 mg by mouth at bedtime.     zolpidem (AMBIEN) 10 MG tablet Take 1 tablet by mouth at bedtime as needed.     Current Facility-Administered Medications  Medication Dose Route Frequency Provider Last Rate Last Admin   0.9 %  sodium chloride infusion  500 mL Intravenous Once Armbruster, Carlota Raspberry, MD       0.9 %  sodium chloride infusion  500 mL Intravenous Once Armbruster, Carlota Raspberry, MD        Family History  Problem Relation Age of Onset   Breast cancer Paternal Aunt        dx in her 53s, died in her 72s   Cancer Maternal Uncle        NOS   Ovarian cancer Maternal Grandmother    Breast cancer Paternal Aunt        dx in her 60s; bilateral breast cancer; double mastectomy   Breast cancer Cousin        bilateral breast cancer in her 65s   Coronary artery disease Neg Hx    Colon cancer Neg Hx    Esophageal cancer Neg Hx    Pancreatic cancer Neg Hx    Stomach cancer Neg Hx    Liver disease Neg Hx    Colon polyps Neg Hx    Rectal cancer Neg Hx     Review of Systems  Exam:   LMP 01/07/1997   Weight change: @WEIGHTCHANGE @ Height:      Ht Readings from Last 3 Encounters:  02/05/21 5\' 2"  (1.575 m)  01/24/21 5\' 2"  (1.575 m)  02/04/20 5' 1.5" (1.562 m)    General appearance: alert, cooperative and appears stated age Head: Normocephalic, without obvious abnormality, atraumatic Neck: no adenopathy, supple, symmetrical, trachea midline and thyroid {CHL AMB PHY EX THYROID NORM [phone]::"normal to inspection and palpation"} Lungs: clear to auscultation bilaterally Cardiovascular: regular rate and rhythm Breasts: {Exam; breast:13139::"normal appearance, no masses or tenderness"} Abdomen: soft, non-tender; non distended,  no masses,  no organomegaly Extremities: extremities normal, atraumatic, no cyanosis or edema Skin: Skin color, texture, turgor normal. No rashes or lesions Lymph nodes: Cervical, supraclavicular, and axillary nodes normal. No abnormal inguinal nodes  palpated Neurologic: Grossly normal   Pelvic: External genitalia:  no lesions  Urethra:  normal appearing urethra with no masses, tenderness or lesions              Bartholins and Skenes: normal                 Vagina: normal appearing vagina with normal color and discharge, no lesions              Cervix: {CHL AMB PHY EX CERVIX NORM DEFAULT:[phone]::"no lesions"}               Bimanual Exam:  Uterus:  {CHL AMB PHY EX UTERUS NORM DEFAULT:[phone]::"normal size, contour, position, consistency, mobility, non-tender"}              Adnexa: {CHL AMB PHY EX ADNEXA NO MASS DEFAULT:[phone]::"no mass, fullness, tenderness"}               Rectovaginal: Confirms               Anus:  normal sphincter tone, no lesions  *** chaperoned for the exam.  A:  Well Woman with normal exam  P:

## 2021-02-05 NOTE — Progress Notes (Signed)
Pt's states no medical or surgical changes since previsit or office visit. 

## 2021-02-05 NOTE — Op Note (Signed)
[facility] Patient Name: Kelsey Mueller Procedure Date: 02/05/2021 3:36 PM MRN: [id number] Endoscopist: Remo Lipps P. Havery Moros , MD Age: 76 Referring MD:  Date of Birth: [birth year] Gender: Female Account #: [id number] Procedure:                Upper GI endoscopy Indications:              Follow-up of Barrett's esophagus - short segment                            noted 12/2017, on protonix 40mg  once daily with                            good control of symptoms. History of peptic ulcer                            in 2019. History of Nissen fundoplication remotely,                            hiatal hernia Medicines:                Monitored Anesthesia Care Procedure:                Pre-Anesthesia Assessment:                           - Prior to the procedure, a History and Physical                            was performed, and patient medications and                            allergies were reviewed. The patient's tolerance of                            previous anesthesia was also reviewed. The risks                            and benefits of the procedure and the sedation                            options and risks were discussed with the patient.                            All questions were answered, and informed consent                            was obtained. Prior Anticoagulants: The patient has                            taken no previous anticoagulant or antiplatelet                            agents. ASA Grade Assessment: II - A patient with  mild systemic disease. After reviewing the risks                            and benefits, the patient was deemed in                            satisfactory condition to undergo the procedure.                           After obtaining informed consent, the endoscope was                            passed under direct vision. Throughout the                            procedure, the patient's blood pressure,  pulse, and                            oxygen saturations were monitored continuously. The                            GIF [id number] #[id number] was introduced through the                            mouth, and advanced to the second part of duodenum.                            The upper GI endoscopy was accomplished without                            difficulty. The patient tolerated the procedure                            well. Scope In: Scope Out: Findings:                 Esophagogastric landmarks were identified: the                            Z-line was found at 32 cm, the gastroesophageal                            junction was found at 32 cm and the upper extent of                            the gastric folds was found at 38 cm from the                            incisors.                           A 6 cm hiatal hernia was present.                           The Z-line was irregular  and was found 32 cm from                            the incisors, with a roughly 1cm tongue of salmon                            colored tissue with a few small islands. Difficult                            to insufflate the area well due to hiatal hernia.                            Biopsies were taken with a cold forceps for                            histology. Overall the area appeared stable                            compared to prior exam.                           A few diminutive sessile polyps were found in the                            gastric body, grossly consistent with benign fundic                            gland polyps.                           Evidence of a Nissen fundoplication was found in                            the cardia. The wrap appeared quite loose.                           The exam of the stomach was otherwise normal.                            Interval healing of prior gastric ulcers.                           The duodenal bulb and second portion of the                             duodenum were normal. Complications:            No immediate complications. Estimated blood loss:                            Minimal. Estimated Blood Loss:     Estimated blood loss was minimal. Impression:               - Esophagogastric landmarks identified.                           -  6 cm hiatal hernia.                           - Z-line irregular, 32 cm from the incisors.                            Biopsied.                           - Loose Nissen fundoplication                           - A few benign fundic gland polyps.                           - Normal stomach otherwise                           - Normal duodenal bulb and second portion of the                            duodenum.                           Overall, low risk lesion of Barrett's in the                            setting of large hiatal hernia. Symptoms controlled                            with protonix. Recommendation:           - Patient has a contact number available for                            emergencies. The signs and symptoms of potential                            delayed complications were discussed with the                            patient. Return to normal activities tomorrow.                            Written discharge instructions were provided to the                            patient.                           - Resume previous diet.                           - Continue present medications.                           - Await pathology results. Assuming no dysplastic  change on biopsies, the patient would not need                            surveillance for upwards of 5 years, at which time                            she will be 76 years old. I don't feel strongly the                            patient needs another surveillance endoscopy in                            this light, but will await pathology results. Remo Lipps P. Zafar Debrosse, MD 02/05/2021 4:10:44 PM This  report has been signed electronically.

## 2021-02-05 NOTE — Progress Notes (Signed)
[facility] History and Physical   Primary Care Physician:  Ginger Organ., MD   Reason for Procedure:   Barrett's esophagus, reflux  Plan:    EGD     HPI: Kelsey Mueller is a 76 y.o. female  here for surveillance EGD to evaluate short segment BE. History of Nissen, large hiatal hernia, last EGD 12/2017 showed a short segment BE and gastric ulcer. On protonix 40mg  / day and doing well. Patient denies any bowel symptoms at this time. Otherwise feels well without any cardiopulmonary symptoms.    Past Medical History:  Diagnosis Date   Allergic rhinoconjunctivitis    Anemia    on meds   Arthritis    on med   Asthma    very mild   Breast cancer ([place])    RIGHT =s/p XRT and lumpectomy 2009   Cataract    bilateral sx   Depression    on meds   Environmental allergies    Environmental and seasonal allergies    Family history of breast cancer    Family history of ovarian cancer    GERD (gastroesophageal reflux disease)    Heart murmur    slight   Hyperlipidemia    on meds   Hypertension    on meds   Infertility    took clomid with second pregnancy   Knee injury 06/13/2012   fell, cracked femur under knee cap-right   Left-sided weakness    left arm due to polio   Migraine    hx of menstrual   Obesity    Osteoporosis    on meds   Personal history of radiation therapy    Pneumonia    hx of   Polio    age 57 month old-"slight case"   Seasonal allergies    Stomach ulcer    Stroke ([place])    small stroke per pt per Dr.Shaw - dx 2021    Past Surgical History:  Procedure Laterality Date   BREAST BIOPSY     right breast ER/PR +   BREAST LUMPECTOMY  2009   R. Eston Esters    CATARACT EXTRACTION, BILATERAL Bilateral 05/2017, 06/2017   COLONOSCOPY  2017   KNEE CLOSED REDUCTION Right 11/04/2016   Procedure: CLOSED MANIPULATION RIGHT KNEE;  Surgeon: Gaynelle Arabian, MD;  Location: [facility];  Service: Orthopedics;  Laterality: Right;   NISSEN FUNDOPLICATION   7741   SKIN CANCER DESTRUCTION     had freezing done to remove pre-cancerpus cells to right cheek    TONSILLECTOMY  76 years old   TOTAL KNEE ARTHROPLASTY Right 11/15/2013   Procedure: RIGHT TOTAL KNEE ARTHROPLASTY;  Surgeon: Gearlean Alf, MD;  Location: [facility];  Service: Orthopedics;  Laterality: Right;   TUBAL LIGATION  1976   UPPER GASTROINTESTINAL ENDOSCOPY  2019   recall 3 yrs   WISDOM TOOTH EXTRACTION      Prior to Admission medications   Medication Sig Start Date End Date Taking? Authorizing Provider  donepezil (ARICEPT) 10 MG tablet    Yes [provider]  DULoxetine (CYMBALTA) 60 MG capsule Take 60 mg by mouth daily. 08/08/20  Yes [provider]  hydrochlorothiazide (MICROZIDE) 12.5 MG capsule    Yes [provider]  Multiple Vitamin (MULTIVITAMIN) tablet Take 1 tablet by mouth daily.   Yes [provider]  MYRBETRIQ 25 MG TB24 tablet Take 25 mg by mouth daily. 01/10/20  Yes [provider]  olmesartan (BENICAR) 20 MG tablet  Take 20 mg by mouth daily. 01/14/20  Yes [provider]  pantoprazole (PROTONIX) 40 MG tablet Take 40 mg by mouth 2 (two) times daily. 01/29/20  Yes [provider]  rosuvastatin (CRESTOR) 10 MG tablet Take 10 mg by mouth at bedtime. 11/30/20  Yes [provider]  amoxicillin (AMOXIL) 500 MG tablet Take 2,000 mg by mouth See admin instructions. 4 tabs before dental work    [provider]  cyclobenzaprine (FLEXERIL) 5 MG tablet Take 5 mg by mouth 3 (three) times daily as needed. Patient not taking: Reported on 01/24/2021 01/27/20   [provider]  denosumab (PROLIA) 60 MG/ML SOSY injection Inject 1 mL into the skin every 6 (six) months.    [provider]  dicyclomine (BENTYL) 10 MG capsule Take 10 mg by mouth every 8 (eight) hours as needed for spasms.    [provider]  fexofenadine (ALLEGRA) 180 MG tablet Take 180 mg by mouth daily as needed for  allergies.    [provider]  iron polysaccharides (NIFEREX) 150 MG capsule Take 1 capsule by mouth daily. 04/18/20   [provider]  ketoconazole (NIZORAL) 2 % cream ketoconazole 2 % topical cream    [provider]  loratadine (CLARITIN) 10 MG tablet Take 1 tablet by mouth daily.    [provider]  zolpidem (AMBIEN) 10 MG tablet Take 1 tablet by mouth at bedtime as needed. 04/07/09   [provider]    Current Outpatient Medications  Medication Sig Dispense Refill   donepezil (ARICEPT) 10 MG tablet      DULoxetine (CYMBALTA) 60 MG capsule Take 60 mg by mouth daily.     hydrochlorothiazide (MICROZIDE) 12.5 MG capsule      Multiple Vitamin (MULTIVITAMIN) tablet Take 1 tablet by mouth daily.     MYRBETRIQ 25 MG TB24 tablet Take 25 mg by mouth daily.     olmesartan (BENICAR) 20 MG tablet Take 20 mg by mouth daily.     pantoprazole (PROTONIX) 40 MG tablet Take 40 mg by mouth 2 (two) times daily.     rosuvastatin (CRESTOR) 10 MG tablet Take 10 mg by mouth at bedtime.     amoxicillin (AMOXIL) 500 MG tablet Take 2,000 mg by mouth See admin instructions. 4 tabs before dental work     cyclobenzaprine (FLEXERIL) 5 MG tablet Take 5 mg by mouth 3 (three) times daily as needed. (Patient not taking: Reported on 01/24/2021)     denosumab (PROLIA) 60 MG/ML SOSY injection Inject 1 mL into the skin every 6 (six) months.     dicyclomine (BENTYL) 10 MG capsule Take 10 mg by mouth every 8 (eight) hours as needed for spasms.     fexofenadine (ALLEGRA) 180 MG tablet Take 180 mg by mouth daily as needed for allergies.     iron polysaccharides (NIFEREX) 150 MG capsule Take 1 capsule by mouth daily.     ketoconazole (NIZORAL) 2 % cream ketoconazole 2 % topical cream     loratadine (CLARITIN) 10 MG tablet Take 1 tablet by mouth daily.     zolpidem (AMBIEN) 10 MG tablet Take 1 tablet by mouth at bedtime as needed.     Current Facility-Administered Medications  Medication  Dose Route Frequency Provider Last Rate Last Admin   0.9 %  sodium chloride infusion  500 mL Intravenous Once Karas Pickerill, Carlota Raspberry, MD       0.9 %  sodium chloride infusion  500 mL Intravenous Once Dupuyer Cellar  P, MD        Allergies as of 02/05/2021   (No Known Allergies)    Family History  Problem Relation Age of Onset   Breast cancer Paternal Aunt        dx in her 61s, died in her 37s   Cancer Maternal Uncle        NOS   Ovarian cancer Maternal Grandmother    Breast cancer Paternal Aunt        dx in her 61s; bilateral breast cancer; double mastectomy   Breast cancer Cousin        bilateral breast cancer in her 48s   Coronary artery disease Neg Hx    Colon cancer Neg Hx    Esophageal cancer Neg Hx    Pancreatic cancer Neg Hx    Stomach cancer Neg Hx    Liver disease Neg Hx    Colon polyps Neg Hx    Rectal cancer Neg Hx     Social History   Socioeconomic History   Marital status: Married    Spouse name: Not on file   Number of children: 3   Years of education: Not on file   Highest education level: Not on file  Occupational History   Occupation: retired  Tobacco Use   Smoking status: Never   Smokeless tobacco: Never  Vaping Use   Vaping Use: Never used  Substance and Sexual Activity   Alcohol use: Not Currently   Drug use: No   Sexual activity: Yes    Partners: Male    Birth control/protection: Post-menopausal  Other Topics Concern   Not on file  Social History Narrative   Married. Retired Pharmacist, hospital Therapist, art)    Social Determinants of Radio broadcast assistant Strain: Not on file  Food Insecurity: Not on file  Transportation Needs: Not on file  Physical Activity: Not on file  Stress: Not on file  Social Connections: Not on file  Intimate Partner Violence: Not on file    Review of Systems: All other review of systems negative except as mentioned in the HPI.  Physical Exam: Vital signs BP (!) 197/83    Pulse (!) 54    Temp 98.9 F  (37.2 C)    Resp 14    Ht 5\' 2"  (1.575 m)    Wt 135 lb (61.2 kg)    LMP 01/07/1997    SpO2 98%    BMI 24.69 kg/m   General:   Alert,  Well-developed, pleasant and cooperative in NAD Lungs:  Clear throughout to auscultation.   Heart:  Regular rate and rhythm Abdomen:  Soft, nontender and nondistended.   Neuro/Psych:  Alert and cooperative. Normal mood and affect. A and O x 3  Jolly Mango, MD [facility]

## 2021-02-06 ENCOUNTER — Ambulatory Visit: Payer: Self-pay | Admitting: Obstetrics and Gynecology

## 2021-02-06 DIAGNOSIS — Z0289 Encounter for other administrative examinations: Secondary | ICD-10-CM

## 2021-02-07 ENCOUNTER — Telehealth: Payer: Self-pay | Admitting: *Deleted

## 2021-02-07 DIAGNOSIS — N390 Urinary tract infection, site not specified: Secondary | ICD-10-CM | POA: Diagnosis not present

## 2021-02-07 DIAGNOSIS — R1011 Right upper quadrant pain: Secondary | ICD-10-CM | POA: Diagnosis not present

## 2021-02-07 DIAGNOSIS — R3129 Other microscopic hematuria: Secondary | ICD-10-CM | POA: Diagnosis not present

## 2021-02-07 NOTE — Telephone Encounter (Signed)
°  Follow up Call-  Call back number 02/05/2021  Post procedure Call Back phone  # [phone]  Permission to leave phone message Yes  Some recent data might be hidden     Patient questions:  Do you have a fever, pain , or abdominal swelling? No. Pain Score  0 *  Have you tolerated food without any problems? Yes.    Have you been able to return to your normal activities? Yes.    Do you have any questions about your discharge instructions: Diet   No. Medications  No. Follow up visit  No.  Do you have questions or concerns about your Care? No.  Actions: * If pain score is 4 or above: No action needed, pain <4.

## 2021-02-07 NOTE — Telephone Encounter (Signed)
First follow up call attempt.  LVM. 

## 2021-02-09 ENCOUNTER — Other Ambulatory Visit: Payer: Self-pay | Admitting: Internal Medicine

## 2021-02-09 DIAGNOSIS — R1011 Right upper quadrant pain: Secondary | ICD-10-CM

## 2021-02-19 ENCOUNTER — Other Ambulatory Visit: Payer: PPO

## 2021-02-20 ENCOUNTER — Ambulatory Visit
Admission: RE | Admit: 2021-02-20 | Discharge: 2021-02-20 | Disposition: A | Discharge status: Home / Self Care | Payer: PPO | Source: Ambulatory Visit | Attending: Internal Medicine | Admitting: Internal Medicine

## 2021-02-20 DIAGNOSIS — R1011 Right upper quadrant pain: Secondary | ICD-10-CM

## 2021-02-20 DIAGNOSIS — N281 Cyst of kidney, acquired: Secondary | ICD-10-CM | POA: Diagnosis not present

## 2021-02-20 DIAGNOSIS — K8020 Calculus of gallbladder without cholecystitis without obstruction: Secondary | ICD-10-CM | POA: Diagnosis not present

## 2021-02-26 ENCOUNTER — Other Ambulatory Visit: Payer: PPO

## 2021-03-02 DIAGNOSIS — M7061 Trochanteric bursitis, right hip: Secondary | ICD-10-CM | POA: Diagnosis not present

## 2021-03-02 DIAGNOSIS — M7062 Trochanteric bursitis, left hip: Secondary | ICD-10-CM | POA: Diagnosis not present

## 2021-03-06 DIAGNOSIS — M5451 Vertebrogenic low back pain: Secondary | ICD-10-CM | POA: Diagnosis not present

## 2021-03-06 NOTE — Progress Notes (Signed)
76 y.o. G3P3 Married White or Caucasian Not Hispanic or Latino female here for breast and pelvic exam.  No vaginal bleeding. Sexually active, no pain.  ?  ?She has a h/o right breast cancer, s/p lumpectomy and radiation. Negative genetic testing.  ?H/O osteoporosis, improved with treatment. Followed by Rheumatology. She started Prolia in 9/19. ?  ?H/O grade 2 cystocele and grade 2 rectocele.  She has vaginal pressure when she needs to have a BM. Overall tolerable, not uncomfortable. She is having issues with emptying her bowels. She needs to reduce her rectocele to have a BM. Typically has one soft BM a day. Tolerable.  ?  ?She has a h/o mixed incontinence, PT didn't help. Myrbetriq helps some. She is wearing the menstrual underwear which keeps her dry. Mostly stable, the more she hydrates the worse it is.  ? ? ? ?Patient's last menstrual period was 01/07/1997.          ?Sexually active: Yes.    ?The current method of family planning is post menopausal status.    ?Exercising: Yes.     Walking  ?Smoker:  no ? ?Health Maintenance: ?Pap:  12-08-15 neg HPV HR neg ?History of abnormal Pap:  no ?MMG:  12/05/20 density B Bi-rads 1 neg  ?BMD:   06-06-17 osteopenia, FRAX 18/3.4%. She is on prolia q 6 months with Rheumatology. Reports a stable dexa last year ?Colonoscopy: 08/15/15 normal  ?TDaP:  2019 ?Gardasil: n/a ? ? reports that she has never smoked. She has never used smokeless tobacco. She reports that she does not currently use alcohol. She reports that she does not use drugs.  3 kids, 4 grandchildren. All her kids live here. 2 of her granddaughters are adopted from [place]. ? ?Past Medical History:  ?Diagnosis Date  ? Allergic rhinoconjunctivitis   ? Anemia   ? on meds  ? Arthritis   ? on med  ? Asthma   ? very mild  ? Breast cancer ([place])   ? RIGHT =s/p XRT and lumpectomy 2009  ? Cataract   ? bilateral sx  ? Depression   ? on meds  ? Environmental allergies   ? Environmental and seasonal allergies   ? Family history  of breast cancer   ? Family history of ovarian cancer   ? GERD (gastroesophageal reflux disease)   ? Heart murmur   ? slight  ? Hyperlipidemia   ? on meds  ? Hypertension   ? on meds  ? Infertility   ? took clomid with second pregnancy  ? Knee injury 06/13/2012  ? fell, cracked femur under knee cap-right  ? Left-sided weakness   ? left arm due to polio  ? Migraine   ? hx of menstrual  ? Obesity   ? Osteoporosis   ? on meds  ? Personal history of radiation therapy   ? Pneumonia   ? hx of  ? Polio   ? age 83 month old-"slight case"  ? Seasonal allergies   ? Stomach ulcer   ? Stroke [facility])   ? small stroke per pt per Dr.Shaw - dx 2021  ? ? ?Past Surgical History:  ?Procedure Laterality Date  ? BREAST BIOPSY    ? right breast ER/PR +  ? BREAST LUMPECTOMY  2009  ? R. Eston Esters   ? CATARACT EXTRACTION, BILATERAL Bilateral 05/2017, 06/2017  ? COLONOSCOPY  2017  ? KNEE CLOSED REDUCTION Right 11/04/2016  ? Procedure: CLOSED MANIPULATION RIGHT KNEE;  Surgeon: Gaynelle Arabian,  MD;  Location: [facility];  Service: Orthopedics;  Laterality: Right;  ? NISSEN FUNDOPLICATION  0240  ? SKIN CANCER DESTRUCTION    ? had freezing done to remove pre-cancerpus cells to right cheek   ? TONSILLECTOMY  76 years old  ? TOTAL KNEE ARTHROPLASTY Right 11/15/2013  ? Procedure: RIGHT TOTAL KNEE ARTHROPLASTY;  Surgeon: Gearlean Alf, MD;  Location: [facility];  Service: Orthopedics;  Laterality: Right;  ? TUBAL LIGATION  1976  ? UPPER GASTROINTESTINAL ENDOSCOPY  2019  ? recall 3 yrs  ? WISDOM TOOTH EXTRACTION    ? ? ?Current Outpatient Medications  ?Medication Sig Dispense Refill  ? amoxicillin (AMOXIL) 500 MG tablet Take 2,000 mg by mouth See admin instructions. 4 tabs before dental work    ? denosumab (PROLIA) 60 MG/ML SOSY injection Inject 1 mL into the skin every 6 (six) months.    ? donepezil (ARICEPT) 10 MG tablet     ? DULoxetine (CYMBALTA) 60 MG capsule Take 60 mg by mouth daily.    ? fexofenadine (ALLEGRA) 180 MG tablet Take 180 mg by mouth daily as  needed for allergies.    ? hydrochlorothiazide (MICROZIDE) 12.5 MG capsule     ? iron polysaccharides (NIFEREX) 150 MG capsule Take 1 capsule by mouth daily.    ? ketoconazole (NIZORAL) 2 % cream ketoconazole 2 % topical cream    ? loratadine (CLARITIN) 10 MG tablet Take 1 tablet by mouth daily.    ? Multiple Vitamin (MULTIVITAMIN) tablet Take 1 tablet by mouth daily.    ? MYRBETRIQ 25 MG TB24 tablet Take 25 mg by mouth daily.    ? olmesartan (BENICAR) 20 MG tablet Take 20 mg by mouth daily.    ? pantoprazole (PROTONIX) 40 MG tablet Take 40 mg by mouth 2 (two) times daily.    ? rosuvastatin (CRESTOR) 10 MG tablet Take 10 mg by mouth at bedtime.    ? zolpidem (AMBIEN) 10 MG tablet Take 1 tablet by mouth at bedtime as needed.    ? cyclobenzaprine (FLEXERIL) 5 MG tablet Take 5 mg by mouth 3 (three) times daily as needed. (Patient not taking: Reported on 01/24/2021)    ? ?Current Facility-Administered Medications  ?Medication Dose Route Frequency Provider Last Rate Last Admin  ? 0.9 %  sodium chloride infusion  500 mL Intravenous Once Armbruster, Carlota Raspberry, MD      ? ? ?Family History  ?Problem Relation Age of Onset  ? Breast cancer Paternal Aunt   ?     dx in her 68s, died in her 60s  ? Cancer Maternal Uncle   ?     NOS  ? Ovarian cancer Maternal Grandmother   ? Breast cancer Paternal Aunt   ?     dx in her 52s; bilateral breast cancer; double mastectomy  ? Breast cancer Cousin   ?     bilateral breast cancer in her 72s  ? Coronary artery disease Neg Hx   ? Colon cancer Neg Hx   ? Esophageal cancer Neg Hx   ? Pancreatic cancer Neg Hx   ? Stomach cancer Neg Hx   ? Liver disease Neg Hx   ? Colon polyps Neg Hx   ? Rectal cancer Neg Hx   ? ? ?Review of Systems  ?All other systems reviewed and are negative. ? ?Exam:   ?BP 130/84   Pulse 88   Ht 5\' 2"  (1.575 m)   Wt 139 lb (63 kg)   LMP  01/07/1997   SpO2 99%   BMI 25.42 kg/m?   Weight change: @WEIGHTCHANGE @ Height:   Height: 5\' 2"  (157.5 cm)  ?Ht Readings from Last 3  Encounters:  ?03/13/21 5\' 2"  (1.575 m)  ?02/05/21 5\' 2"  (1.575 m)  ?01/24/21 5\' 2"  (1.575 m)  ? ? ?General appearance: alert, cooperative and appears stated age ?Head: Normocephalic, without obvious abnormality, atraumatic ?Neck: no adenopathy, supple, symmetrical, trachea midline and thyroid normal to inspection and palpation ? ?Breasts: normal appearance, no masses or tenderness, evidence of right lumpectomy ?Abdomen: soft, non-tender; non distended,  no masses,  no organomegaly ?Extremities: extremities normal, atraumatic, no cyanosis or edema ?Skin: Skin color, texture, turgor normal. No rashes or lesions ?Lymph nodes: Cervical, supraclavicular, and axillary nodes normal. ?No abnormal inguinal nodes palpated ?Neurologic: Grossly normal ? ? ?Pelvic: External genitalia:  no lesions ?             Urethra:  normal appearing urethra with no masses, tenderness or lesions ?             Bartholins and Skenes: normal    ?             Vagina: atrophic appearing vagina with a small grade 2 cystocele and small grade 2 rectocele ?             Cervix: no lesions and flush with cervix ?              ?Bimanual Exam:  Uterus:   no masses or tenderness ?             Adnexa: no mass, fullness, tenderness ?              Rectovaginal: Confirms ?              Anus:  normal sphincter tone, no lesions ? ?Gae Dry chaperoned for the exam. ? ? ?1. Gynecologic exam normal ?Discussed calcium and vit D intake ?Colonoscopy UTD ?DEXA and Prolia with Rheumatology ? ?2. History of breast cancer ?Mammogram UTD ?Discussed breast self exam ? ?3. Rectocele ?She has started having some issues emptying with BM ? ?4. Abnormal defecation ?Reduce rectocele as needed ?Try metamucil to bulk the stool  ? ?5. Midline cystocele ?Tolerable ? ?6. Mixed incontinence ?On myrbetriq, tolerable.  ? ?

## 2021-03-13 ENCOUNTER — Ambulatory Visit (INDEPENDENT_AMBULATORY_CARE_PROVIDER_SITE_OTHER): Payer: PPO | Admitting: Obstetrics and Gynecology

## 2021-03-13 ENCOUNTER — Other Ambulatory Visit: Payer: Self-pay

## 2021-03-13 ENCOUNTER — Encounter: Payer: Self-pay | Admitting: Obstetrics and Gynecology

## 2021-03-13 VITALS — BP 130/84 | HR 88 | Ht 62.0 in | Wt 139.0 lb

## 2021-03-13 DIAGNOSIS — R198 Other specified symptoms and signs involving the digestive system and abdomen: Secondary | ICD-10-CM | POA: Diagnosis not present

## 2021-03-13 DIAGNOSIS — N8111 Cystocele, midline: Secondary | ICD-10-CM

## 2021-03-13 DIAGNOSIS — M5416 Radiculopathy, lumbar region: Secondary | ICD-10-CM | POA: Insufficient documentation

## 2021-03-13 DIAGNOSIS — N3946 Mixed incontinence: Secondary | ICD-10-CM

## 2021-03-13 DIAGNOSIS — N816 Rectocele: Secondary | ICD-10-CM

## 2021-03-13 DIAGNOSIS — Z01419 Encounter for gynecological examination (general) (routine) without abnormal findings: Secondary | ICD-10-CM

## 2021-03-13 DIAGNOSIS — Z853 Personal history of malignant neoplasm of breast: Secondary | ICD-10-CM

## 2021-03-13 DIAGNOSIS — E663 Overweight: Secondary | ICD-10-CM | POA: Insufficient documentation

## 2021-03-13 DIAGNOSIS — M8580 Other specified disorders of bone density and structure, unspecified site: Secondary | ICD-10-CM | POA: Insufficient documentation

## 2021-03-13 NOTE — Patient Instructions (Signed)
Try metamucil to help with bowel movements (generic name is Psyllium)  ? ? ? ?About Rectocele ? ?Overview ? ?A rectocele is a type of hernia which causes different degrees of bulging of the rectal tissues into the vaginal wall.  You may even notice that it presses against the vaginal wall so much that some vaginal tissues droop outside of the opening of your vagina. ? ?Causes of Rectocele ? ?The most common cause is childbirth.  The muscles and ligaments in the pelvis that hold up and support the female organs and vagina become stretched and weakened during labor and delivery.  The more babies you have, the more the support tissues are stretched and weakened.  Not everyone who has a baby will develop a rectocele.  Some women have stronger supporting tissue in the pelvis and may not have as much of a problem as others.  Women who have a Cesarean section usually do not get rectocele's unless they pushed a long time prior to the cesarean delivery. ? ?Other conditions that can cause a rectocele include chronic constipation, a chronic cough, a lot of heavy lifting, and obesity.  Older women may have this problem because the loss of female hormones causes the vaginal tissue to become weaker. ? ?Symptoms ? ?There may not be any symptoms.  If you do have symptoms, they may include: ?Pelvic pressure in the rectal area ?Protrusion of the lower part of the vagina through the opening of the vagina ?Constipation and trapping of the stool, making it difficult to have a bowel movement.  In severe cases, you may have to press on the lower part of your vagina to help push the stool out of you rectum.  This is called splinting to empty. ? ?Diagnosing Rectocele ? ?Your health care provider will ask about your symptoms and perform a pelvic exam.  S/he will ask you to bear down, pushing like you are having a bowel movement so as to see how far the lower part of the vagina protrudes into the vagina and possible outside of the vagina.   Your provider will also ask you to contract the muscles of your pelvis (like you are stopping the stream in the middle of urinating) to determine the strength of your pelvic muscles.  Your provider may also do a rectal exam. ? ?Treatment Options ? ?If you do not have any symptoms, no treatment may be necessary.  Other treatment options include: ?Pelvic floor exercises: Contracting the muscles in your genital area may help strengthen your muscles and support the organs.  Be sure to get proper exercise instruction from you physical therapist. ?A pessary (removealbe pelvic support device) sometimes helps rectocele symptoms. ?Surgery: Surgical repair may be necessary. In some cases the uterus may need to be taken out ( a hysterectomy) as well.  There are many types of surgery for pelvic support problems.  Look for physicians who specialize in repair procedures. ? ?You can take care of yourself by: ?Treating and preventing constipation ?Avoiding heavy lifting, and lifting correctly (with your legs, not with you waist or back) ?Treating a chronic cough or bronchitis ?Not smoking ?avoiding too much weight gain ?Doing pelvic floor exercises ? ?? 2007, [employer] Doc.33 ?

## 2021-03-21 DIAGNOSIS — M5451 Vertebrogenic low back pain: Secondary | ICD-10-CM | POA: Diagnosis not present

## 2021-03-23 DIAGNOSIS — I700 Atherosclerosis of aorta: Secondary | ICD-10-CM | POA: Diagnosis not present

## 2021-03-23 DIAGNOSIS — K449 Diaphragmatic hernia without obstruction or gangrene: Secondary | ICD-10-CM | POA: Diagnosis not present

## 2021-03-23 DIAGNOSIS — Z9889 Other specified postprocedural states: Secondary | ICD-10-CM | POA: Diagnosis not present

## 2021-03-23 DIAGNOSIS — K8010 Calculus of gallbladder with chronic cholecystitis without obstruction: Secondary | ICD-10-CM | POA: Diagnosis not present

## 2021-03-28 NOTE — Progress Notes (Signed)
Sent message, via epic in basket, requesting orders in epic from surgeon.  

## 2021-04-02 ENCOUNTER — Ambulatory Visit: Payer: Self-pay | Admitting: General Surgery

## 2021-04-02 DIAGNOSIS — K8020 Calculus of gallbladder without cholecystitis without obstruction: Secondary | ICD-10-CM

## 2021-04-03 NOTE — Patient Instructions (Addendum)
2 VISITORS  ARE ALLOWED TO COME WITH YOU AND STAY IN THE WAITING ROOM ONLY DURING PRE OP AND PROCEDURE.   ? ?**NO VISITORS ARE ALLOWED IN THE SHORT STAY AREA OR RECOVERY ROOM!!** ? ? ?  ? ? Your procedure is scheduled on: 04/09/21 ? ? Report to [facility] Main Entrance ? ?  Report to admitting at   7:45 AM ? ? Call this number if you have problems the morning of surgery [phone] ? ? Do not eat food :After Midnight. ? ? After Midnight you may have the following liquids until _7:00_____ AM DAY OF SURGERY ? ?Water ?Black Coffee (sugar ok, NO MILK/CREAM OR CREAMERS)  ?Tea (sugar ok, NO MILK/CREAM OR CREAMERS) regular and decaf                             ?Plain Jell-O (NO RED)                                           ?Fruit ices (not with fruit pulp, NO RED)                                     ?Popsicles (NO RED)                                                                  ?Juice: apple, WHITE grape, WHITE cranberry ?Sports drinks like Gatorade (NO RED) ?Clear broth(vegetable,chicken,beef) ? ?   At 6:45 AM on day of surgery drink the pre surgery Ensure. Have it finished by 7:00 am then nothing more by mouth . ? ?         ?       If you have questions, please contact your surgeon?s office. ? ? ?FOLLOW BOWEL PREP AND ANY ADDITIONAL PRE OP INSTRUCTIONS YOU RECEIVED FROM YOUR SURGEON'S OFFICE!!! ?  ?  ?Oral Hygiene is also important to reduce your risk of infection.                                    ?Remember - BRUSH YOUR TEETH THE MORNING OF SURGERY WITH YOUR REGULAR TOOTHPASTE ? ? Do NOT smoke after Midnight ? ? Take these medicines the morning of surgery with A SIP OF WATER: Cymbalta, Aricept, Pantoprazole, Myretriq ? ? ?                  ?           You may not have any metal on your body including hair pins, jewelry, and body piercing ? ?           Do not wear make-up, lotions, powders, perfumes/cologne, or deodorant ? ?Do not wear nail polish including gel and S&S, artificial/acrylic nails, or any  other type of covering on natural nails including finger and toenails. If you have artificial nails, gel coating, etc. that needs to be removed by a nail salon please have this  removed prior to surgery or surgery may need to be canceled/ delayed if the surgeon/ anesthesia feels like they are unable to be safely monitored.  ? ?Do not shave  48 hours prior to surgery.  ? ? ? Do not bring valuables to the hospital. [facility] NOT ?            RESPONSIBLE   FOR VALUABLES. ? ? Contacts, dentures or bridgework may not be worn into surgery. ? ?  ? Patients discharged on the day of surgery will not be allowed to drive home.  Someone NEEDS to stay with you for the first 24 hours after anesthesia. ? ? Special Instructions: Bring a copy of your healthcare power of attorney and living will documents the day of surgery if you haven't scanned them before. ? ?            Please read over the following fact sheets you were given: IF [place] [phone] ? ?   [facility] - Preparing for Surgery ?Before surgery, you can play an important role.  Because skin is not sterile, your skin needs to be as free of germs as possible.  You can reduce the number of germs on your skin by washing with CHG (chlorahexidine gluconate) soap before surgery.  CHG is an antiseptic cleaner which kills germs and bonds with the skin to continue killing germs even after washing. ?Please DO NOT use if you have an allergy to CHG or antibacterial soaps.  If your skin becomes reddened/irritated stop using the CHG and inform your nurse when you arrive at Short Stay. ?Do not shave (including legs and underarms) for at least 48 hours prior to the first CHG shower.   ?Please follow these instructions carefully: ? 1.  Shower with CHG Soap the night before surgery and the  morning of Surgery. ? 2.  If you choose to wash your hair, wash your hair first as usual with your  normal  shampoo. ? 3.  After you  shampoo, rinse your hair and body thoroughly to remove the  shampoo.                  ?          4.  Use CHG as you would any other liquid soap.  You can apply chg directly  to the skin and wash  ?                     Gently with a scrungie or clean washcloth. ? 5.  Apply the CHG Soap to your body ONLY FROM THE NECK DOWN.   Do not use on face/ open      ?                     Wound or open sores. Avoid contact with eyes, ears mouth and genitals (private parts).  ?                     Production manager,  Genitals (private parts) with your normal soap. ?            6.  Wash thoroughly, paying special attention to the area where your surgery  will be performed. ? 7.  Thoroughly rinse your body with warm water from the neck down. ? 8.  DO NOT shower/wash with your normal soap after using and rinsing off  the CHG  Soap. ?               9.  Pat yourself dry with a clean towel. ?           10.  Wear clean pajamas. ?           11.  Place clean sheets on your bed the night of your first shower and do not  sleep with pets. ?Day of Surgery : ?Do not apply any lotions/deodorants the morning of surgery.  Please wear clean clothes to the hospital/surgery center. ? ?FAILURE TO FOLLOW THESE INSTRUCTIONS MAY RESULT IN THE CANCELLATION OF YOUR SURGERY ?PATIENT SIGNATURE_________________________________ ? ?NURSE SIGNATURE__________________________________ ? ?________________________________________________________________________  ?

## 2021-04-04 ENCOUNTER — Encounter (HOSPITAL_COMMUNITY)
Admission: RE | Admit: 2021-04-04 | Discharge: 2021-04-04 | Disposition: A | Discharge status: Home / Self Care | Payer: PPO | Source: Ambulatory Visit | Attending: General Surgery | Admitting: General Surgery

## 2021-04-04 ENCOUNTER — Encounter (HOSPITAL_COMMUNITY): Payer: Self-pay

## 2021-04-04 ENCOUNTER — Other Ambulatory Visit: Payer: Self-pay

## 2021-04-04 DIAGNOSIS — Z01818 Encounter for other preprocedural examination: Secondary | ICD-10-CM | POA: Diagnosis not present

## 2021-04-04 DIAGNOSIS — K8020 Calculus of gallbladder without cholecystitis without obstruction: Secondary | ICD-10-CM | POA: Insufficient documentation

## 2021-04-04 LAB — COMPREHENSIVE METABOLIC PANEL
ALT: 31 U/L (ref 0–44)
AST: 28 U/L (ref 15–41)
Albumin: 3.8 g/dL (ref 3.5–5.0)
Alkaline Phosphatase: 63 U/L (ref 38–126)
Anion gap: 5 (ref 5–15)
BUN: 24 mg/dL — ABNORMAL HIGH (ref 8–23)
CO2: 25 mmol/L (ref 22–32)
Calcium: 9.3 mg/dL (ref 8.9–10.3)
Chloride: 109 mmol/L (ref 98–111)
Creatinine, Ser: 0.95 mg/dL (ref 0.44–1.00)
GFR, Estimated: >60 mL/min (ref >60)
Glucose, Bld: 81 mg/dL (ref 70–99)
Potassium: 3.7 mmol/L (ref 3.5–5.1)
Sodium: 139 mmol/L (ref 135–145)
Total Bilirubin: 0.3 mg/dL (ref 0.3–1.2)
Total Protein: 7 g/dL (ref 6.5–8.1)

## 2021-04-04 LAB — CBC WITH DIFFERENTIAL/PLATELET
Abs Immature Granulocytes: 0.01 10*3/uL (ref 0.00–0.07)
Basophils Absolute: 0.1 10*3/uL (ref 0.0–0.1)
Basophils Relative: 1 %
Eosinophils Absolute: 0.1 10*3/uL (ref 0.0–0.5)
Eosinophils Relative: 3 %
HCT: 36.3 % (ref 36.0–46.0)
Hemoglobin: 12.1 g/dL (ref 12.0–15.0)
Immature Granulocytes: 0 %
Lymphocytes Relative: 33 %
Lymphs Abs: 1.6 10*3/uL (ref 0.7–4.0)
MCH: 33.1 pg (ref 26.0–34.0)
MCHC: 33.3 g/dL (ref 30.0–36.0)
MCV: 99.2 fL (ref 80.0–100.0)
Monocytes Absolute: 0.4 10*3/uL (ref 0.1–1.0)
Monocytes Relative: 9 %
Neutro Abs: 2.7 10*3/uL (ref 1.7–7.7)
Neutrophils Relative %: 54 %
Platelets: 272 10*3/uL (ref 150–400)
RBC: 3.66 MIL/uL — ABNORMAL LOW (ref 3.87–5.11)
RDW: 13.5 % (ref 11.5–15.5)
WBC: 4.9 10*3/uL (ref 4.0–10.5)
nRBC: 0 % (ref 0.0–0.2)

## 2021-04-04 NOTE — Progress Notes (Signed)
Anesthesia note: ? ?Bowel prep reminder:NA ? ?PCP - Dr. Johny Sax ?Cardiologist -Dr. Keturah Barre. Bensim[place] 2010 not since ?Other-  ? ?Chest x-ray - no ?EKG - 04/04/21-chart ?Stress Test - 2010-epic ?ECHO - no ?Cardiac Cath - no ? ?Pacemaker/ICD device last checked:NA ? ?Sleep Study - no ?CPAP -  ? ?Pt is pre diabetic-NA ?Fasting Blood Sugar -  ?Checks Blood Sugar _____ ? ?Blood Thinner:ASA 81 mg ?Blood Thinner Instructions: ?Aspirin Instructions:has been on hold for a month ?Last Dose:03/03/21 ? ?Anesthesia review: yes ? ?Patient denies shortness of breath, fever, cough and chest pain at PAT appointment ?Pt gets SOB climbing 1 flight of stairs. Dr. Brigitte Pulse is the only Dr. Parks Ranger said he heard a slight murmer. She has not had an ECHO but she did have a stress test. ? ?Patient verbalized understanding of instructions that were given to them at the PAT appointment. Patient was also instructed that they will need to review over the PAT instructions again at home before surgery. yes ?

## 2021-04-08 NOTE — Anesthesia Preprocedure Evaluation (Addendum)
Anesthesia Evaluation  ?Patient identified by MRN, date of birth, ID band ?Patient awake ? ? ? ?Reviewed: ?Allergy & Precautions, NPO status , Patient's Chart, lab work & pertinent test results ? ?Airway ?Mallampati: II ? ?TM Distance: >3 FB ?Neck ROM: Full ? ? ? Dental ?no notable dental hx. ?(+) Teeth Intact, Dental Advisory Given ?  ?Pulmonary ?neg pulmonary ROS,  ?  ?Pulmonary exam normal ?breath sounds clear to auscultation ? ? ? ? ? ? Cardiovascular ?hypertension, Normal cardiovascular exam ?Rhythm:Regular Rate:Normal ? ? ?  ?Neuro/Psych ?TIA  ? GI/Hepatic ?GERD  Medicated and Controlled,  ?Endo/Other  ? ? Renal/GU ?Lab Results ?     Component                Value               Date                 ?     CREATININE               0.95                04/04/2021           ?     K                        3.7                 04/04/2021           ?            ?  ? ?  ?Musculoskeletal ? ? Abdominal ?  ?Peds ? Hematology ?Lab Results ?     Component                Value               Date                       ?     HGB                      12.1                04/04/2021                  ?     PLT                      272                 04/04/2021           ?   ?Anesthesia Other Findings ?NKA ? Reproductive/Obstetrics ? ?  ? ? ? ? ? ? ? ? ? ? ? ? ? ?  ?  ? ? ? ? ? ? ? ?Anesthesia Physical ?Anesthesia Plan ? ?ASA: 3 ? ?Anesthesia Plan: General  ? ?Post-op Pain Management:   ? ?Induction: Intravenous ? ?PONV Risk Score and Plan: 4 or greater and Treatment may vary due to age or medical condition and Ondansetron ? ?Airway Management Planned: Oral ETT ? ?Additional Equipment: None ? ?Intra-op Plan:  ? ?Post-operative Plan: Extubation in OR ? ?Informed Consent: I have reviewed the patients History and Physical, chart, labs and discussed the procedure including the risks, benefits and alternatives for the proposed anesthesia with the patient or authorized  representative who has indicated  his/her understanding and acceptance.  ? ? ? ?Dental advisory given ? ?Plan Discussed with: CRNA ? ?Anesthesia Plan Comments:   ? ? ? ? ? ?Anesthesia Quick Evaluation ? ?

## 2021-04-09 ENCOUNTER — Encounter (HOSPITAL_COMMUNITY)
Admission: RE | Disposition: A | Discharge status: Home / Self Care | Payer: Self-pay | Source: Ambulatory Visit | Attending: General Surgery

## 2021-04-09 ENCOUNTER — Ambulatory Visit (HOSPITAL_BASED_OUTPATIENT_CLINIC_OR_DEPARTMENT_OTHER): Payer: PPO | Admitting: Certified Registered Nurse Anesthetist

## 2021-04-09 ENCOUNTER — Ambulatory Visit (HOSPITAL_COMMUNITY): Payer: PPO | Admitting: Physician Assistant

## 2021-04-09 ENCOUNTER — Ambulatory Visit (HOSPITAL_COMMUNITY): Payer: PPO

## 2021-04-09 ENCOUNTER — Other Ambulatory Visit: Payer: Self-pay

## 2021-04-09 ENCOUNTER — Encounter (HOSPITAL_COMMUNITY): Payer: Self-pay | Admitting: General Surgery

## 2021-04-09 ENCOUNTER — Ambulatory Visit (HOSPITAL_COMMUNITY)
Admission: RE | Admit: 2021-04-09 | Discharge: 2021-04-09 | Disposition: A | Discharge status: Home / Self Care | Payer: PPO | Source: Ambulatory Visit | Attending: General Surgery | Admitting: General Surgery

## 2021-04-09 DIAGNOSIS — K828 Other specified diseases of gallbladder: Secondary | ICD-10-CM | POA: Diagnosis not present

## 2021-04-09 DIAGNOSIS — K449 Diaphragmatic hernia without obstruction or gangrene: Secondary | ICD-10-CM

## 2021-04-09 DIAGNOSIS — I10 Essential (primary) hypertension: Secondary | ICD-10-CM

## 2021-04-09 DIAGNOSIS — K8020 Calculus of gallbladder without cholecystitis without obstruction: Secondary | ICD-10-CM | POA: Diagnosis not present

## 2021-04-09 DIAGNOSIS — K219 Gastro-esophageal reflux disease without esophagitis: Secondary | ICD-10-CM | POA: Diagnosis not present

## 2021-04-09 DIAGNOSIS — R1011 Right upper quadrant pain: Secondary | ICD-10-CM | POA: Diagnosis present

## 2021-04-09 DIAGNOSIS — K8010 Calculus of gallbladder with chronic cholecystitis without obstruction: Secondary | ICD-10-CM | POA: Insufficient documentation

## 2021-04-09 DIAGNOSIS — Z8673 Personal history of transient ischemic attack (TIA), and cerebral infarction without residual deficits: Secondary | ICD-10-CM

## 2021-04-09 HISTORY — PX: CHOLECYSTECTOMY: SHX55

## 2021-04-09 SURGERY — LAPAROSCOPIC CHOLECYSTECTOMY WITH INTRAOPERATIVE CHOLANGIOGRAM
Anesthesia: General | Site: Abdomen

## 2021-04-09 MED ORDER — ENSURE PRE-SURGERY PO LIQD
296.0000 mL | Freq: Once | ORAL | Status: DC
Start: 2021-04-10 — End: 2021-04-09

## 2021-04-09 MED ORDER — LIDOCAINE HCL (PF) 2 % IJ SOLN
INTRAMUSCULAR | Status: AC
  Filled 2021-04-09: qty 5

## 2021-04-09 MED ORDER — CHLORHEXIDINE GLUCONATE CLOTH 2 % EX PADS: 6.0000 | MEDICATED_PAD | Freq: Once | CUTANEOUS | Status: DC

## 2021-04-09 MED ORDER — ACETAMINOPHEN 500 MG PO TABS: 1000.0000 mg | ORAL_TABLET | Freq: Three times a day (TID) | ORAL | 0 refills | Status: AC

## 2021-04-09 MED ORDER — LACTATED RINGERS IR SOLN
Status: DC | PRN
Start: 2021-04-09 — End: 2021-04-09
  Administered 2021-04-09: 1000 mL

## 2021-04-09 MED ORDER — ONDANSETRON HCL 4 MG/2ML IJ SOLN
INTRAMUSCULAR | Status: AC
  Filled 2021-04-09: qty 2

## 2021-04-09 MED ORDER — OXYCODONE HCL 5 MG PO TABS: 5.0000 mg | ORAL_TABLET | Freq: Three times a day (TID) | ORAL | 0 refills | Status: AC | PRN

## 2021-04-09 MED ORDER — BUPIVACAINE-EPINEPHRINE 0.25% -1:200000 IJ SOLN
INTRAMUSCULAR | Status: DC | PRN
  Administered 2021-04-09: 30 mL

## 2021-04-09 MED ORDER — SUGAMMADEX SODIUM 200 MG/2ML IV SOLN
INTRAVENOUS | Status: DC | PRN
  Administered 2021-04-09: 200 mg via INTRAVENOUS

## 2021-04-09 MED ORDER — 0.9 % SODIUM CHLORIDE (POUR BTL) OPTIME
TOPICAL | Status: DC | PRN
  Administered 2021-04-09: 1000 mL

## 2021-04-09 MED ORDER — ORAL CARE MOUTH RINSE
15.0000 mL | Freq: Once | OROMUCOSAL | Status: AC
Start: 2021-04-09 — End: 2021-04-09

## 2021-04-09 MED ORDER — PROPOFOL 10 MG/ML IV BOLUS
INTRAVENOUS | Status: AC
  Filled 2021-04-09: qty 20

## 2021-04-09 MED ORDER — LACTATED RINGERS IV SOLN: INTRAVENOUS | Status: DC

## 2021-04-09 MED ORDER — FENTANYL CITRATE (PF) 100 MCG/2ML IJ SOLN
INTRAMUSCULAR | Status: AC
  Filled 2021-04-09: qty 2

## 2021-04-09 MED ORDER — ACETAMINOPHEN 10 MG/ML IV SOLN
1000.0000 mg | Freq: Once | INTRAVENOUS | Status: DC | PRN
  Administered 2021-04-09: 1000 mg via INTRAVENOUS

## 2021-04-09 MED ORDER — ROCURONIUM BROMIDE 10 MG/ML (PF) SYRINGE
PREFILLED_SYRINGE | INTRAVENOUS | Status: DC | PRN
  Administered 2021-04-09: 50 mg via INTRAVENOUS

## 2021-04-09 MED ORDER — GLYCOPYRROLATE 0.2 MG/ML IJ SOLN
INTRAMUSCULAR | Status: DC | PRN
  Administered 2021-04-09: .2 mg via INTRAVENOUS

## 2021-04-09 MED ORDER — FENTANYL CITRATE PF 50 MCG/ML IJ SOSY
25.0000 ug | PREFILLED_SYRINGE | INTRAMUSCULAR | Status: DC | PRN
  Administered 2021-04-09: 25 ug via INTRAVENOUS

## 2021-04-09 MED ORDER — FENTANYL CITRATE PF 50 MCG/ML IJ SOSY
PREFILLED_SYRINGE | INTRAMUSCULAR | Status: AC
  Administered 2021-04-09: 25 ug via INTRAVENOUS
  Filled 2021-04-09: qty 2

## 2021-04-09 MED ORDER — ONDANSETRON HCL 4 MG/2ML IJ SOLN
INTRAMUSCULAR | Status: DC | PRN
  Administered 2021-04-09: 4 mg via INTRAVENOUS

## 2021-04-09 MED ORDER — LIDOCAINE 2% (20 MG/ML) 5 ML SYRINGE
INTRAMUSCULAR | Status: DC | PRN
  Administered 2021-04-09: 100 mg via INTRAVENOUS

## 2021-04-09 MED ORDER — SPY AGENT GREEN - (INDOCYANINE FOR INJECTION)
INTRAMUSCULAR | Status: DC | PRN
  Administered 2021-04-09: 2 mL via INTRAVENOUS

## 2021-04-09 MED ORDER — ACETAMINOPHEN 500 MG PO TABS
1000.0000 mg | ORAL_TABLET | ORAL | Status: AC
  Administered 2021-04-09: 1000 mg via ORAL
  Filled 2021-04-09: qty 2

## 2021-04-09 MED ORDER — PROPOFOL 10 MG/ML IV BOLUS
INTRAVENOUS | Status: DC | PRN
  Administered 2021-04-09: 100 mg via INTRAVENOUS
  Administered 2021-04-09: 50 mg via INTRAVENOUS

## 2021-04-09 MED ORDER — ROCURONIUM BROMIDE 10 MG/ML (PF) SYRINGE
PREFILLED_SYRINGE | INTRAVENOUS | Status: AC
  Filled 2021-04-09: qty 10

## 2021-04-09 MED ORDER — ONDANSETRON HCL 4 MG/2ML IJ SOLN
4.0000 mg | Freq: Once | INTRAMUSCULAR | Status: AC | PRN
  Administered 2021-04-09: 4 mg via INTRAVENOUS

## 2021-04-09 MED ORDER — PHENYLEPHRINE 40 MCG/ML (10ML) SYRINGE FOR IV PUSH (FOR BLOOD PRESSURE SUPPORT)
PREFILLED_SYRINGE | INTRAVENOUS | Status: AC
  Filled 2021-04-09: qty 10

## 2021-04-09 MED ORDER — IOHEXOL 300 MG/ML  SOLN: INTRAMUSCULAR | Status: DC | PRN

## 2021-04-09 MED ORDER — SODIUM CHLORIDE 0.9 % IV SOLN
2.0000 g | INTRAVENOUS | Status: AC
  Administered 2021-04-09: 2 g via INTRAVENOUS
  Filled 2021-04-09: qty 2

## 2021-04-09 MED ORDER — BUPIVACAINE-EPINEPHRINE (PF) 0.25% -1:200000 IJ SOLN
INTRAMUSCULAR | Status: AC
  Filled 2021-04-09: qty 30

## 2021-04-09 MED ORDER — DEXAMETHASONE SODIUM PHOSPHATE 10 MG/ML IJ SOLN
INTRAMUSCULAR | Status: AC
  Filled 2021-04-09: qty 1

## 2021-04-09 MED ORDER — CHLORHEXIDINE GLUCONATE 0.12 % MT SOLN
15.0000 mL | Freq: Once | OROMUCOSAL | Status: AC
  Administered 2021-04-09: 15 mL via OROMUCOSAL

## 2021-04-09 MED ORDER — FENTANYL CITRATE (PF) 100 MCG/2ML IJ SOLN
INTRAMUSCULAR | Status: DC | PRN
  Administered 2021-04-09 (×3): 50 ug via INTRAVENOUS
  Administered 2021-04-09 (×2): 25 ug via INTRAVENOUS

## 2021-04-09 MED ORDER — ACETAMINOPHEN 10 MG/ML IV SOLN
INTRAVENOUS | Status: AC
  Filled 2021-04-09: qty 100

## 2021-04-09 MED ORDER — PHENYLEPHRINE 40 MCG/ML (10ML) SYRINGE FOR IV PUSH (FOR BLOOD PRESSURE SUPPORT)
PREFILLED_SYRINGE | INTRAVENOUS | Status: DC | PRN
  Administered 2021-04-09: 80 ug via INTRAVENOUS

## 2021-04-09 SURGICAL SUPPLY — 61 items
ADH SKN CLS APL DERMABOND .7 (GAUZE/BANDAGES/DRESSINGS)
APL PRP STRL LF DISP 70% ISPRP (MISCELLANEOUS) ×1
APL SKNCLS STERI-STRIP NONHPOA (GAUZE/BANDAGES/DRESSINGS)
APL SRG 38 LTWT LNG FL B (MISCELLANEOUS)
APPLICATOR ARISTA FLEXITIP XL (MISCELLANEOUS) IMPLANT
APPLIER CLIP 5 13 M/L LIGAMAX5 (MISCELLANEOUS) ×2
APPLIER CLIP ROT 10 11.4 M/L (STAPLE)
APR CLP MED LRG 11.4X10 (STAPLE)
APR CLP MED LRG 5 ANG JAW (MISCELLANEOUS) ×1
BAG COUNTER SPONGE SURGICOUNT (BAG) IMPLANT
BAG SPEC RTRVL 10 TROC 200 (ENDOMECHANICALS) ×1
BAG SPNG CNTER NS LX DISP (BAG)
BENZOIN TINCTURE PRP APPL 2/3 (GAUZE/BANDAGES/DRESSINGS) IMPLANT
BNDG ADH 1X3 SHEER STRL LF (GAUZE/BANDAGES/DRESSINGS) ×12 IMPLANT
BNDG ADH THN 3X1 STRL LF (GAUZE/BANDAGES/DRESSINGS) ×4
CABLE HIGH FREQUENCY MONO STRZ (ELECTRODE) ×3 IMPLANT
CHLORAPREP W/TINT 26 (MISCELLANEOUS) ×3 IMPLANT
CLIP APPLIE 5 13 M/L LIGAMAX5 (MISCELLANEOUS) IMPLANT
CLIP APPLIE ROT 10 11.4 M/L (STAPLE) IMPLANT
CLIP LIGATING HEMO O LOK GREEN (MISCELLANEOUS) IMPLANT
COVER MAYO STAND XLG (MISCELLANEOUS) ×3 IMPLANT
COVER SURGICAL LIGHT HANDLE (MISCELLANEOUS) ×3 IMPLANT
DERMABOND ADVANCED (GAUZE/BANDAGES/DRESSINGS)
DERMABOND ADVANCED .7 DNX12 (GAUZE/BANDAGES/DRESSINGS) IMPLANT
DRAPE C-ARM 42X120 X-RAY (DRAPES) ×1 IMPLANT
DRSG TEGADERM 2-3/8X2-3/4 SM (GAUZE/BANDAGES/DRESSINGS) IMPLANT
ELECT REM PT RETURN 15FT ADLT (MISCELLANEOUS) ×3 IMPLANT
GAUZE SPONGE 2X2 8PLY STRL LF (GAUZE/BANDAGES/DRESSINGS) IMPLANT
GLOVE SRG 8 PF TXTR STRL LF DI (GLOVE) ×2 IMPLANT
GLOVE SURG MICRO LTX SZ7.5 (GLOVE) ×4 IMPLANT
GLOVE SURG UNDER POLY LF SZ8 (GLOVE) ×2
GOWN STRL REUS W/ TWL XL LVL3 (GOWN DISPOSABLE) ×6 IMPLANT
GOWN STRL REUS W/TWL XL LVL3 (GOWN DISPOSABLE) ×8
GRASPER SUT TROCAR 14GX15 (MISCELLANEOUS) ×1 IMPLANT
HEMOSTAT ARISTA ABSORB 3G PWDR (HEMOSTASIS) IMPLANT
HEMOSTAT SNOW SURGICEL 2X4 (HEMOSTASIS) IMPLANT
IRRIG SUCT STRYKERFLOW 2 WTIP (MISCELLANEOUS) ×2
IRRIGATION SUCT STRKRFLW 2 WTP (MISCELLANEOUS) ×2 IMPLANT
KIT BASIN OR (CUSTOM PROCEDURE TRAY) ×3 IMPLANT
KIT TURNOVER KIT A (KITS) ×1 IMPLANT
L-HOOK LAP DISP 36CM (ELECTROSURGICAL)
LHOOK LAP DISP 36CM (ELECTROSURGICAL) IMPLANT
POUCH RETRIEVAL ECOSAC 10 (ENDOMECHANICALS) ×2 IMPLANT
POUCH RETRIEVAL ECOSAC 10MM (ENDOMECHANICALS) ×2
SCISSORS LAP 5X35 DISP (ENDOMECHANICALS) ×3 IMPLANT
SET CHOLANGIOGRAPH MIX (MISCELLANEOUS) ×1 IMPLANT
SET TUBE SMOKE EVAC HIGH FLOW (TUBING) ×3 IMPLANT
SLEEVE XCEL OPT CAN 5 100 (ENDOMECHANICALS) ×6 IMPLANT
SPIKE FLUID TRANSFER (MISCELLANEOUS) ×3 IMPLANT
SPONGE GAUZE 2X2 STER 10/PKG (GAUZE/BANDAGES/DRESSINGS)
STRIP CLOSURE SKIN 1/2X4 (GAUZE/BANDAGES/DRESSINGS) IMPLANT
SUT MNCRL AB 4-0 PS2 18 (SUTURE) ×3 IMPLANT
SUT VIC AB 0 UR5 27 (SUTURE) IMPLANT
SUT VICRYL 0 TIES 12 18 (SUTURE) IMPLANT
SUT VICRYL 0 UR6 27IN ABS (SUTURE) ×1 IMPLANT
TOWEL OR 17X26 10 PK STRL BLUE (TOWEL DISPOSABLE) ×3 IMPLANT
TOWEL OR NON WOVEN STRL DISP B (DISPOSABLE) ×3 IMPLANT
TRAY LAPAROSCOPIC (CUSTOM PROCEDURE TRAY) ×3 IMPLANT
TROCAR BLADELESS OPT 5 100 (ENDOMECHANICALS) ×3 IMPLANT
TROCAR XCEL BLUNT TIP 100MML (ENDOMECHANICALS) IMPLANT
TROCAR XCEL NON-BLD 11X100MML (ENDOMECHANICALS) IMPLANT

## 2021-04-09 NOTE — Transfer of Care (Signed)
Immediate Anesthesia Transfer of Care Note ? ?Patient: Kelsey Mueller ? ?Procedure(s) Performed: LAPAROSCOPIC CHOLECYSTECTOMY WITH ICG DYE (Abdomen) ?INDOCYANINE GREEN FLUORESCENCE IMAGING (ICG) (Abdomen) ? ?Patient Location: PACU ? ?Anesthesia Type:General ? ?Level of Consciousness: awake and patient cooperative ? ?Airway & Oxygen Therapy: Patient Spontanous Breathing and Patient connected to face mask ? ?Post-op Assessment: Report given to RN and Post -op Vital signs reviewed and stable ? ?Post vital signs: Reviewed and stable ? ?Last Vitals:  ?Vitals Value Taken Time  ?BP 158/85 04/09/21 1031  ?Temp    ?Pulse 62 04/09/21 1033  ?Resp    ?SpO2 98 % 04/09/21 1033  ?Vitals shown include unvalidated device data. ? ?Last Pain:  ?Vitals:  ? 04/09/21 0839  ?TempSrc:   ?PainSc: 0-No pain  ?   ? ?  ? ?Complications: No notable events documented. ?

## 2021-04-09 NOTE — H&P (Signed)
REFERRING PHYSICIAN: Jacqulyn Cane* ? ?PROVIDER: Karsen Nakanishi Leanne Chang, MD ? ?MRN: [id number] ?DOB: 1945-12-01 ?DATE OF ENCOUNTER: 03/23/2021 ? ?Subjective  ? ?Chief Complaint: New Consultation (RUQ pain) ? ? ?History of Present Illness: ?Kelsey Mueller is a 76 y.o. female who is seen today as an office consultation at the request of Dr. Brigitte Pulse for evaluation of New Consultation (RUQ pain) ?.  ? ?Is accompanied by her husband. She reports about a month or so history of right sided upper abdominal pain. It is pretty constant but will increase at times. When it increases it worsens for about 10 to 15 minutes. It does not radiate. It is not really associated with any nausea, vomiting, bloating diarrhea or constipation. She denies any dysuria or hematuria. She points to her right upper abdomen. She saw her PCP and had a UA which was unremarkable after being treated with Macrobid and still had persistent symptoms. She had an ultrasound which showed gallstones up to 2 cm with findings consistent with chronic cholecystitis. She has had a prior laparoscopic hiatal hernia repair with Nissen fundoplication she states at least 15 years ago. She is followed by Dr. Havery Moros. She has not found anything that makes the pain worse or better. ? ?Denies any chest pain or chest pressure. She states her heartburn is well controlled. She denies any dysphagia. She denies any burping or belching. She states that her heartburn used to be so bad that what ever she has currently does not really bother her. ? ? ? ?Review of Systems: ?A complete review of systems was obtained from the patient. I have reviewed this information and discussed as appropriate with the patient. See HPI as well for other ROS. ? ?ROS  ? ?Medical History: ?Past Medical History:  ?Diagnosis Date  ? Anemia  ? Asthma, unspecified asthma severity, unspecified whether complicated, unspecified whether persistent  ? GERD (gastroesophageal reflux disease)  ? History of  cancer  ? ?There is no problem list on file for this patient. ? ?Past Surgical History:  ?Procedure Laterality Date  ? HERNIA REPAIR  ? JOINT REPLACEMENT  ? MASTECTOMY PARTIAL / LUMPECTOMY  ? ? ?Not on File ? ?Current Outpatient Medications on File Prior to Visit  ?Medication Sig Dispense Refill  ? denosumab (PROLIA) 60 mg/mL inj syringe Inject subcutaneously  ? donepeziL (ARICEPT) 10 MG tablet donepezil 10 mg tablet  ? DULoxetine (CYMBALTA) 30 MG DR capsule duloxetine 30 mg capsule,delayed release  ? iron polysaccharides (FERREX) 150 mg iron capsule 1 capsule  ? mirabegron (MYRBETRIQ) 25 mg ER Tablet Myrbetriq 25 mg tablet,extended release  ? multivitamin tablet Take 1 tablet by mouth once daily  ? olmesartan (BENICAR) 20 MG tablet olmesartan 20 mg tablet  ? pantoprazole (PROTONIX) 40 MG DR tablet pantoprazole 40 mg tablet,delayed release  ? rosuvastatin (CRESTOR) 10 MG tablet Take 10 mg by mouth at bedtime  ? ?No current facility-administered medications on file prior to visit.  ? ?History reviewed. No pertinent family history.  ? ?Social History  ? ?Tobacco Use  ?Smoking Status Never  ?Smokeless Tobacco Never  ? ? ?Social History  ? ?Socioeconomic History  ? Marital status: Married  ?Tobacco Use  ? Smoking status: Never  ? Smokeless tobacco: Never  ?Vaping Use  ? Vaping Use: Never used  ?Substance and Sexual Activity  ? Alcohol use: Never  ? Drug use: Never  ? ?Objective:  ? ?Vitals:  ?03/23/21 0902  ?BP: (!) 160/78  ?Pulse: 95  ?Temp: 36.2 ?C (  97.1 ?F)  ?SpO2: 98%  ?Weight: 63 kg (139 lb)  ?Height: 157.5 cm ('5\' 2"'$ )  ? ?Body mass index is 25.42 kg/m?. ? ?Constitutional: NAD; conversant; no deformities ?Eyes: Moist conjunctiva; no lid lag; anicteric; PERRL ?Neck: Trachea midline; no thyromegaly ?Lungs: Normal respiratory effort; no tactile fremitus ?CV: RRR; no palpable thrills; no pitting edema ?GI: Abd soft, nontender except for some mild tenderness on deep palpation of the right upper quadrant, old trocar  scars; no palpable hepatosplenomegaly ?MSK: Normal gait; no clubbing/cyanosis ?Psychiatric: Appropriate affect; alert and oriented x3 ?Lymphatic: No palpable cervical or axillary lymphadenopathy ?Skin: No rash, lesions or jaundice ? ?Labs, Imaging and Diagnostic Testing: ?Jan 2023 ?Esophagogastric landmarks were identified: the Z-line was found at 32 cm, the ?gastroesophageal junction was found at 32 cm and the upper extent of the gastric folds was ?found at 38 cm from the incisors. ?Findings: ?- A 6 cm hiatal hernia was present. ?- The Z-line was irregular and was found 32 cm from the incisors, with a roughly 1cm tongue ?of salmon colored tissue with a few small islands. Difficult to insufflate the area well due to ?hiatal hernia. Biopsies were taken with a cold forceps for histology. Overall the area appeared ?stable compared to prior exam. ?- A few diminutive sessile polyps were found in the gastric body, grossly consistent with ?benign fundic gland polyps. ?- Evidence of a Nissen fundoplication was found in the cardia. The wrap appeared quite ?loose. ?- The exam of the stomach was otherwise normal. Interval healing of prior gastric ulcers. ?- The duodenal bulb and second portion of the duodenum were normal. ? ?Reviewed his CT scan from 2017 that did show cholelithiasis as well as evidence of recurrent hernia with portions of the wrap probably above the diaphragm ? ?Assessment and Plan:  ? ? ?Diagnoses and all orders for this visit: ? ?Chronic cholecystitis with calculus ? ?History of Nissen fundoplication ? ?Hiatal hernia- recurrent ? ? ? ?I believe the patient's symptoms are most consistent with gallbladder disease-she has some mild tenderness on deep palpation as well as findings on ultrasound of chronic cholecystitis. ? ?We discussed gallbladder disease. The patient was given Neurosurgeon. We discussed non-operative and operative management. We discussed the signs & symptoms of acute  cholecystitis ? ?I discussed laparoscopic cholecystectomy with possible IOC in detail. The patient was given educational material as well as diagrams detailing the procedure. We discussed the risks and benefits of a laparoscopic cholecystectomy including, but not limited to bleeding, infection, injury to surrounding structures such as the intestine or liver, bile leak, retained gallstones, need to convert to an open procedure, prolonged diarrhea, blood clots such as DVT, common bile duct injury, anesthesia risks, and possible need for additional procedures. We discussed the typical post-operative recovery course. I explained that the likelihood of improvement of their symptoms is good. ? ?Respect to her recurrent hiatal hernia and possible loose Nissen fundoplication she is relatively asymptomatic. I would not recommend reoperative surgery ? ?This patient encounter took 45 minutes today to perform the following: take history, perform exam, review outside records, interpret imaging, counsel the patient on their diagnosis and document encounter, findings & plan in the EHR and required a moderate level of medical decision making with decision to proceed with surgery ? ?No follow-ups on file. ? ?Shenay Torti Leanne Chang, MD  ?General, Minimally Invasive, & Bariatric Surgery ? ? ? ? ?Electronically signed by Rudean Curt, MD at 03/23/2021 9:40 AM EDT ? ?

## 2021-04-09 NOTE — Anesthesia Postprocedure Evaluation (Signed)
Anesthesia Post Note ? ?Patient: Kelsey Mueller ? ?Procedure(s) Performed: LAPAROSCOPIC CHOLECYSTECTOMY WITH ICG DYE (Abdomen) ?INDOCYANINE GREEN FLUORESCENCE IMAGING (ICG) (Abdomen) ? ?  ? ?Patient location during evaluation: PACU ?Anesthesia Type: General ?Level of consciousness: awake and alert ?Pain management: pain level controlled ?Vital Signs Assessment: post-procedure vital signs reviewed and stable ?Respiratory status: spontaneous breathing, nonlabored ventilation, respiratory function stable and patient connected to nasal cannula oxygen ?Cardiovascular status: blood pressure returned to baseline and stable ?Postop Assessment: no apparent nausea or vomiting ?Anesthetic complications: no ? ? ?No notable events documented. ? ?Last Vitals:  ?Vitals:  ? 04/09/21 1300 04/09/21 1315  ?BP: 123/61 129/66  ?Pulse: (!) 49 65  ?Resp: 14 15  ?Temp:  36.7 ?C  ?SpO2: 93% 95%  ?  ?Last Pain:  ?Vitals:  ? 04/09/21 1315  ?TempSrc:   ?PainSc: 0-No pain  ? ? ?  ?  ?  ?  ?  ?  ? ?[place] ? ? ? ? ?

## 2021-04-09 NOTE — Discharge Instructions (Signed)
[facility] LAPAROSCOPIC SURGERY: POST OP INSTRUCTIONS Always review your discharge instruction sheet given to you by the facility where your surgery was performed. IF YOU HAVE DISABILITY OR FAMILY LEAVE FORMS, YOU MUST BRING THEM TO THE OFFICE FOR PROCESSING.   DO NOT GIVE THEM TO YOUR DOCTOR.  PAIN CONTROL  First take acetaminophen (Tylenol) AND/or ibuprofen (Advil) to control your pain after surgery.  Follow directions on package.  Taking acetaminophen (Tylenol) and/or ibuprofen (Advil) regularly after surgery will help to control your pain and lower the amount of prescription pain medication you may need.  You should not take more than 3,000 mg (3 grams) of acetaminophen (Tylenol) in 24 hours.  You should not take ibuprofen (Advil), aleve, motrin, naprosyn or other NSAIDS if you have a history of stomach ulcers or chronic kidney disease.  A prescription for pain medication may be given to you upon discharge.  Take your pain medication as prescribed, if you still have uncontrolled pain after taking acetaminophen (Tylenol) or ibuprofen (Advil). Use ice packs to help control pain. If you need a refill on your pain medication, please contact your pharmacy.  They will contact our office to request authorization. Prescriptions will not be filled after 5pm or on week-ends.  HOME MEDICATIONS Take your usually prescribed medications unless otherwise directed.  DIET You should follow a light diet the first few days after arrival home.  Be sure to include lots of fluids daily. Avoid fatty, fried foods.   CONSTIPATION It is common to experience some constipation after surgery and if you are taking pain medication.  Increasing fluid intake and taking a stool softener (such as Colace) will usually help or prevent this problem from occurring.  A mild laxative (Milk of Magnesia or Miralax) should be taken according to package instructions if there are no bowel movements after 48  hours.  WOUND/INCISION CARE Most patients will experience some swelling and bruising in the area of the incisions.  Ice packs will help.  Swelling and bruising can take several days to resolve.  Unless discharge instructions indicate otherwise, follow guidelines below  STERI-STRIPS - you may remove your outer bandages 48 hours after surgery, and you may shower at that time.  You have steri-strips (small skin tapes) in place directly over the incision.  These strips should be left on the skin for 7-10 days.   DERMABOND/SKIN GLUE - you may shower in 24 hours.  The glue will flake off over the next 2-3 weeks. Any sutures or staples will be removed at the office during your follow-up visit.  ACTIVITIES You may resume regular (light) daily activities beginning the next day--such as daily self-care, walking, climbing stairs--gradually increasing activities as tolerated.  You may have sexual intercourse when it is comfortable.  Refrain from any heavy lifting or straining until approved by your doctor. You may drive when you are no longer taking prescription pain medication, you can comfortably wear a seatbelt, and you can safely maneuver your car and apply brakes.  FOLLOW-UP You should see your doctor in the office for a follow-up appointment approximately 2-3 weeks after your surgery.  You should have been given your post-op/follow-up appointment when your surgery was scheduled.  If you did not receive a post-op/follow-up appointment, make sure that you call for this appointment within a day or two after you arrive home to insure a convenient appointment time.  OTHER INSTRUCTIONS   WHEN TO CALL YOUR DOCTOR: Fever over 101.0 Inability to urinate Continued   bleeding from incision. Increased pain, redness, or drainage from the incision. Increasing abdominal pain  The clinic staff is available to answer your questions during regular business hours.  Please don't hesitate to call and ask to speak to  one of the nurses for clinical concerns.  If you have a medical emergency, go to the nearest emergency room or call 911.  A surgeon from [facility] is always on call at the hospital. [address], [address], [place], [place]  [address] ? [address] [address], [place], [place]   [address] [phone] ? [phone] ? FAX [phone] Web site: [email or web address]  

## 2021-04-09 NOTE — Anesthesia Procedure Notes (Signed)
Procedure Name: Intubation ?Date/Time: 04/09/2021 9:20 AM ?Performed by: Claudia Desanctis, CRNA ?Pre-anesthesia Checklist: Patient identified, Emergency Drugs available, Suction available and Patient being monitored ?Patient Re-evaluated:Patient Re-evaluated prior to induction ?Oxygen Delivery Method: Circle system utilized ?Preoxygenation: Pre-oxygenation with 100% oxygen ?Induction Type: IV induction ?Ventilation: Mask ventilation without difficulty ?Laryngoscope Size: Mac, 4 and Glidescope ?Grade View: Grade I ?Tube type: Oral ?Tube size: 7.0 mm ?Number of attempts: 1 ?Airway Equipment and Method: Stylet ?Placement Confirmation: ETT inserted through vocal cords under direct vision, positive ETCO2 and breath sounds checked- equal and bilateral ?Secured at: 21 cm ?Tube secured with: Tape ?Dental Injury: Teeth and Oropharynx as per pre-operative assessment  ?Comments: Intubated by EMT student with glidescope mac training blade ? ? ? ? ?

## 2021-04-09 NOTE — Op Note (Signed)
Kelsey Mueller ?[id number] ?March 06, 1945 ?04/09/2021 ? ?Laparoscopic Cholecystectomy with ICG dye immunofluorescence and attempted IOC Procedure Note ? ?Indications: This patient presents with symptomatic gallbladder disease and will undergo laparoscopic cholecystectomy. ? ?Pre-operative Diagnosis: Symptomatic cholelithiasis ? ?Post-operative Diagnosis: Same, probable chronic calculus cholecystitis; recurrent hiatal hernia ? ?Surgeon: Kelsey Pickerel MD FACS ? ?Assistants: Kelsey Mueller RNFA ? ?Anesthesia: General endotracheal anesthesia ? ? ?Procedure Details  ?The patient was seen again in the Holding Room. The risks, benefits, complications, treatment options, and expected outcomes were discussed with the patient. The possibilities of reaction to medication, pulmonary aspiration, perforation of viscus, bleeding, recurrent infection, finding a normal gallbladder, the need for additional procedures, failure to diagnose a condition, the possible need to convert to an open procedure, and creating a complication requiring transfusion or operation were discussed with the patient. The likelihood of improving the patient's symptoms with return to their baseline status is good.  The patient and/or family concurred with the proposed plan, giving informed consent. The site of surgery properly noted. The patient was taken to Operating Room, identified as Kelsey Mueller and the procedure verified as Laparoscopic Cholecystectomy with possible Intraoperative Cholangiogram. A Time Out was held and the above information confirmed. Antibiotic prophylaxis was administered.  The patient had been administered ICG dye preoperatively by anesthesia ? ?Prior to the induction of general anesthesia, antibiotic prophylaxis was administered. General endotracheal anesthesia was then administered and tolerated well. After the induction, the abdomen was prepped with Chloraprep and draped in the sterile fashion. The patient was positioned in the supine  position. ? ?Local anesthetic agent was injected into the skin near the umbilicus and an incision made. We dissected down to the abdominal fascia with blunt dissection.  The fascia was incised vertically and we entered the peritoneal cavity bluntly.  A pursestring suture of 0-Vicryl was placed around the fascial opening.  The Hasson cannula was inserted and secured with the stay suture.  Patient had very thin attenuated fascia.  pneumoperitoneum was then created with CO2 and tolerated well without any adverse changes in the patient's vital signs. An 5-mm port was placed in the subxiphoid position.  Two 5-mm ports were placed in the right upper quadrant. All skin incisions were infiltrated with a local anesthetic agent before making the incision and placing the trocars.  ? ?We positioned the patient in reverse Trendelenburg, tilted slightly to the patient's left.  The patient had a very floppy large left hepatic lobe that was obscuring the infundibulum but I was able to lifted up and go underneath it with instruments when needed.  The gallbladder was identified, the fundus grasped and retracted cephalad. Adhesions were lysed bluntly and with the electrocautery where indicated, taking care not to injure any adjacent organs or viscus. The infundibulum was grasped and retracted laterally, exposing the peritoneum overlying the triangle of Calot. This was then divided and exposed in a blunt fashion. A critical view of the cystic duct and cystic artery was obtained.  Near infrared immunofluorescence was activated and we could see the opacification of the cystic duct, gallbladder and the confluence of the cystic duct and common bile duct.  The cystic duct was clearly identified and bluntly dissected circumferentially. The cystic duct was ligated with a clip distally.   An incision was made in the cystic duct and the [facility] cholangiogram catheter introduced.  However the cystic duct ended up becoming separated from the  gallbladder where it had been partially transected.  I therefore decided to not perform  the cholangiogram and just secure the cystic duct stump.   ? ?The cystic duct stump was then ligated with 3 clips. The cystic artery (both a small anterior and posterior branch )which had been identified & dissected free was ligated with clips and divided as well.  ? ?The gallbladder was dissected from the liver bed in retrograde fashion with the electrocautery.  There was some spillage of bile from the posterior aspect of the gallbladder but no stone spillage.  The gallbladder was removed and placed in an Ecco sac.  The gallbladder and Ecco sac were then removed through the umbilical port site. The liver bed was irrigated and inspected. Hemostasis was achieved with the electrocautery. Copious irrigation was utilized and was repeatedly aspirated until clear.  I then looked at the left upper quadrant.  I was able to gently lift up the left lobe of the liver to expose the proximal stomach and the diaphragm.  There is some typical expected adhesions between the undersurface of the left lobe and the upper stomach and hiatus.  I did not take these down.  It was evident that the patient had a recurrent hiatal hernia.  I was unable to tell whether or not the wrap had slipped.  It appeared that it was probably partially herniated above the diaphragm.  The pursestring suture was used to close the umbilical fascia.  An additional interrupted 0 Vicryl was placed at the umbilical fascia with the Kelsey Mueller suture passer with laparoscopic guidance. ? ?We again inspected the right upper quadrant for hemostasis.  The umbilical closure was inspected and there was no air leak and nothing trapped within the closure. Pneumoperitoneum was released as we removed the trocars.  4-0 Monocryl was used to close the skin.  Dermabond was applied. The patient was then extubated and brought to the recovery room in stable condition. Instrument, sponge, and needle  counts were correct at closure and at the conclusion of the case.  ? ?Findings: ?Probable chronic cholecystitis with Cholelithiasis ?Positive critical view ?Large floppy left hepatic lobe ?Near infrared immunofluorescence as secondary confirmation for the location of cystic duct ?Appears to have recurrent hiatal hernia ? ?Estimated Blood Loss: Minimal ?        ?Drains: None ?        ?Specimens: Gallbladder     ?      ?Complications: None; patient tolerated the procedure well. ?        ?Disposition: PACU - hemodynamically stable. ?        ?Condition: stable ? ?Leighton Ruff. Redmond Pulling, MD, FACS ?General, Bariatric, & Minimally Invasive Surgery ?[facility], [place] ? ?

## 2021-04-09 NOTE — Interval H&P Note (Signed)
History and Physical Interval Note: ? ?04/09/2021 ?8:50 AM ? ?Kelsey Mueller  has presented today for surgery, with the diagnosis of SYMPTOMATIC CHOLELITHIASIS.  The various methods of treatment have been discussed with the patient and family. After consideration of risks, benefits and other options for treatment, the patient has consented to  Procedure(s): ?LAPAROSCOPIC CHOLECYSTECTOMY WITH INTRAOPERATIVE CHOLANGIOGRAM WITH ICG DYE (N/A) ?INDOCYANINE GREEN FLUORESCENCE IMAGING (ICG) (N/A) as a surgical intervention.  The patient's history has been reviewed, patient examined, no change in status, stable for surgery.  I have reviewed the patient's chart and labs.  Questions were answered to the patient's satisfaction.   ? ? ?Greer Pickerel ? ? ?

## 2021-04-10 ENCOUNTER — Encounter (HOSPITAL_COMMUNITY): Payer: Self-pay | Admitting: General Surgery

## 2021-04-10 LAB — SURGICAL PATHOLOGY

## 2021-04-19 DIAGNOSIS — M7062 Trochanteric bursitis, left hip: Secondary | ICD-10-CM | POA: Diagnosis not present

## 2021-04-19 DIAGNOSIS — M7061 Trochanteric bursitis, right hip: Secondary | ICD-10-CM | POA: Diagnosis not present

## 2021-04-19 DIAGNOSIS — M25551 Pain in right hip: Secondary | ICD-10-CM | POA: Diagnosis not present

## 2021-04-19 DIAGNOSIS — M25552 Pain in left hip: Secondary | ICD-10-CM | POA: Diagnosis not present

## 2021-04-30 DIAGNOSIS — M810 Age-related osteoporosis without current pathological fracture: Secondary | ICD-10-CM | POA: Diagnosis not present

## 2021-05-03 DIAGNOSIS — M810 Age-related osteoporosis without current pathological fracture: Secondary | ICD-10-CM | POA: Diagnosis not present

## 2021-05-15 DIAGNOSIS — L720 Epidermal cyst: Secondary | ICD-10-CM | POA: Diagnosis not present

## 2021-05-15 DIAGNOSIS — L578 Other skin changes due to chronic exposure to nonionizing radiation: Secondary | ICD-10-CM | POA: Diagnosis not present

## 2021-05-15 DIAGNOSIS — L821 Other seborrheic keratosis: Secondary | ICD-10-CM | POA: Diagnosis not present

## 2021-05-15 DIAGNOSIS — L820 Inflamed seborrheic keratosis: Secondary | ICD-10-CM | POA: Diagnosis not present

## 2021-05-15 DIAGNOSIS — D2262 Melanocytic nevi of left upper limb, including shoulder: Secondary | ICD-10-CM | POA: Diagnosis not present

## 2021-05-15 DIAGNOSIS — L814 Other melanin hyperpigmentation: Secondary | ICD-10-CM | POA: Diagnosis not present

## 2021-05-31 DIAGNOSIS — M7061 Trochanteric bursitis, right hip: Secondary | ICD-10-CM | POA: Diagnosis not present

## 2021-05-31 DIAGNOSIS — M7062 Trochanteric bursitis, left hip: Secondary | ICD-10-CM | POA: Diagnosis not present

## 2021-06-06 DIAGNOSIS — I10 Essential (primary) hypertension: Secondary | ICD-10-CM | POA: Diagnosis not present

## 2021-06-06 DIAGNOSIS — E785 Hyperlipidemia, unspecified: Secondary | ICD-10-CM | POA: Diagnosis not present

## 2021-06-06 DIAGNOSIS — F3342 Major depressive disorder, recurrent, in full remission: Secondary | ICD-10-CM | POA: Diagnosis not present

## 2021-06-06 DIAGNOSIS — I129 Hypertensive chronic kidney disease with stage 1 through stage 4 chronic kidney disease, or unspecified chronic kidney disease: Secondary | ICD-10-CM | POA: Diagnosis not present

## 2021-06-21 DIAGNOSIS — D692 Other nonthrombocytopenic purpura: Secondary | ICD-10-CM | POA: Diagnosis not present

## 2021-06-21 DIAGNOSIS — L810 Postinflammatory hyperpigmentation: Secondary | ICD-10-CM | POA: Diagnosis not present

## 2021-06-21 DIAGNOSIS — L820 Inflamed seborrheic keratosis: Secondary | ICD-10-CM | POA: Diagnosis not present

## 2021-07-04 DIAGNOSIS — G3184 Mild cognitive impairment, so stated: Secondary | ICD-10-CM | POA: Diagnosis not present

## 2021-07-04 DIAGNOSIS — M7061 Trochanteric bursitis, right hip: Secondary | ICD-10-CM | POA: Diagnosis not present

## 2021-07-04 DIAGNOSIS — R197 Diarrhea, unspecified: Secondary | ICD-10-CM | POA: Diagnosis not present

## 2021-07-06 DIAGNOSIS — Z96651 Presence of right artificial knee joint: Secondary | ICD-10-CM | POA: Diagnosis not present

## 2021-07-17 DIAGNOSIS — M25661 Stiffness of right knee, not elsewhere classified: Secondary | ICD-10-CM | POA: Diagnosis not present

## 2021-07-18 DIAGNOSIS — M25552 Pain in left hip: Secondary | ICD-10-CM | POA: Diagnosis not present

## 2021-07-18 DIAGNOSIS — M7062 Trochanteric bursitis, left hip: Secondary | ICD-10-CM | POA: Diagnosis not present

## 2021-07-18 DIAGNOSIS — M25551 Pain in right hip: Secondary | ICD-10-CM | POA: Diagnosis not present

## 2021-07-18 DIAGNOSIS — M7061 Trochanteric bursitis, right hip: Secondary | ICD-10-CM | POA: Diagnosis not present

## 2021-07-23 DIAGNOSIS — M25661 Stiffness of right knee, not elsewhere classified: Secondary | ICD-10-CM | POA: Diagnosis not present

## 2021-07-25 DIAGNOSIS — M25661 Stiffness of right knee, not elsewhere classified: Secondary | ICD-10-CM | POA: Diagnosis not present

## 2021-07-27 DIAGNOSIS — M5416 Radiculopathy, lumbar region: Secondary | ICD-10-CM | POA: Diagnosis not present

## 2021-08-07 DIAGNOSIS — R1031 Right lower quadrant pain: Secondary | ICD-10-CM | POA: Diagnosis not present

## 2021-08-08 ENCOUNTER — Other Ambulatory Visit: Payer: Self-pay | Admitting: Internal Medicine

## 2021-08-08 DIAGNOSIS — R1031 Right lower quadrant pain: Secondary | ICD-10-CM

## 2021-08-13 DIAGNOSIS — L570 Actinic keratosis: Secondary | ICD-10-CM | POA: Diagnosis not present

## 2021-08-13 DIAGNOSIS — L820 Inflamed seborrheic keratosis: Secondary | ICD-10-CM | POA: Diagnosis not present

## 2021-08-13 DIAGNOSIS — D692 Other nonthrombocytopenic purpura: Secondary | ICD-10-CM | POA: Diagnosis not present

## 2021-08-17 DIAGNOSIS — Z96651 Presence of right artificial knee joint: Secondary | ICD-10-CM | POA: Diagnosis not present

## 2021-08-21 DIAGNOSIS — M25661 Stiffness of right knee, not elsewhere classified: Secondary | ICD-10-CM | POA: Diagnosis not present

## 2021-08-23 ENCOUNTER — Ambulatory Visit
Admission: RE | Admit: 2021-08-23 | Discharge: 2021-08-23 | Disposition: A | Discharge status: Home / Self Care | Payer: PPO | Source: Ambulatory Visit | Attending: Internal Medicine | Admitting: Internal Medicine

## 2021-08-23 DIAGNOSIS — K449 Diaphragmatic hernia without obstruction or gangrene: Secondary | ICD-10-CM | POA: Diagnosis not present

## 2021-08-23 DIAGNOSIS — R1031 Right lower quadrant pain: Secondary | ICD-10-CM

## 2021-08-23 DIAGNOSIS — K6389 Other specified diseases of intestine: Secondary | ICD-10-CM | POA: Diagnosis not present

## 2021-08-23 DIAGNOSIS — K3189 Other diseases of stomach and duodenum: Secondary | ICD-10-CM | POA: Diagnosis not present

## 2021-08-23 DIAGNOSIS — N281 Cyst of kidney, acquired: Secondary | ICD-10-CM | POA: Diagnosis not present

## 2021-08-23 MED ORDER — IOPAMIDOL (ISOVUE-300) INJECTION 61%
100.0000 mL | Freq: Once | INTRAVENOUS | Status: AC | PRN
  Administered 2021-08-23: 100 mL via INTRAVENOUS

## 2021-08-29 DIAGNOSIS — M48062 Spinal stenosis, lumbar region with neurogenic claudication: Secondary | ICD-10-CM | POA: Diagnosis not present

## 2021-08-29 DIAGNOSIS — M5459 Other low back pain: Secondary | ICD-10-CM | POA: Diagnosis not present

## 2021-08-29 DIAGNOSIS — M25551 Pain in right hip: Secondary | ICD-10-CM | POA: Diagnosis not present

## 2021-08-29 DIAGNOSIS — M25552 Pain in left hip: Secondary | ICD-10-CM | POA: Diagnosis not present

## 2021-08-30 DIAGNOSIS — M25661 Stiffness of right knee, not elsewhere classified: Secondary | ICD-10-CM | POA: Diagnosis not present

## 2021-10-15 DIAGNOSIS — D485 Neoplasm of uncertain behavior of skin: Secondary | ICD-10-CM | POA: Diagnosis not present

## 2021-10-15 DIAGNOSIS — L570 Actinic keratosis: Secondary | ICD-10-CM | POA: Diagnosis not present

## 2021-10-15 DIAGNOSIS — I10 Essential (primary) hypertension: Secondary | ICD-10-CM | POA: Diagnosis not present

## 2021-10-15 DIAGNOSIS — R7989 Other specified abnormal findings of blood chemistry: Secondary | ICD-10-CM | POA: Diagnosis not present

## 2021-10-15 DIAGNOSIS — C44722 Squamous cell carcinoma of skin of right lower limb, including hip: Secondary | ICD-10-CM | POA: Diagnosis not present

## 2021-10-15 DIAGNOSIS — L820 Inflamed seborrheic keratosis: Secondary | ICD-10-CM | POA: Diagnosis not present

## 2021-10-15 DIAGNOSIS — E785 Hyperlipidemia, unspecified: Secondary | ICD-10-CM | POA: Diagnosis not present

## 2021-10-18 DIAGNOSIS — E611 Iron deficiency: Secondary | ICD-10-CM | POA: Diagnosis not present

## 2021-10-19 DIAGNOSIS — Z1339 Encounter for screening examination for other mental health and behavioral disorders: Secondary | ICD-10-CM | POA: Diagnosis not present

## 2021-10-19 DIAGNOSIS — F3342 Major depressive disorder, recurrent, in full remission: Secondary | ICD-10-CM | POA: Diagnosis not present

## 2021-10-19 DIAGNOSIS — E785 Hyperlipidemia, unspecified: Secondary | ICD-10-CM | POA: Diagnosis not present

## 2021-10-19 DIAGNOSIS — I700 Atherosclerosis of aorta: Secondary | ICD-10-CM | POA: Diagnosis not present

## 2021-10-19 DIAGNOSIS — M5416 Radiculopathy, lumbar region: Secondary | ICD-10-CM | POA: Diagnosis not present

## 2021-10-19 DIAGNOSIS — R109 Unspecified abdominal pain: Secondary | ICD-10-CM | POA: Diagnosis not present

## 2021-10-19 DIAGNOSIS — G3184 Mild cognitive impairment, so stated: Secondary | ICD-10-CM | POA: Diagnosis not present

## 2021-10-19 DIAGNOSIS — M810 Age-related osteoporosis without current pathological fracture: Secondary | ICD-10-CM | POA: Diagnosis not present

## 2021-10-19 DIAGNOSIS — K2270 Barrett's esophagus without dysplasia: Secondary | ICD-10-CM | POA: Diagnosis not present

## 2021-10-19 DIAGNOSIS — I129 Hypertensive chronic kidney disease with stage 1 through stage 4 chronic kidney disease, or unspecified chronic kidney disease: Secondary | ICD-10-CM | POA: Diagnosis not present

## 2021-10-19 DIAGNOSIS — I10 Essential (primary) hypertension: Secondary | ICD-10-CM | POA: Diagnosis not present

## 2021-10-19 DIAGNOSIS — R82998 Other abnormal findings in urine: Secondary | ICD-10-CM | POA: Diagnosis not present

## 2021-10-19 DIAGNOSIS — Z1331 Encounter for screening for depression: Secondary | ICD-10-CM | POA: Diagnosis not present

## 2021-10-19 DIAGNOSIS — Z23 Encounter for immunization: Secondary | ICD-10-CM | POA: Diagnosis not present

## 2021-10-19 DIAGNOSIS — N1831 Chronic kidney disease, stage 3a: Secondary | ICD-10-CM | POA: Diagnosis not present

## 2021-10-19 DIAGNOSIS — Z8673 Personal history of transient ischemic attack (TIA), and cerebral infarction without residual deficits: Secondary | ICD-10-CM | POA: Diagnosis not present

## 2021-10-19 DIAGNOSIS — Z0000 Encounter for general adult medical examination without abnormal findings: Secondary | ICD-10-CM | POA: Diagnosis not present

## 2021-11-05 DIAGNOSIS — M810 Age-related osteoporosis without current pathological fracture: Secondary | ICD-10-CM | POA: Diagnosis not present

## 2021-11-12 DIAGNOSIS — C44722 Squamous cell carcinoma of skin of right lower limb, including hip: Secondary | ICD-10-CM | POA: Diagnosis not present

## 2021-11-13 ENCOUNTER — Other Ambulatory Visit: Payer: Self-pay | Admitting: Internal Medicine

## 2021-11-13 DIAGNOSIS — Z1231 Encounter for screening mammogram for malignant neoplasm of breast: Secondary | ICD-10-CM

## 2021-11-20 DIAGNOSIS — M5416 Radiculopathy, lumbar region: Secondary | ICD-10-CM | POA: Diagnosis not present

## 2021-11-21 DIAGNOSIS — M48062 Spinal stenosis, lumbar region with neurogenic claudication: Secondary | ICD-10-CM | POA: Diagnosis not present

## 2021-11-21 DIAGNOSIS — M25551 Pain in right hip: Secondary | ICD-10-CM | POA: Diagnosis not present

## 2021-11-21 DIAGNOSIS — M5459 Other low back pain: Secondary | ICD-10-CM | POA: Diagnosis not present

## 2021-11-21 DIAGNOSIS — M25552 Pain in left hip: Secondary | ICD-10-CM | POA: Diagnosis not present

## 2021-12-04 DIAGNOSIS — M48062 Spinal stenosis, lumbar region with neurogenic claudication: Secondary | ICD-10-CM | POA: Diagnosis not present

## 2021-12-04 DIAGNOSIS — M5416 Radiculopathy, lumbar region: Secondary | ICD-10-CM | POA: Diagnosis not present

## 2021-12-04 DIAGNOSIS — M5459 Other low back pain: Secondary | ICD-10-CM | POA: Diagnosis not present

## 2021-12-04 DIAGNOSIS — M5451 Vertebrogenic low back pain: Secondary | ICD-10-CM | POA: Diagnosis not present

## 2021-12-04 DIAGNOSIS — R109 Unspecified abdominal pain: Secondary | ICD-10-CM | POA: Diagnosis not present

## 2021-12-10 DIAGNOSIS — L578 Other skin changes due to chronic exposure to nonionizing radiation: Secondary | ICD-10-CM | POA: Diagnosis not present

## 2021-12-10 DIAGNOSIS — Z5189 Encounter for other specified aftercare: Secondary | ICD-10-CM | POA: Diagnosis not present

## 2021-12-10 DIAGNOSIS — L570 Actinic keratosis: Secondary | ICD-10-CM | POA: Diagnosis not present

## 2021-12-24 DIAGNOSIS — R55 Syncope and collapse: Secondary | ICD-10-CM | POA: Diagnosis not present

## 2021-12-24 DIAGNOSIS — R918 Other nonspecific abnormal finding of lung field: Secondary | ICD-10-CM | POA: Diagnosis not present

## 2021-12-24 DIAGNOSIS — R197 Diarrhea, unspecified: Secondary | ICD-10-CM | POA: Diagnosis not present

## 2021-12-24 DIAGNOSIS — R1111 Vomiting without nausea: Secondary | ICD-10-CM | POA: Diagnosis not present

## 2021-12-24 DIAGNOSIS — E860 Dehydration: Secondary | ICD-10-CM | POA: Diagnosis not present

## 2021-12-24 DIAGNOSIS — R001 Bradycardia, unspecified: Secondary | ICD-10-CM | POA: Diagnosis not present

## 2021-12-24 DIAGNOSIS — I10 Essential (primary) hypertension: Secondary | ICD-10-CM | POA: Diagnosis not present

## 2021-12-28 DIAGNOSIS — R55 Syncope and collapse: Secondary | ICD-10-CM | POA: Diagnosis not present

## 2021-12-28 DIAGNOSIS — I129 Hypertensive chronic kidney disease with stage 1 through stage 4 chronic kidney disease, or unspecified chronic kidney disease: Secondary | ICD-10-CM | POA: Diagnosis not present

## 2021-12-28 DIAGNOSIS — N1831 Chronic kidney disease, stage 3a: Secondary | ICD-10-CM | POA: Diagnosis not present

## 2021-12-28 DIAGNOSIS — R197 Diarrhea, unspecified: Secondary | ICD-10-CM | POA: Diagnosis not present

## 2021-12-28 DIAGNOSIS — F3342 Major depressive disorder, recurrent, in full remission: Secondary | ICD-10-CM | POA: Diagnosis not present

## 2022-01-02 DIAGNOSIS — L905 Scar conditions and fibrosis of skin: Secondary | ICD-10-CM | POA: Diagnosis not present

## 2022-01-02 DIAGNOSIS — L820 Inflamed seborrheic keratosis: Secondary | ICD-10-CM | POA: Diagnosis not present

## 2022-01-14 ENCOUNTER — Ambulatory Visit: Payer: PPO

## 2022-01-18 ENCOUNTER — Ambulatory Visit
Admission: RE | Admit: 2022-01-18 | Discharge: 2022-01-18 | Disposition: A | Discharge status: Home / Self Care | Payer: PPO | Source: Ambulatory Visit | Attending: Internal Medicine | Admitting: Internal Medicine

## 2022-01-18 DIAGNOSIS — Z1231 Encounter for screening mammogram for malignant neoplasm of breast: Secondary | ICD-10-CM | POA: Diagnosis not present

## 2022-02-07 DIAGNOSIS — M48062 Spinal stenosis, lumbar region with neurogenic claudication: Secondary | ICD-10-CM | POA: Diagnosis not present

## 2022-02-07 DIAGNOSIS — M5451 Vertebrogenic low back pain: Secondary | ICD-10-CM | POA: Diagnosis not present

## 2022-02-07 DIAGNOSIS — M5416 Radiculopathy, lumbar region: Secondary | ICD-10-CM | POA: Diagnosis not present

## 2022-02-12 DIAGNOSIS — M47816 Spondylosis without myelopathy or radiculopathy, lumbar region: Secondary | ICD-10-CM | POA: Diagnosis not present

## 2022-02-21 DIAGNOSIS — R197 Diarrhea, unspecified: Secondary | ICD-10-CM | POA: Diagnosis not present

## 2022-02-21 DIAGNOSIS — M7062 Trochanteric bursitis, left hip: Secondary | ICD-10-CM | POA: Diagnosis not present

## 2022-02-21 DIAGNOSIS — M545 Low back pain, unspecified: Secondary | ICD-10-CM | POA: Diagnosis not present

## 2022-02-21 DIAGNOSIS — M7061 Trochanteric bursitis, right hip: Secondary | ICD-10-CM | POA: Diagnosis not present

## 2022-02-21 DIAGNOSIS — E611 Iron deficiency: Secondary | ICD-10-CM | POA: Diagnosis not present

## 2022-03-05 DIAGNOSIS — M5459 Other low back pain: Secondary | ICD-10-CM | POA: Diagnosis not present

## 2022-03-12 DIAGNOSIS — M5459 Other low back pain: Secondary | ICD-10-CM | POA: Diagnosis not present

## 2022-03-12 DIAGNOSIS — R109 Unspecified abdominal pain: Secondary | ICD-10-CM | POA: Diagnosis not present

## 2022-03-19 DIAGNOSIS — N390 Urinary tract infection, site not specified: Secondary | ICD-10-CM | POA: Diagnosis not present

## 2022-03-20 DIAGNOSIS — R109 Unspecified abdominal pain: Secondary | ICD-10-CM | POA: Diagnosis not present

## 2022-03-27 DIAGNOSIS — D485 Neoplasm of uncertain behavior of skin: Secondary | ICD-10-CM | POA: Diagnosis not present

## 2022-03-27 DIAGNOSIS — D0461 Carcinoma in situ of skin of right upper limb, including shoulder: Secondary | ICD-10-CM | POA: Diagnosis not present

## 2022-04-04 DIAGNOSIS — R109 Unspecified abdominal pain: Secondary | ICD-10-CM | POA: Diagnosis not present

## 2022-04-09 DIAGNOSIS — G5781 Other specified mononeuropathies of right lower limb: Secondary | ICD-10-CM | POA: Diagnosis not present

## 2022-04-09 DIAGNOSIS — R109 Unspecified abdominal pain: Secondary | ICD-10-CM | POA: Diagnosis not present

## 2022-04-15 DIAGNOSIS — M7061 Trochanteric bursitis, right hip: Secondary | ICD-10-CM | POA: Diagnosis not present

## 2022-04-15 DIAGNOSIS — M7062 Trochanteric bursitis, left hip: Secondary | ICD-10-CM | POA: Diagnosis not present

## 2022-05-01 DIAGNOSIS — H52203 Unspecified astigmatism, bilateral: Secondary | ICD-10-CM | POA: Diagnosis not present

## 2022-05-01 DIAGNOSIS — Z961 Presence of intraocular lens: Secondary | ICD-10-CM | POA: Diagnosis not present

## 2022-05-09 DIAGNOSIS — M810 Age-related osteoporosis without current pathological fracture: Secondary | ICD-10-CM | POA: Diagnosis not present

## 2022-05-10 DIAGNOSIS — M810 Age-related osteoporosis without current pathological fracture: Secondary | ICD-10-CM | POA: Diagnosis not present

## 2022-05-16 DIAGNOSIS — L281 Prurigo nodularis: Secondary | ICD-10-CM | POA: Diagnosis not present

## 2022-05-16 DIAGNOSIS — Z85828 Personal history of other malignant neoplasm of skin: Secondary | ICD-10-CM | POA: Diagnosis not present

## 2022-05-16 DIAGNOSIS — J029 Acute pharyngitis, unspecified: Secondary | ICD-10-CM | POA: Diagnosis not present

## 2022-05-16 DIAGNOSIS — D0439 Carcinoma in situ of skin of other parts of face: Secondary | ICD-10-CM | POA: Diagnosis not present

## 2022-05-16 DIAGNOSIS — H6591 Unspecified nonsuppurative otitis media, right ear: Secondary | ICD-10-CM | POA: Diagnosis not present

## 2022-05-16 DIAGNOSIS — L821 Other seborrheic keratosis: Secondary | ICD-10-CM | POA: Diagnosis not present

## 2022-05-16 DIAGNOSIS — L578 Other skin changes due to chronic exposure to nonionizing radiation: Secondary | ICD-10-CM | POA: Diagnosis not present

## 2022-05-16 DIAGNOSIS — L570 Actinic keratosis: Secondary | ICD-10-CM | POA: Diagnosis not present

## 2022-05-16 DIAGNOSIS — L910 Hypertrophic scar: Secondary | ICD-10-CM | POA: Diagnosis not present

## 2022-05-16 DIAGNOSIS — Z1152 Encounter for screening for COVID-19: Secondary | ICD-10-CM | POA: Diagnosis not present

## 2022-05-16 DIAGNOSIS — D2262 Melanocytic nevi of left upper limb, including shoulder: Secondary | ICD-10-CM | POA: Diagnosis not present

## 2022-05-16 DIAGNOSIS — D485 Neoplasm of uncertain behavior of skin: Secondary | ICD-10-CM | POA: Diagnosis not present

## 2022-05-16 DIAGNOSIS — L814 Other melanin hyperpigmentation: Secondary | ICD-10-CM | POA: Diagnosis not present

## 2022-07-22 DIAGNOSIS — C44329 Squamous cell carcinoma of skin of other parts of face: Secondary | ICD-10-CM | POA: Diagnosis not present

## 2022-08-08 DIAGNOSIS — M545 Low back pain, unspecified: Secondary | ICD-10-CM | POA: Diagnosis not present

## 2022-08-08 DIAGNOSIS — K529 Noninfective gastroenteritis and colitis, unspecified: Secondary | ICD-10-CM | POA: Diagnosis not present

## 2022-08-09 DIAGNOSIS — L281 Prurigo nodularis: Secondary | ICD-10-CM | POA: Diagnosis not present

## 2022-08-09 DIAGNOSIS — D485 Neoplasm of uncertain behavior of skin: Secondary | ICD-10-CM | POA: Diagnosis not present

## 2022-08-20 DIAGNOSIS — L281 Prurigo nodularis: Secondary | ICD-10-CM | POA: Diagnosis not present

## 2022-10-01 DIAGNOSIS — D485 Neoplasm of uncertain behavior of skin: Secondary | ICD-10-CM | POA: Diagnosis not present

## 2022-10-01 DIAGNOSIS — L281 Prurigo nodularis: Secondary | ICD-10-CM | POA: Diagnosis not present

## 2022-11-07 DIAGNOSIS — Z961 Presence of intraocular lens: Secondary | ICD-10-CM | POA: Diagnosis not present

## 2022-11-11 DIAGNOSIS — M810 Age-related osteoporosis without current pathological fracture: Secondary | ICD-10-CM | POA: Diagnosis not present

## 2022-11-13 DIAGNOSIS — M810 Age-related osteoporosis without current pathological fracture: Secondary | ICD-10-CM | POA: Diagnosis not present

## 2022-11-18 DIAGNOSIS — I129 Hypertensive chronic kidney disease with stage 1 through stage 4 chronic kidney disease, or unspecified chronic kidney disease: Secondary | ICD-10-CM | POA: Diagnosis not present

## 2022-11-18 DIAGNOSIS — M810 Age-related osteoporosis without current pathological fracture: Secondary | ICD-10-CM | POA: Diagnosis not present

## 2022-11-18 DIAGNOSIS — N1831 Chronic kidney disease, stage 3a: Secondary | ICD-10-CM | POA: Diagnosis not present

## 2022-11-18 DIAGNOSIS — D649 Anemia, unspecified: Secondary | ICD-10-CM | POA: Diagnosis not present

## 2022-11-18 DIAGNOSIS — E785 Hyperlipidemia, unspecified: Secondary | ICD-10-CM | POA: Diagnosis not present

## 2022-11-22 DIAGNOSIS — F3342 Major depressive disorder, recurrent, in full remission: Secondary | ICD-10-CM | POA: Diagnosis not present

## 2022-11-22 DIAGNOSIS — I10 Essential (primary) hypertension: Secondary | ICD-10-CM | POA: Diagnosis not present

## 2022-11-22 DIAGNOSIS — G319 Degenerative disease of nervous system, unspecified: Secondary | ICD-10-CM | POA: Diagnosis not present

## 2022-11-22 DIAGNOSIS — M545 Low back pain, unspecified: Secondary | ICD-10-CM | POA: Diagnosis not present

## 2022-11-22 DIAGNOSIS — Z8673 Personal history of transient ischemic attack (TIA), and cerebral infarction without residual deficits: Secondary | ICD-10-CM | POA: Diagnosis not present

## 2022-11-22 DIAGNOSIS — N1831 Chronic kidney disease, stage 3a: Secondary | ICD-10-CM | POA: Diagnosis not present

## 2022-11-22 DIAGNOSIS — Z1339 Encounter for screening examination for other mental health and behavioral disorders: Secondary | ICD-10-CM | POA: Diagnosis not present

## 2022-11-22 DIAGNOSIS — I129 Hypertensive chronic kidney disease with stage 1 through stage 4 chronic kidney disease, or unspecified chronic kidney disease: Secondary | ICD-10-CM | POA: Diagnosis not present

## 2022-11-22 DIAGNOSIS — T8484XA Pain due to internal orthopedic prosthetic devices, implants and grafts, initial encounter: Secondary | ICD-10-CM | POA: Diagnosis not present

## 2022-11-22 DIAGNOSIS — G3184 Mild cognitive impairment, so stated: Secondary | ICD-10-CM | POA: Diagnosis not present

## 2022-11-22 DIAGNOSIS — Z0000 Encounter for general adult medical examination without abnormal findings: Secondary | ICD-10-CM | POA: Diagnosis not present

## 2022-11-22 DIAGNOSIS — K219 Gastro-esophageal reflux disease without esophagitis: Secondary | ICD-10-CM | POA: Diagnosis not present

## 2022-11-22 DIAGNOSIS — M810 Age-related osteoporosis without current pathological fracture: Secondary | ICD-10-CM | POA: Diagnosis not present

## 2022-11-22 DIAGNOSIS — R82998 Other abnormal findings in urine: Secondary | ICD-10-CM | POA: Diagnosis not present

## 2022-11-22 DIAGNOSIS — E785 Hyperlipidemia, unspecified: Secondary | ICD-10-CM | POA: Diagnosis not present

## 2022-11-22 DIAGNOSIS — I700 Atherosclerosis of aorta: Secondary | ICD-10-CM | POA: Diagnosis not present

## 2022-11-22 DIAGNOSIS — G250 Essential tremor: Secondary | ICD-10-CM | POA: Diagnosis not present

## 2022-11-22 DIAGNOSIS — Z1331 Encounter for screening for depression: Secondary | ICD-10-CM | POA: Diagnosis not present

## 2022-11-22 DIAGNOSIS — Z23 Encounter for immunization: Secondary | ICD-10-CM | POA: Diagnosis not present

## 2022-12-03 DIAGNOSIS — M8588 Other specified disorders of bone density and structure, other site: Secondary | ICD-10-CM | POA: Diagnosis not present

## 2022-12-03 DIAGNOSIS — Z853 Personal history of malignant neoplasm of breast: Secondary | ICD-10-CM | POA: Diagnosis not present

## 2022-12-03 DIAGNOSIS — Z8262 Family history of osteoporosis: Secondary | ICD-10-CM | POA: Diagnosis not present

## 2022-12-13 DIAGNOSIS — M7062 Trochanteric bursitis, left hip: Secondary | ICD-10-CM | POA: Diagnosis not present

## 2022-12-13 DIAGNOSIS — M7061 Trochanteric bursitis, right hip: Secondary | ICD-10-CM | POA: Diagnosis not present

## 2023-02-17 DIAGNOSIS — M7062 Trochanteric bursitis, left hip: Secondary | ICD-10-CM | POA: Diagnosis not present

## 2023-02-17 DIAGNOSIS — M7061 Trochanteric bursitis, right hip: Secondary | ICD-10-CM | POA: Diagnosis not present

## 2023-03-11 DIAGNOSIS — M7061 Trochanteric bursitis, right hip: Secondary | ICD-10-CM | POA: Diagnosis not present

## 2023-03-11 DIAGNOSIS — M25551 Pain in right hip: Secondary | ICD-10-CM | POA: Diagnosis not present

## 2023-03-13 DIAGNOSIS — M25551 Pain in right hip: Secondary | ICD-10-CM | POA: Diagnosis not present

## 2023-03-13 DIAGNOSIS — M7061 Trochanteric bursitis, right hip: Secondary | ICD-10-CM | POA: Diagnosis not present

## 2023-03-16 IMAGING — MR MR HEAD WO/W CM
13 series · 48 of 48 positions shown · IV contrast (multihance)
Comparison: None.

CLINICAL DATA: Mild cognitive impairment.

EXAM:
MRI HEAD WITHOUT AND WITH CONTRAST
TECHNIQUE: Multiplanar, multiecho pulse sequences of the brain and surrounding
structures were obtained without and with intravenous contrast.
CONTRAST:  10mL MULTIHANCE GADOBENATE DIMEGLUMINE 529 MG/ML IV SOLN

[Series 2: T1 · sagittal · 5.0mm · 0.45mm/px · 1 of 22 slices shown]
[im 1/22]
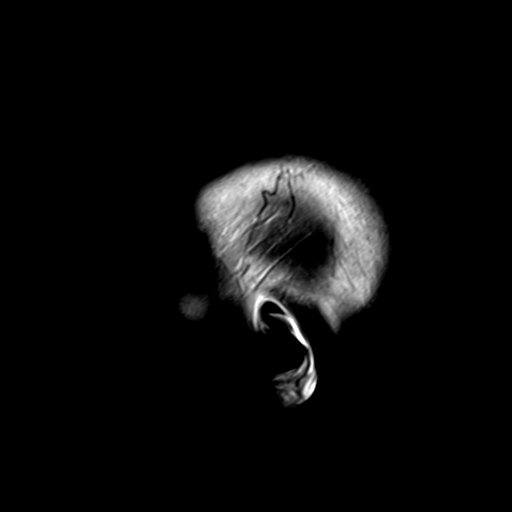

[Series 3: ax ep2d_diff_3 · axial · 3.0mm · 1.80mm/px · z∈[-65,+82]mm · 5 of 100 slices shown]
[im 1/100]
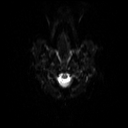
[im 25/100]
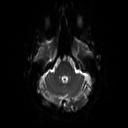
[im 50/100]
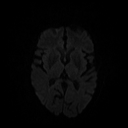
[im 75/100]
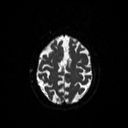
[im 100/100]
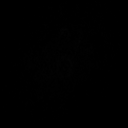

[Series 4: ax ep2d_diff_3_adc · axial · 3.0mm · 1.80mm/px · z∈[-65,+82]mm · 3 of 50 slices shown]
[im 1/50]
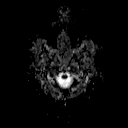
[im 25/50]
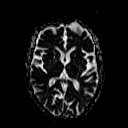
[im 50/50]
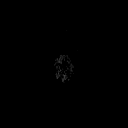

[Series 5: cor ep2d_diff · coronal · 5.0mm · 1.77mm/px · 3 of 53 slices shown]
[im 1/53]
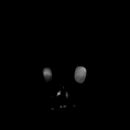
[im 27/53]
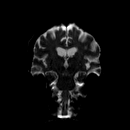
[im 53/53]
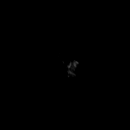

[Series 6: cor ep2d_diff_adc · coronal · 5.0mm · 1.77mm/px · 2 of 27 slices shown]
[im 1/27]
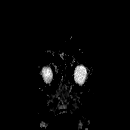
[im 27/27]
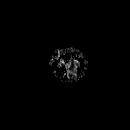

[Series 7: mip_images(sw) · axial · 16.0mm · 0.90mm/px · z∈[-64,+79]mm · 4 of 73 slices shown]
[im 1/73]
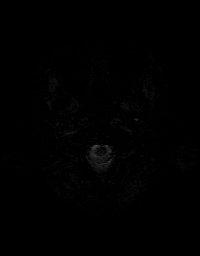
[im 25/73]
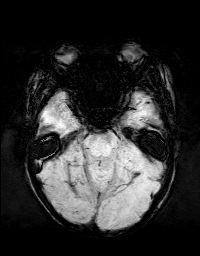
[im 49/73]
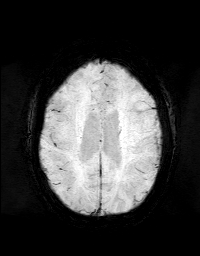
[im 73/73]
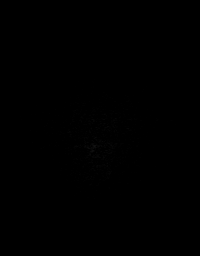

[Series 8: swi_images · axial · 2.0mm · 0.90mm/px · z∈[-71,+86]mm · 5 of 80 slices shown]
[im 1/80]
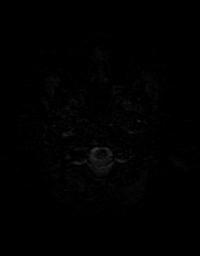
[im 20/80]
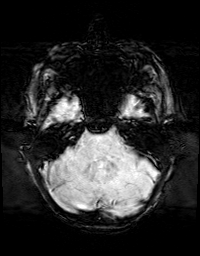
[im 40/80]
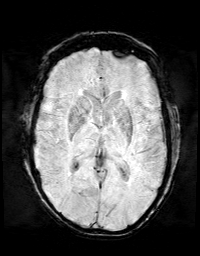
[im 60/80]
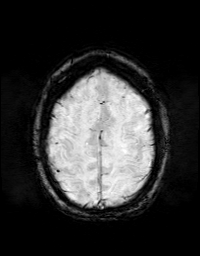
[im 80/80]
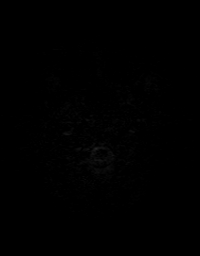

[Series 9: FLAIR · axial · 3.0mm · 0.43mm/px · z∈[-68,+84]mm · 2 of 40 slices shown]
[im 1/40]
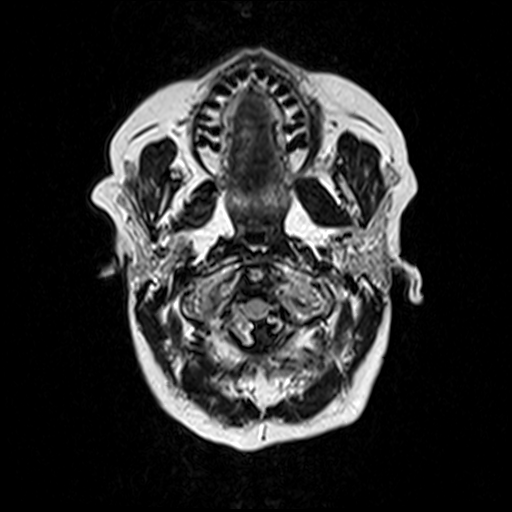
[im 40/40]
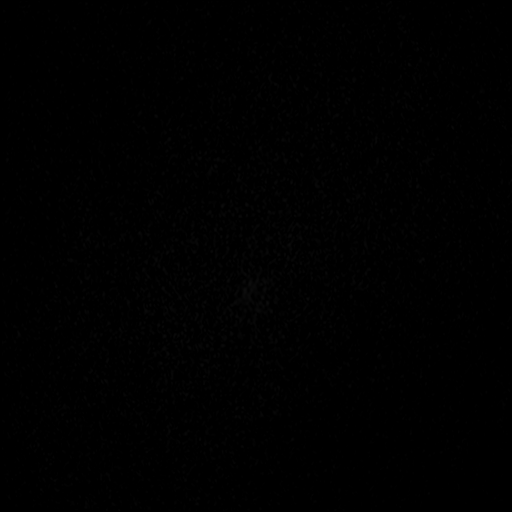

[Series 10: T2 · axial · 5.0mm · 0.60mm/px · 1 of 24 slices shown (1 of 2)]
[im 1/24]
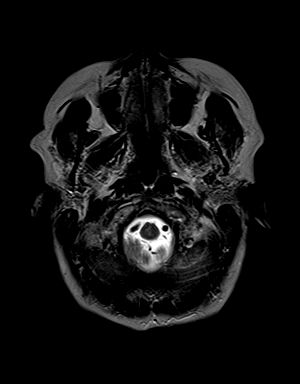

[Series 11: t1_mpr_tra · axial · 1.0mm · 0.72mm/px · z∈[-72,+87]mm · 9 of 160 slices shown]
[im 1/160]
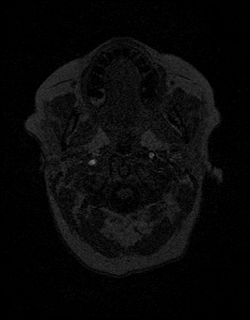
[im 20/160]
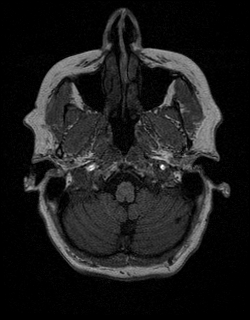
[im 40/160]
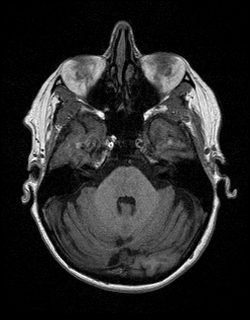
[im 60/160]
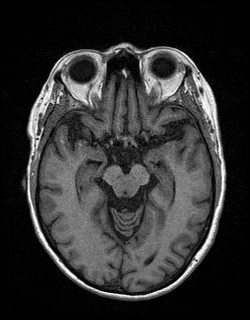
[im 80/160]
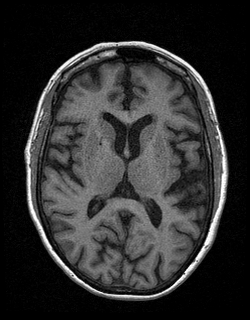
[im 100/160]
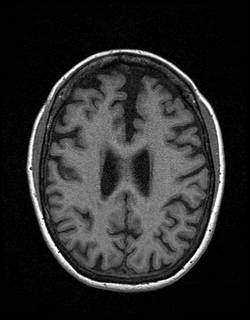
[im 120/160]
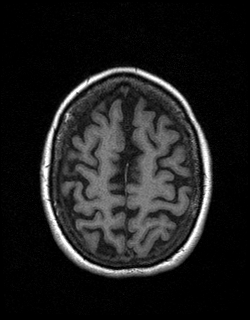
[im 140/160]
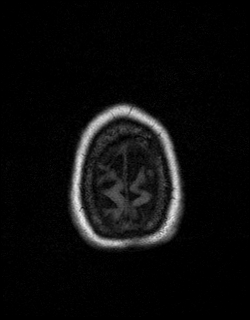
[im 160/160]
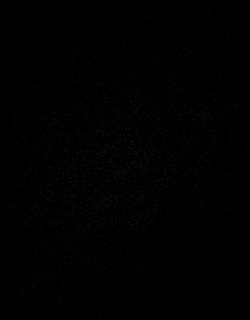

[Series 13: T2 · coronal · 5.0mm · 0.45mm/px · 2 of 31 slices shown (2 of 2)]
[im 1/31]
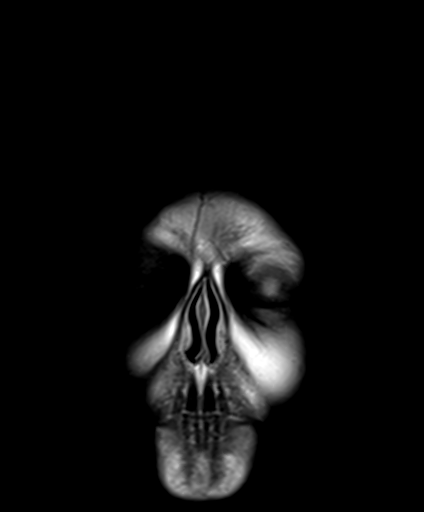
[im 31/31]
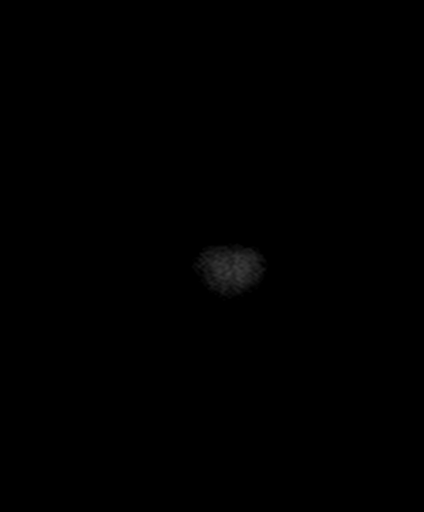

[Series 14: T1 post-contrast · coronal · 5.0mm · 0.72mm/px · 2 of 27 slices shown]
[im 1/27]
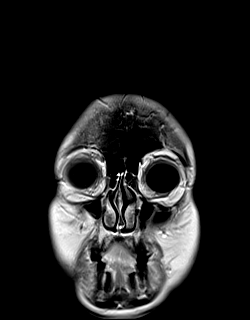
[im 27/27]
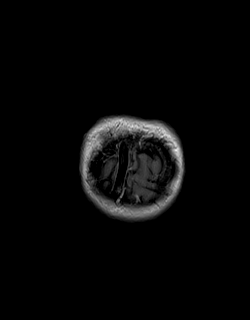

[Series 15: post t1_mpr_tra · axial · 1.0mm · 0.72mm/px · z∈[-72,+87]mm · 9 of 160 slices shown]
[im 1/160]
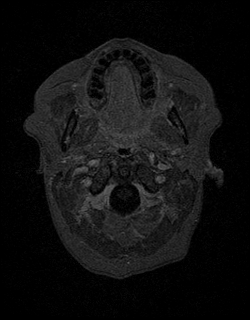
[im 20/160]
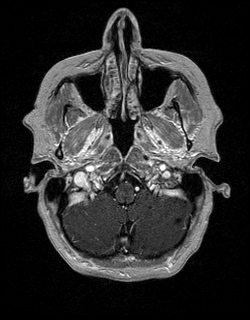
[im 40/160]
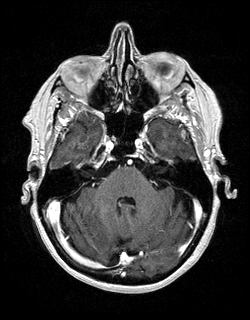
[im 60/160]
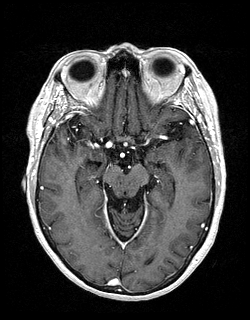
[im 80/160]
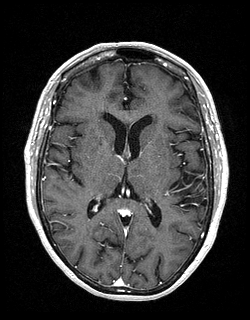
[im 100/160]
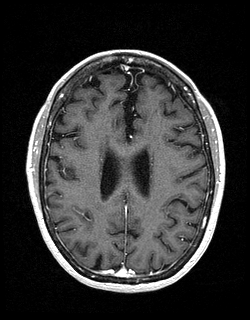
[im 120/160]
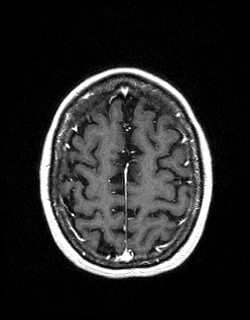
[im 140/160]
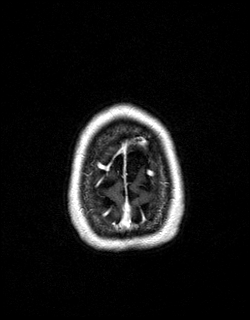
[im 160/160]
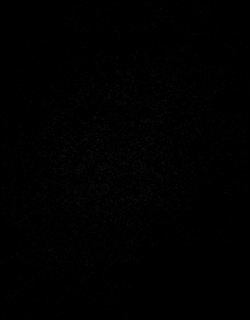

[48 of 48 positions shown; findings below may reference images not displayed]

FINDINGS: Brain: There is no evidence of an acute infarct, intracranial
hemorrhage, mass, midline shift, or extra-axial fluid collection.
There is mild generalized cerebral atrophy. Scattered T2
hyperintensities in the subcortical, deep, and periventricular white
matter bilaterally and in the pons are nonspecific but favored to
reflect chronic small vessel ischemic disease in a patient of this
age rather than demyelinating disease despite the periventricular
involvement. There is a small chronic infarct in the left cerebellar
hemisphere. No abnormal brain parenchymal or meningeal enhancement
is identified. A small developmental venous anomaly is incidentally
noted in the left cerebellum.

Vascular: Major intracranial vascular flow voids are preserved.

Skull and upper cervical spine: Unremarkable bone marrow signal.

Sinuses/Orbits: Bilateral cataract extraction. Paranasal sinuses and
mastoid air cells are clear.

Other: None.
IMPRESSION: 1. No acute intracranial abnormality.
2. Mild chronic small vessel ischemic disease and cerebral atrophy.
3. Small chronic cerebellar infarct.

## 2023-03-18 DIAGNOSIS — M25551 Pain in right hip: Secondary | ICD-10-CM | POA: Diagnosis not present

## 2023-03-18 DIAGNOSIS — M7061 Trochanteric bursitis, right hip: Secondary | ICD-10-CM | POA: Diagnosis not present

## 2023-03-20 DIAGNOSIS — R350 Frequency of micturition: Secondary | ICD-10-CM | POA: Diagnosis not present

## 2023-03-20 DIAGNOSIS — I129 Hypertensive chronic kidney disease with stage 1 through stage 4 chronic kidney disease, or unspecified chronic kidney disease: Secondary | ICD-10-CM | POA: Diagnosis not present

## 2023-03-20 DIAGNOSIS — N390 Urinary tract infection, site not specified: Secondary | ICD-10-CM | POA: Diagnosis not present

## 2023-03-20 DIAGNOSIS — M25551 Pain in right hip: Secondary | ICD-10-CM | POA: Diagnosis not present

## 2023-03-20 DIAGNOSIS — M7061 Trochanteric bursitis, right hip: Secondary | ICD-10-CM | POA: Diagnosis not present

## 2023-03-25 ENCOUNTER — Ambulatory Visit: Payer: PPO | Admitting: Obstetrics and Gynecology

## 2023-03-25 ENCOUNTER — Encounter: Payer: Self-pay | Admitting: Obstetrics and Gynecology

## 2023-03-25 VITALS — BP 112/76 | HR 80 | Temp 98.0°F | Ht 61.81 in | Wt 129.0 lb

## 2023-03-25 DIAGNOSIS — M810 Age-related osteoporosis without current pathological fracture: Secondary | ICD-10-CM

## 2023-03-25 DIAGNOSIS — Z01419 Encounter for gynecological examination (general) (routine) without abnormal findings: Secondary | ICD-10-CM | POA: Diagnosis not present

## 2023-03-25 DIAGNOSIS — Z1331 Encounter for screening for depression: Secondary | ICD-10-CM

## 2023-03-25 DIAGNOSIS — B3731 Acute candidiasis of vulva and vagina: Secondary | ICD-10-CM

## 2023-03-25 DIAGNOSIS — R1031 Right lower quadrant pain: Secondary | ICD-10-CM | POA: Diagnosis not present

## 2023-03-25 DIAGNOSIS — N898 Other specified noninflammatory disorders of vagina: Secondary | ICD-10-CM

## 2023-03-25 DIAGNOSIS — N3941 Urge incontinence: Secondary | ICD-10-CM

## 2023-03-25 DIAGNOSIS — R1011 Right upper quadrant pain: Secondary | ICD-10-CM | POA: Diagnosis not present

## 2023-03-25 LAB — WET PREP FOR TRICH, YEAST, CLUE

## 2023-03-25 MED ORDER — PHENAZOPYRIDINE HCL 200 MG PO TABS
200.0000 mg | ORAL_TABLET | Freq: Three times a day (TID) | ORAL | 0 refills | Status: AC | PRN
Start: 2023-03-25 — End: 2023-03-27

## 2023-03-25 MED ORDER — FLUCONAZOLE 150 MG PO TABS: 150.0000 mg | ORAL_TABLET | Freq: Once | ORAL | 1 refills | Status: AC

## 2023-03-25 NOTE — Progress Notes (Signed)
 78 y.o. G3P3 postmenopausal female with history of right breast cancer (diagnosed 2009 s/p lumpectomy, RT), osteoporosis (managed by rheumatology, started on Prolia in 2019), pelvic organ prolapse, mixed urinary incontinence (on Myrbetriq) here for annual exam. Married.  Pt reports having a UTI last week and was given medication for it but feels like the symptoms are coming back. RLQ pain. No dysuria, frequency, or odor. Did not complete UA/Ucx with PCP.  h/o mixed incontinence, PT didn't help. Myrbetriq helps some.  History of splinting with bowel movements.  Postmenopausal bleeding: none Pelvic discharge or pain: none Breast mass, nipple discharge or skin changes : none Last PAP: No results found for: "DIAGPAP", "HPVHIGH", "ADEQPAP" Last mammogram: 01/18/2022 BI-RADS 1, density B Last colonoscopy: 08/15/2015 Last DXA: 06/06/2017 osteopenia, FRAX 18/3.4%, last Dxa Jan 2025 per patient Sexually active: Yes Exercising: Yes, walks Smoker: No  GYN HISTORY: As noted above  OB History  Gravida Para Term Preterm AB Living  3 3    3   SAB IAB Ectopic Multiple Live Births          # Outcome Date GA Lbr Len/2nd Weight Sex Type Anes PTL Lv  3 Para 1976    F      2 Para 1975    M      1 Para 1971    F        Past Medical History:  Diagnosis Date   Allergic rhinoconjunctivitis    Anemia    on meds   Arthritis    on med   Asthma    very mild   Breast cancer (HCC)    RIGHT =s/p XRT and lumpectomy 2009   Cataract    bilateral sx   Depression    on meds   Environmental and seasonal allergies    GERD (gastroesophageal reflux disease)    Heart murmur    slight   Hyperlipidemia    on meds   Hypertension    on meds   Infertility    took clomid with second pregnancy   Knee injury 06/13/2012   fell, cracked femur under knee cap-right   Left-sided weakness    left arm due to polio   Migraine    hx of menstrual   Obesity    Osteoporosis    on meds   Personal history of  radiation therapy    Pneumonia    hx of   Polio    age 5 month old-"slight case"   Stomach ulcer    Stroke (HCC)    small stroke per pt per Dr.Shaw - dx 2021    Past Surgical History:  Procedure Laterality Date   BREAST BIOPSY     right breast ER/PR +   BREAST LUMPECTOMY  2009   R. Pierce Crane    CATARACT EXTRACTION, BILATERAL Bilateral 05/2017, 06/2017   CHOLECYSTECTOMY N/A 04/09/2021   Procedure: LAPAROSCOPIC CHOLECYSTECTOMY WITH ICG DYE;  Surgeon: Gaynelle Adu, MD;  Location: [facility];  Service: General;  Laterality: N/A;   COLONOSCOPY  2017   KNEE CLOSED REDUCTION Right 11/04/2016   Procedure: CLOSED MANIPULATION RIGHT KNEE;  Surgeon: Ollen Gross, MD;  Location: [facility];  Service: Orthopedics;  Laterality: Right;   NISSEN FUNDOPLICATION  2005   SKIN CANCER DESTRUCTION     had freezing done to remove pre-cancerpus cells to right cheek    TONSILLECTOMY  78 years old   TOTAL KNEE ARTHROPLASTY Right 11/15/2013   Procedure: RIGHT TOTAL KNEE ARTHROPLASTY;  Surgeon:  Loanne Drilling, MD;  Location: [facility];  Service: Orthopedics;  Laterality: Right;   TUBAL LIGATION  1976   UPPER GASTROINTESTINAL ENDOSCOPY  2019   recall 3 yrs   WISDOM TOOTH EXTRACTION      Current Outpatient Medications on File Prior to Visit  Medication Sig Dispense Refill   amoxicillin (AMOXIL) 500 MG tablet Take 2,000 mg by mouth See admin instructions. Take 4 tablets (2000 mg) by mouth one hour prior to dental procedures     aspirin EC 81 MG tablet Take 81 mg by mouth daily with breakfast. Swallow whole.     BIOTIN PO Take 1 tablet by mouth daily with breakfast.     cetirizine (ZYRTEC) 10 MG tablet Take 10 mg by mouth daily with breakfast.     denosumab (PROLIA) 60 MG/ML SOSY injection Inject 60 mg into the skin every 6 (six) months.     donepezil (ARICEPT) 10 MG tablet Take 10 mg by mouth at bedtime.     DULoxetine (CYMBALTA) 60 MG capsule Take 60 mg by mouth daily with breakfast.     ketoconazole (NIZORAL) 2 %  cream Apply 1 application. topically daily as needed for irritation (eczema).     mirabegron ER (MYRBETRIQ) 25 MG TB24 tablet Take 25 mg by mouth daily with breakfast.     Multiple Vitamin (MULTIVITAMIN WITH MINERALS) TABS tablet Take 1 tablet by mouth daily with breakfast.     olmesartan (BENICAR) 20 MG tablet Take 20 mg by mouth daily with breakfast.     pantoprazole (PROTONIX) 40 MG tablet Take 40 mg by mouth 2 (two) times daily with a meal.     Probiotic Product (ALIGN PO) Take 1 tablet by mouth daily with breakfast.     PSYLLIUM PO Take 1-2 capsules by mouth See admin instructions. Take 2 capsules by mouth with breakfast and lunch, take 2 capsules with supper - as needed for constipation     rosuvastatin (CRESTOR) 10 MG tablet Take 10 mg by mouth daily with breakfast.     triamcinolone cream (KENALOG) 0.1 % Apply 1 application. topically daily. Eczema     zolpidem (AMBIEN) 10 MG tablet Take 10 mg by mouth at bedtime as needed for sleep.     No current facility-administered medications on file prior to visit.    Social History   Socioeconomic History   Marital status: Married    Spouse name: Not on file   Number of children: 3   Years of education: Not on file   Highest education level: Not on file  Occupational History   Occupation: retired  Tobacco Use   Smoking status: Never   Smokeless tobacco: Never  Vaping Use   Vaping status: Never Used  Substance and Sexual Activity   Alcohol use: Not Currently   Drug use: No   Sexual activity: Yes    Partners: Male    Birth control/protection: Post-menopausal    Comment: GCG-MCR HR Hx breast cancer  Other Topics Concern   Not on file  Social History Narrative   Married. 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)    Social Drivers of Corporate investment banker Strain: Not on file  Food Insecurity: Not on file  Transportation Needs: Not on file  Physical Activity: Not on file  Stress: Not on file  Social Connections: Unknown  (05/21/2021)   Received from [facility]   Social Network    Social Network: Not on file  Intimate Partner Violence: Unknown (04/12/2021)  Received from [facility]   HITS    Physically Hurt: Not on file    Insult or Talk Down To: Not on file    Threaten Physical Harm: Not on file    Scream or Curse: Not on file    Family History  Problem Relation Age of Onset   Breast cancer Paternal Aunt        dx in her 84s, died in her 44s   Cancer Maternal Uncle        NOS   Ovarian cancer Maternal Grandmother    Breast cancer Paternal Aunt        dx in her 30s; bilateral breast cancer; double mastectomy   Breast cancer Cousin        bilateral breast cancer in her 73s   Coronary artery disease Neg Hx    Colon cancer Neg Hx    Esophageal cancer Neg Hx    Pancreatic cancer Neg Hx    Stomach cancer Neg Hx    Liver disease Neg Hx    Colon polyps Neg Hx    Rectal cancer Neg Hx     No Known Allergies    PE Today's Vitals   03/25/23 1338  BP: 112/76  Pulse: 80  Temp: 98 F (36.7 C)  TempSrc: Oral  SpO2: 98%  Weight: 129 lb (58.5 kg)  Height: 5' 1.81" (1.57 m)   Body mass index is 23.74 kg/m.  Physical Exam Vitals reviewed. Exam conducted with a chaperone present.  Constitutional:      General: She is not in acute distress.    Appearance: Normal appearance.  HENT:     Head: Normocephalic and atraumatic.     Nose: Nose normal.  Eyes:     Extraocular Movements: Extraocular movements intact.     Conjunctiva/sclera: Conjunctivae normal.  Neck:     Thyroid: No thyroid mass, thyromegaly or thyroid tenderness.  Pulmonary:     Effort: Pulmonary effort is normal.  Chest:     Chest wall: No mass or tenderness.  Breasts:    Right: Normal. No swelling, mass, nipple discharge, skin change or tenderness.     Left: Normal. No swelling, mass, nipple discharge, skin change or tenderness.  Abdominal:     General: There is no distension.      Palpations: Abdomen is soft.     Tenderness: There is no abdominal tenderness.  Genitourinary:    General: Normal vulva.     Exam position: Lithotomy position.     Urethra: No prolapse.     Vagina: Normal. No vaginal discharge or bleeding.     Cervix: Normal. No lesion.     Uterus: Normal. Not enlarged and not tender.      Adnexa: Right adnexa normal and left adnexa normal.     Comments: Kegel 1/5 Musculoskeletal:        General: Normal range of motion.     Cervical back: Normal range of motion.  Lymphadenopathy:     Upper Body:     Right upper body: No axillary adenopathy.     Left upper body: No axillary adenopathy.     Lower Body: No right inguinal adenopathy. No left inguinal adenopathy.  Skin:    General: Skin is warm and dry.  Neurological:     General: No focal deficit present.     Mental Status: She is alert.  Psychiatric:        Mood and Affect: Mood normal.  Behavior: Behavior normal.      Assessment and Plan:        Well woman exam with routine gynecological exam Assessment & Plan: Cervical cancer screening performed according to ASCCP guidelines. Encouraged annual mammogram screening Colonoscopy UTD DXA UTD Labs and immunizations with her primary Encouraged safe sexual practices as indicated Encouraged healthy lifestyle practices with diet and exercise For patients over 70yo, I recommend 1200mg  calcium daily and 800IU of vitamin D daily.  Right lower quadrant abdominal pain ?kidney stone 2/2 hematuria on UA+sx, will await Ucx for treatment. Start pyridium in meantime -     Urinalysis,Complete w/RFL Culture -     Phenazopyridine HCl; Take 1 tablet (200 mg total) by mouth 3 (three) times daily as needed for up to 2 days for pain.  Dispense: 6 tablet; Refill: 0  Vaginal discharge -     WET PREP FOR TRICH, YEAST, CLUE  Yeast vaginitis -     Fluconazole; Take 1 tablet (150 mg total) by mouth once for 1 dose.  Dispense: 1 tablet; Refill: 1  Urge  incontinence of urine Continue kegels and myrbetriq. AUGS H/O provided.  Other orders -     Urine Culture -     REFLEXIVE URINE CULTURE  Osteoporosis Continue care with rheumatology  Rosalyn Gess, MD

## 2023-03-27 ENCOUNTER — Encounter: Payer: Self-pay | Admitting: Obstetrics and Gynecology

## 2023-03-27 DIAGNOSIS — M7061 Trochanteric bursitis, right hip: Secondary | ICD-10-CM | POA: Diagnosis not present

## 2023-03-27 DIAGNOSIS — M25551 Pain in right hip: Secondary | ICD-10-CM | POA: Diagnosis not present

## 2023-03-27 LAB — URINALYSIS, COMPLETE W/RFL CULTURE
Bilirubin Urine: NEGATIVE
Glucose, UA: NEGATIVE
Hyaline Cast: NONE SEEN /LPF
Ketones, ur: NEGATIVE
Nitrites, Initial: NEGATIVE
Specific Gravity, Urine: 1.01 (ref 1.001–1.035)
pH: 5.5 (ref 5.0–8.0)

## 2023-03-27 LAB — CULTURE INDICATED

## 2023-03-27 LAB — URINE CULTURE
MICRO NUMBER:: 16215087
SPECIMEN QUALITY:: ADEQUATE

## 2023-04-03 DIAGNOSIS — M7061 Trochanteric bursitis, right hip: Secondary | ICD-10-CM | POA: Diagnosis not present

## 2023-04-03 DIAGNOSIS — M25551 Pain in right hip: Secondary | ICD-10-CM | POA: Diagnosis not present

## 2023-04-08 DIAGNOSIS — M7061 Trochanteric bursitis, right hip: Secondary | ICD-10-CM | POA: Diagnosis not present

## 2023-04-08 DIAGNOSIS — M25551 Pain in right hip: Secondary | ICD-10-CM | POA: Diagnosis not present

## 2023-04-09 NOTE — Assessment & Plan Note (Signed)
 Cervical cancer screening performed according to ASCCP guidelines. Encouraged annual mammogram screening Colonoscopy UTD DXA UTD Labs and immunizations with her primary Encouraged safe sexual practices as indicated Encouraged healthy lifestyle practices with diet and exercise For patients over 78yo, I recommend 1200mg  calcium daily and 800IU of vitamin D daily.

## 2023-04-09 NOTE — Patient Instructions (Signed)

## 2023-04-17 DIAGNOSIS — M7062 Trochanteric bursitis, left hip: Secondary | ICD-10-CM | POA: Diagnosis not present

## 2023-04-17 DIAGNOSIS — N390 Urinary tract infection, site not specified: Secondary | ICD-10-CM | POA: Diagnosis not present

## 2023-04-17 DIAGNOSIS — M7061 Trochanteric bursitis, right hip: Secondary | ICD-10-CM | POA: Diagnosis not present

## 2023-04-17 DIAGNOSIS — S8001XA Contusion of right knee, initial encounter: Secondary | ICD-10-CM | POA: Diagnosis not present

## 2023-04-17 DIAGNOSIS — S8002XA Contusion of left knee, initial encounter: Secondary | ICD-10-CM | POA: Diagnosis not present

## 2023-05-26 DIAGNOSIS — Z8673 Personal history of transient ischemic attack (TIA), and cerebral infarction without residual deficits: Secondary | ICD-10-CM | POA: Diagnosis not present

## 2023-05-26 DIAGNOSIS — E785 Hyperlipidemia, unspecified: Secondary | ICD-10-CM | POA: Diagnosis not present

## 2023-05-26 DIAGNOSIS — N1831 Chronic kidney disease, stage 3a: Secondary | ICD-10-CM | POA: Diagnosis not present

## 2023-05-26 DIAGNOSIS — N3941 Urge incontinence: Secondary | ICD-10-CM | POA: Diagnosis not present

## 2023-05-26 DIAGNOSIS — G3184 Mild cognitive impairment, so stated: Secondary | ICD-10-CM | POA: Diagnosis not present

## 2023-05-26 DIAGNOSIS — I700 Atherosclerosis of aorta: Secondary | ICD-10-CM | POA: Diagnosis not present

## 2023-05-26 DIAGNOSIS — I129 Hypertensive chronic kidney disease with stage 1 through stage 4 chronic kidney disease, or unspecified chronic kidney disease: Secondary | ICD-10-CM | POA: Diagnosis not present

## 2023-05-26 DIAGNOSIS — Z23 Encounter for immunization: Secondary | ICD-10-CM | POA: Diagnosis not present

## 2023-05-26 DIAGNOSIS — M810 Age-related osteoporosis without current pathological fracture: Secondary | ICD-10-CM | POA: Diagnosis not present

## 2023-05-26 DIAGNOSIS — F3342 Major depressive disorder, recurrent, in full remission: Secondary | ICD-10-CM | POA: Diagnosis not present

## 2023-05-26 DIAGNOSIS — T8484XA Pain due to internal orthopedic prosthetic devices, implants and grafts, initial encounter: Secondary | ICD-10-CM | POA: Diagnosis not present

## 2023-05-26 DIAGNOSIS — D649 Anemia, unspecified: Secondary | ICD-10-CM | POA: Diagnosis not present

## 2023-05-27 DIAGNOSIS — N1831 Chronic kidney disease, stage 3a: Secondary | ICD-10-CM | POA: Diagnosis not present

## 2023-05-27 DIAGNOSIS — D649 Anemia, unspecified: Secondary | ICD-10-CM | POA: Diagnosis not present

## 2023-05-27 DIAGNOSIS — I129 Hypertensive chronic kidney disease with stage 1 through stage 4 chronic kidney disease, or unspecified chronic kidney disease: Secondary | ICD-10-CM | POA: Diagnosis not present

## 2023-06-05 ENCOUNTER — Encounter: Payer: Self-pay | Admitting: Podiatry

## 2023-06-05 ENCOUNTER — Ambulatory Visit (INDEPENDENT_AMBULATORY_CARE_PROVIDER_SITE_OTHER)

## 2023-06-05 ENCOUNTER — Ambulatory Visit: Admitting: Podiatry

## 2023-06-05 DIAGNOSIS — M722 Plantar fascial fibromatosis: Secondary | ICD-10-CM

## 2023-06-05 DIAGNOSIS — M778 Other enthesopathies, not elsewhere classified: Secondary | ICD-10-CM | POA: Diagnosis not present

## 2023-06-05 MED ORDER — TRIAMCINOLONE ACETONIDE 10 MG/ML IJ SUSP
10.0000 mg | Freq: Once | INTRAMUSCULAR | Status: AC
  Administered 2023-06-05: 10 mg

## 2023-06-05 NOTE — Progress Notes (Signed)
 Chief Complaint  Patient presents with   Plantar Fasciitis    Right foot heel pain x 2 months.    HPI: 78 y.o. female presenting today with c/o pain in the bottom of the right heel.  States that pain is significantly worse after periods of rest and after waking up in the morning and taking initial steps.  This has been going on for about 2 months.  She does report that she has dealt with plantar fasciitis many years ago and did have answers from that time when she still uses.  She thinks that they are getting worn out.  Past Medical History:  Diagnosis Date   Allergic rhinoconjunctivitis    Anemia    on meds   Arthritis    on med   Asthma    very mild   Breast cancer (HCC)    RIGHT =s/p XRT and lumpectomy 2009   Cataract    bilateral sx   Depression    on meds   Environmental and seasonal allergies    GERD (gastroesophageal reflux disease)    Heart murmur    slight   Hyperlipidemia    on meds   Hypertension    on meds   Infertility    took clomid with second pregnancy   Knee injury 06/13/2012   fell, cracked femur under knee cap-right   Left-sided weakness    left arm due to polio   Migraine    hx of menstrual   Obesity    Osteoporosis    on meds   Personal history of radiation therapy    Pneumonia    hx of   Polio    age 61 month old-"slight case"   Stomach ulcer    Stroke (HCC)    small stroke per pt per Dr.Shaw - dx 2021   Past Surgical History:  Procedure Laterality Date   BREAST BIOPSY     right breast ER/PR +   BREAST LUMPECTOMY  2009   R. Bartley Borrow    CATARACT EXTRACTION, BILATERAL Bilateral 05/2017, 06/2017   CHOLECYSTECTOMY N/A 04/09/2021   Procedure: LAPAROSCOPIC CHOLECYSTECTOMY WITH ICG DYE;  Surgeon: Aldean Hummingbird, MD;  Location: [facility];  Service: General;  Laterality: N/A;   COLONOSCOPY  2017   KNEE CLOSED REDUCTION Right 11/04/2016   Procedure: CLOSED MANIPULATION RIGHT KNEE;  Surgeon: Liliane Rei, MD;  Location: [facility];  Service:  Orthopedics;  Laterality: Right;   NISSEN FUNDOPLICATION  2005   SKIN CANCER DESTRUCTION     had freezing done to remove pre-cancerpus cells to right cheek    TONSILLECTOMY  78 years old   TOTAL KNEE ARTHROPLASTY Right 11/15/2013   Procedure: RIGHT TOTAL KNEE ARTHROPLASTY;  Surgeon: Aurther Blue, MD;  Location: [facility];  Service: Orthopedics;  Laterality: Right;   TUBAL LIGATION  1976   UPPER GASTROINTESTINAL ENDOSCOPY  2019   recall 3 yrs   WISDOM TOOTH EXTRACTION     Not on File   Physical Exam: General: The patient is alert and oriented x3 in no acute distress.  Dermatology:  No ecchymosis, erythema, or edema bilateral.  No open lesions.    Vascular: Palpable pedal pulses bilaterally. Capillary refill within normal limits.  No appreciable edema.    Neurological: Epicritic sensation is intact  Musculoskeletal Exam:  There is pain on palpation of the plantarmedial & plantarcentral aspect of right heel.  No gaps or nodules within the plantar fascia.  Positive Windlass mechanism bilateral.  Antalgic gait  noted with first few steps upon standing.  No pain on palpation of achilles tendon bilateral.  Ankle df less than 10 degrees with knee extended b/l.  Radiographic Exam: Right foot 3 views weightbearing 06/05/2023 Normal osseous mineralization. Joint spaces preserved.  No fractures noted.  Mild pes planus foot type noted.  Minimal calcaneal spurring noted.  Assessment/Plan of Care: 1. Plantar fasciitis of right foot     Meds ordered this encounter  Medications   triamcinolone  acetonide (KENALOG ) 10 MG/ML injection 10 mg   DG FOOT COMPLETE RIGHT radiographs reviewed with patient  -Reviewed etiology of plantar fasciitis with patient.  Discussed treatment options with patient today, including cortisone injection,  stretching exercises, physical therapy, use of night splint, rest, icing the heel, arch supports/orthotics, and supportive shoe gear.  Avoiding oral NSAIDs due to history of  CKD.  Instructed patient that she can apply over-the-counter Voltaren gel to the affected area massaged in thoroughly 3-4 times a day.  Recommended the use of an over-the-counter night splint for plantar fasciitis  Plantar fascia brace was dispensed today.  This is a prefabricated ankle gauntlet device with elastic strapping to offload and alleviate tension off the plantar fascia and medial arch.  Stretching exercises discussed with patient at length and written instructions dispensed  Powerstep inserts were recommended to the patient as old inserts are worn out.  She may benefit from new custom orthotics once plantar fascia pain improves.  With the patient's verbal consent, a corticosteroid injection was administered to the right heel, consisting of a mixture of 1% lidocaine  plain, 0.5% Sensorcaine  plain, and Kenalog -10 for a total of 2.0 cc administered.  A Band-aid was applied.  Return in about 2 weeks (around 06/19/2023) for Plantar Fasciitis.   Cavon Nicolls L. Lunda Salines, AACFAS [facility]     [address] [place], [place] [address]                Office [phone]  Fax [phone]

## 2023-06-05 NOTE — Patient Instructions (Signed)

## 2023-06-06 ENCOUNTER — Other Ambulatory Visit (HOSPITAL_COMMUNITY): Payer: Self-pay | Admitting: *Deleted

## 2023-06-09 ENCOUNTER — Encounter (HOSPITAL_COMMUNITY): Payer: Self-pay

## 2023-06-09 ENCOUNTER — Encounter (HOSPITAL_COMMUNITY)

## 2023-06-09 ENCOUNTER — Telehealth: Payer: Self-pay

## 2023-06-09 NOTE — Telephone Encounter (Signed)
 Auth Submission: APPROVED Site of care: Site of care: [facility] Payer: [employer] Medication & CPT/J Code(s) submitted: Prolia  (Denosumab ) [id number] Route of submission (phone, fax, portal): fax Phone # Fax # Auth type: Buy/Bill PB Units/visits requested: 60mg  x 1 dose Reference number: [id number] Approval from: 06/06/23 to 12/07/23

## 2023-06-20 ENCOUNTER — Other Ambulatory Visit (HOSPITAL_COMMUNITY): Payer: Self-pay | Admitting: *Deleted

## 2023-06-23 ENCOUNTER — Emergency Department (HOSPITAL_BASED_OUTPATIENT_CLINIC_OR_DEPARTMENT_OTHER): Admitting: Radiology

## 2023-06-23 ENCOUNTER — Encounter (HOSPITAL_BASED_OUTPATIENT_CLINIC_OR_DEPARTMENT_OTHER): Payer: Self-pay

## 2023-06-23 ENCOUNTER — Emergency Department (HOSPITAL_BASED_OUTPATIENT_CLINIC_OR_DEPARTMENT_OTHER)
Admission: EM | Admit: 2023-06-23 | Discharge: 2023-06-23 | Disposition: A | Discharge status: Home / Self Care | Attending: Emergency Medicine | Admitting: Emergency Medicine

## 2023-06-23 ENCOUNTER — Ambulatory Visit (HOSPITAL_COMMUNITY)
Admission: RE | Admit: 2023-06-23 | Discharge: 2023-06-23 | Disposition: A | Discharge status: Home / Self Care | Payer: Self-pay | Source: Ambulatory Visit | Attending: Internal Medicine

## 2023-06-23 ENCOUNTER — Emergency Department (HOSPITAL_BASED_OUTPATIENT_CLINIC_OR_DEPARTMENT_OTHER)

## 2023-06-23 DIAGNOSIS — Z043 Encounter for examination and observation following other accident: Secondary | ICD-10-CM | POA: Diagnosis not present

## 2023-06-23 DIAGNOSIS — S72434A Nondisplaced fracture of medial condyle of right femur, initial encounter for closed fracture: Secondary | ICD-10-CM | POA: Insufficient documentation

## 2023-06-23 DIAGNOSIS — I129 Hypertensive chronic kidney disease with stage 1 through stage 4 chronic kidney disease, or unspecified chronic kidney disease: Secondary | ICD-10-CM | POA: Insufficient documentation

## 2023-06-23 DIAGNOSIS — M9711XA Periprosthetic fracture around internal prosthetic right knee joint, initial encounter: Secondary | ICD-10-CM | POA: Diagnosis not present

## 2023-06-23 DIAGNOSIS — W03XXXA Other fall on same level due to collision with another person, initial encounter: Secondary | ICD-10-CM | POA: Diagnosis not present

## 2023-06-23 DIAGNOSIS — W19XXXA Unspecified fall, initial encounter: Secondary | ICD-10-CM

## 2023-06-23 DIAGNOSIS — S2242XA Multiple fractures of ribs, left side, initial encounter for closed fracture: Secondary | ICD-10-CM | POA: Diagnosis not present

## 2023-06-23 DIAGNOSIS — Z471 Aftercare following joint replacement surgery: Secondary | ICD-10-CM | POA: Diagnosis not present

## 2023-06-23 DIAGNOSIS — M810 Age-related osteoporosis without current pathological fracture: Secondary | ICD-10-CM | POA: Diagnosis not present

## 2023-06-23 DIAGNOSIS — Z7982 Long term (current) use of aspirin: Secondary | ICD-10-CM | POA: Insufficient documentation

## 2023-06-23 DIAGNOSIS — Y9301 Activity, walking, marching and hiking: Secondary | ICD-10-CM | POA: Insufficient documentation

## 2023-06-23 DIAGNOSIS — R55 Syncope and collapse: Secondary | ICD-10-CM | POA: Diagnosis not present

## 2023-06-23 DIAGNOSIS — N1831 Chronic kidney disease, stage 3a: Secondary | ICD-10-CM | POA: Diagnosis not present

## 2023-06-23 DIAGNOSIS — I6523 Occlusion and stenosis of bilateral carotid arteries: Secondary | ICD-10-CM | POA: Diagnosis not present

## 2023-06-23 DIAGNOSIS — R519 Headache, unspecified: Secondary | ICD-10-CM | POA: Diagnosis present

## 2023-06-23 DIAGNOSIS — Z96651 Presence of right artificial knee joint: Secondary | ICD-10-CM | POA: Diagnosis not present

## 2023-06-23 DIAGNOSIS — Z8673 Personal history of transient ischemic attack (TIA), and cerebral infarction without residual deficits: Secondary | ICD-10-CM | POA: Insufficient documentation

## 2023-06-23 DIAGNOSIS — S2241XA Multiple fractures of ribs, right side, initial encounter for closed fracture: Secondary | ICD-10-CM | POA: Diagnosis not present

## 2023-06-23 DIAGNOSIS — S0990XA Unspecified injury of head, initial encounter: Secondary | ICD-10-CM | POA: Diagnosis not present

## 2023-06-23 MED ORDER — LIDOCAINE 5 % EX PTCH: 1.0000 | MEDICATED_PATCH | CUTANEOUS | 0 refills | Status: AC

## 2023-06-23 MED ORDER — DENOSUMAB 60 MG/ML ~~LOC~~ SOSY
60.0000 mg | PREFILLED_SYRINGE | Freq: Once | SUBCUTANEOUS | Status: AC
  Administered 2023-06-23: 60 mg via SUBCUTANEOUS

## 2023-06-23 MED ORDER — TRAMADOL HCL 50 MG PO TABS
50.0000 mg | ORAL_TABLET | Freq: Once | ORAL | Status: AC
  Administered 2023-06-23: 50 mg via ORAL
  Filled 2023-06-23: qty 1

## 2023-06-23 MED ORDER — LIDOCAINE 5 % EX PTCH: 1.0000 | MEDICATED_PATCH | CUTANEOUS | 0 refills | Status: DC

## 2023-06-23 MED ORDER — TRAMADOL HCL 50 MG PO TABS: 50.0000 mg | ORAL_TABLET | Freq: Four times a day (QID) | ORAL | 0 refills | Status: AC | PRN

## 2023-06-23 MED ORDER — DENOSUMAB 60 MG/ML ~~LOC~~ SOSY
PREFILLED_SYRINGE | SUBCUTANEOUS | Status: AC
  Filled 2023-06-23: qty 1

## 2023-06-23 MED ORDER — TRAMADOL HCL 50 MG PO TABS: 50.0000 mg | ORAL_TABLET | Freq: Four times a day (QID) | ORAL | 0 refills | Status: DC | PRN

## 2023-06-23 MED ORDER — LIDOCAINE 5 % EX PTCH
1.0000 | MEDICATED_PATCH | CUTANEOUS | Status: DC
  Administered 2023-06-23: 1 via TRANSDERMAL
  Filled 2023-06-23: qty 1

## 2023-06-23 MED ORDER — ACETAMINOPHEN 325 MG PO TABS
650.0000 mg | ORAL_TABLET | Freq: Once | ORAL | Status: AC
  Administered 2023-06-23: 650 mg via ORAL
  Filled 2023-06-23: qty 2
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
## 2023-06-23 NOTE — Discharge Instructions (Addendum)
 Wear knee immobilizer when weight bearing and walking. WAlk with walker, cane or use wheelchair.  You can put weight on your knee as tolerated.  Please use incentive spirometer as instructed for rib fracture.  For pain control I have sent tramadol  and lidocaine  patches.  You can also alternate ibuprofen and Tylenol .  You can also use ice and heat over area of pain.  Follow-up in 3 weeks with Dr. Bernard Brick, return to ER with new symptoms.

## 2023-06-23 NOTE — ED Provider Notes (Signed)
 [place] EMERGENCY DEPARTMENT AT [facility] Provider Note   CSN: [id number] Arrival date & time: 06/23/23  1440     Patient presents with: No chief complaint on file.   Kelsey Mueller is a 78 y.o. female patient with past medical history of hyperlipidemia, hypertension, history of stroke, history of chronic kidney disease stage IIIa, mild cognitive impairment is presenting to emergency room with complaint of fall.  Patient was walking when someone else fell onto her knocking her back into a table.  The table broke and she hit the back of her head as well.  She does not complain of head pain but has noted a knot to the back of her head.  Denies neck pain, loss of consciousness or blood thinner.  Family reports she is acting at baseline.  Also notes right knee pain, left elbow pain and left shoulder pain.  She can ambulate but notes worsening pain.    HPI     Prior to Admission medications   Medication Sig Start Date End Date Taking? Authorizing Provider  amoxicillin (AMOXIL) 500 MG tablet Take 2,000 mg by mouth See admin instructions. Take 4 tablets (2000 mg) by mouth one hour prior to dental procedures    [provider]  aspirin EC 81 MG tablet Take 81 mg by mouth daily with breakfast. Swallow whole.    [provider]  BIOTIN PO Take 1 tablet by mouth daily with breakfast.    [provider]  cetirizine (ZYRTEC) 10 MG tablet Take 10 mg by mouth daily with breakfast.    [provider]  denosumab  (PROLIA ) 60 MG/ML SOSY injection Inject 60 mg into the skin every 6 (six) months.    [provider]  donepezil (ARICEPT) 10 MG tablet Take 10 mg by mouth at bedtime.    [provider]  DULoxetine (CYMBALTA) 60 MG capsule Take 60 mg by mouth daily with breakfast. 08/08/20   [provider]  ketoconazole (NIZORAL) 2 % cream Apply 1 application. topically daily as needed for irritation (eczema).    [provider]   mirabegron ER (MYRBETRIQ) 25 MG TB24 tablet Take 25 mg by mouth daily with breakfast.    [provider]  Multiple Vitamin (MULTIVITAMIN WITH MINERALS) TABS tablet Take 1 tablet by mouth daily with breakfast.    [provider]  olmesartan (BENICAR) 20 MG tablet Take 20 mg by mouth daily with breakfast. 01/14/20   [provider]  pantoprazole  (PROTONIX ) 40 MG tablet Take 40 mg by mouth 2 (two) times daily with a meal. 01/29/20   [provider]  Probiotic Product (ALIGN PO) Take 1 tablet by mouth daily with breakfast.    [provider]  PSYLLIUM PO Take 1-2 capsules by mouth See admin instructions. Take 2 capsules by mouth with breakfast and lunch, take 2 capsules with supper - as needed for constipation    [provider]  rosuvastatin (CRESTOR) 10 MG tablet Take 10 mg by mouth daily with breakfast. 11/30/20   [provider]  triamcinolone  cream (KENALOG ) 0.1 % Apply 1 application. topically daily. Eczema    [provider]  zolpidem  (AMBIEN ) 10 MG tablet Take 10 mg by mouth at bedtime as needed for sleep. 04/07/09   [provider]    Allergies: Patient has no known allergies.    Review of Systems  Musculoskeletal:  Positive for arthralgias.    Updated Vital Signs BP 127/89 (BP Location: Right Arm)   Pulse  82   Temp 97.6 F (36.4 C)   Resp 18   LMP 01/07/1997   SpO2 98%   Physical Exam Vitals and nursing note reviewed.  Constitutional:      General: She is not in acute distress.    Appearance: She is not toxic-appearing.  HENT:     Head: Normocephalic and atraumatic.   Eyes:     General: No scleral icterus.    Conjunctiva/sclera: Conjunctivae normal.    Cardiovascular:     Rate and Rhythm: Normal rate and regular rhythm.     Pulses: Normal pulses.     Heart sounds: Normal heart sounds.  Pulmonary:     Effort: Pulmonary effort is normal. No respiratory distress.     Breath sounds: Normal  breath sounds.  Abdominal:     General: Abdomen is flat. Bowel sounds are normal.     Palpations: Abdomen is soft.     Tenderness: There is no abdominal tenderness.   Musculoskeletal:     Comments: Tenderness to palpation over medial knee joint.  No obvious deformity or swelling. Tenderness to palpation over lower medial aspect of scapula. No shoulder joint TTP or swelling, normal ROM.   No tenderness to palpation over cervical spine.  No step-off or deformity.   Skin:    General: Skin is warm and dry.     Findings: No lesion.   Neurological:     General: No focal deficit present.     Mental Status: She is alert and oriented to person, place, and time. Mental status is at baseline.     (all labs ordered are listed, but only abnormal results are displayed) Labs Reviewed - No data to display  EKG: None  Radiology: DG Knee 2 Views Right Result Date: 06/23/2023 CLINICAL DATA:  fall EXAM: RIGHT KNEE - 1-2 VIEW COMPARISON:  None Available. FINDINGS: Status post total right knee arthroplasty with acute minimally displaced medial periprosthetic distal femoral condyle fracture. No dislocation. No joint effusion. No evidence of arthropathy or other focal bone abnormality. Soft tissues are unremarkable. IMPRESSION: Status post total right knee arthroplasty with acute minimally displaced medial periprosthetic distal femoral condyle fracture. Electronically Signed   By: Morgane  Naveau M.D.   On: 06/23/2023 17:23   DG Shoulder Left Result Date: 06/23/2023 CLINICAL DATA:  fall EXAM: LEFT SHOULDER - 2+ VIEW COMPARISON:  None Available. FINDINGS: There is no evidence of fracture or dislocation. There is no evidence of arthropathy or other focal bone abnormality. Soft tissues are unremarkable. Question left posterior fifth rib fracture. IMPRESSION: 1. No acute displaced fracture or dislocation of the bones of the shoulder. 2. Question left posterior fifth rib fracture. Electronically Signed   By:  Morgane  Naveau M.D.   On: 06/23/2023 17:21   DG Elbow 2 Views Left Result Date: 06/23/2023 CLINICAL DATA:  fall EXAM: LEFT ELBOW - 2 VIEW COMPARISON:  None Available. FINDINGS: There is no evidence of fracture, dislocation, or joint effusion. There is no evidence of severe arthropathy or other focal bone abnormality. Soft tissues are unremarkable. IMPRESSION: No acute displaced fracture or dislocation. Electronically Signed   By: Morgane  Naveau M.D.   On: 06/23/2023 17:20   CT Head Wo Contrast Result Date: 06/23/2023 CLINICAL DATA:  Polytrauma, blunt Pt was knocked over by someone who passed out. C/o R knee, L elbow/ shoulder pain. EXAM: CT HEAD WITHOUT CONTRAST TECHNIQUE: Contiguous axial images were obtained from the base of the skull through the vertex without intravenous contrast. RADIATION DOSE  REDUCTION: This exam was performed according to the departmental dose-optimization program which includes automated exposure control, adjustment of the mA and/or kV according to patient size and/or use of iterative reconstruction technique. COMPARISON:  MRI head 04/25/2020 FINDINGS: Brain: Cerebral ventricle sizes are concordant with the degree of cerebral volume loss. Patchy and confluent areas of decreased attenuation are noted throughout the deep and periventricular white matter of the cerebral hemispheres bilaterally, compatible with chronic microvascular ischemic disease. No evidence of large-territorial acute infarction. No parenchymal hemorrhage. No mass lesion. No extra-axial collection. No mass effect or midline shift. No hydrocephalus. Basilar cisterns are patent. Vascular: No hyperdense vessel. Atherosclerotic calcifications are present within the cavernous internal carotid arteries. Skull: No acute fracture or focal lesion. Sinuses/Orbits: Paranasal sinuses and mastoid air cells are clear. Bilateral lens replacement. Otherwise the orbits are unremarkable. Other: None. IMPRESSION: No acute  intracranial abnormality. Electronically Signed   By: Morgane  Naveau M.D.   On: 06/23/2023 17:18     Procedures   Medications Ordered in the ED  acetaminophen  (TYLENOL ) tablet 650 mg (650 mg Oral Given 06/23/23 1657)    Clinical Course as of 06/23/23 2047  Mon Jun 23, 2023  1841 3-4wks f/u with Dr Bernard Brick. Knee immobilizer, weight bear as tolerated.  [JB]    Clinical Course User Index [JB] Anisia Leija, Kandace Organ, PA-C                                 Medical Decision Making Amount and/or Complexity of Data Reviewed Radiology: ordered.  Risk OTC drugs. Prescription drug management.   Eliodoro Guerin 78 y.o. presented today for fall. Working DDx that I considered at this time includes, but not limited to, intracranial hemorrhage, subdural/epidural hematoma, vertebral fracture, spinal cord injury, muscle strain, skull fracture, fracture, splenic injury, liver injury, perforated viscus, contusions.  R/o DDx: These diagnoses are less consistent than current impression due to findings on history of present illness, physical exam, labs/imaging findings.   Imaging:  X-ray of right knee shows status post right knee arthroplasty with acute minimally displaced medial periprosthetic distal femoral condyle fracture.  X-ray of left shoulder shows no acute dislocation or fracture but does question posterior fifth rib fracture -- patient dose note comfort care is so suspect this is a fracture -- obtained left rib x-ray with chest to rule out pneumothorax associated with.   X-ray of left elbow is within normal limits.  CT of head is within normal limits.   Problem List / ED Course / Critical interventions / Medication management   Patient reports to emergency room after fall.  She did hit posterior aspect of head.  Someone fell on her causing her to have this fall.  She is not on blood thinners she did not lose consciousness.  She has not had any altered mental status.  She denies any neck pain.  On  my exam she does have some reproducible tenderness to posterior aspect of head.  She has no cervical midline tenderness step-off or deformity.  She has no recent radicular symptoms.  Otherwise moving extremities without any difficulty.  Has left upper back pain over approximately scapula area and 5th and 6th ribs.  X-ray shows possible 5th and 6th posterior rib fracture and no evidence of pneumonia or pneumothorax.  She was given incentive spirometry.  Does note that she has right medial knee pain.  She is neurovascularly intact distally.  X-ray does suggest  a fracture.  Discussed with orthopedics who recommended weightbearing as tolerated and knee immobilizer.  Patient was able to ambulate with steady gait with assistive device and knee immobilizer. I ordered medication including Tylenol   Reevaluation of the patient after these medicines showed that the patient improved Patients vitals assessed. Upon arrival patient is  hemodynamically stable.  I have reviewed the patients home medicines and have made adjustments as needed   Risk Stratification Score: [place] C-spine: Negative   Plan: F/u w/ PCP in 2-3d to ensure resolution of sx.  Patient was given return precautions. Patient stable for discharge at this time.  Patient educated on current sx/dx and verbalized understanding of plan. Return to ER w/ new or worsening sx.        Final diagnoses:  Fall, initial encounter  Closed fracture of multiple ribs of left side, initial encounter  Closed nondisplaced fracture of medial condyle of right femur, initial encounter [facility])    ED Discharge Orders          Ordered    lidocaine  (LIDODERM ) 5 %  Every 24 hours,   Status:  Discontinued        06/23/23 1926    traMADol  (ULTRAM ) 50 MG tablet  Every 6 hours PRN,   Status:  Discontinued        06/23/23 1926    lidocaine  (LIDODERM ) 5 %  Every 24 hours        06/23/23 1936    traMADol  (ULTRAM ) 50 MG tablet  Every 6 hours PRN        06/23/23 1936                Xaiden Fleig, Kandace Organ, PA-C 06/23/23 2049    Sallyanne Creamer, DO 06/24/23 0031

## 2023-06-26 ENCOUNTER — Ambulatory Visit: Admitting: Podiatry

## 2023-06-30 DIAGNOSIS — M810 Age-related osteoporosis without current pathological fracture: Secondary | ICD-10-CM | POA: Diagnosis not present

## 2023-06-30 DIAGNOSIS — S2232XA Fracture of one rib, left side, initial encounter for closed fracture: Secondary | ICD-10-CM | POA: Diagnosis not present

## 2023-06-30 DIAGNOSIS — R053 Chronic cough: Secondary | ICD-10-CM | POA: Diagnosis not present

## 2023-07-08 DIAGNOSIS — D2272 Melanocytic nevi of left lower limb, including hip: Secondary | ICD-10-CM | POA: Diagnosis not present

## 2023-07-08 DIAGNOSIS — L821 Other seborrheic keratosis: Secondary | ICD-10-CM | POA: Diagnosis not present

## 2023-07-08 DIAGNOSIS — D2262 Melanocytic nevi of left upper limb, including shoulder: Secondary | ICD-10-CM | POA: Diagnosis not present

## 2023-07-08 DIAGNOSIS — Z85828 Personal history of other malignant neoplasm of skin: Secondary | ICD-10-CM | POA: Diagnosis not present

## 2023-07-08 DIAGNOSIS — L578 Other skin changes due to chronic exposure to nonionizing radiation: Secondary | ICD-10-CM | POA: Diagnosis not present

## 2023-07-08 DIAGNOSIS — L570 Actinic keratosis: Secondary | ICD-10-CM | POA: Diagnosis not present

## 2023-07-08 DIAGNOSIS — D485 Neoplasm of uncertain behavior of skin: Secondary | ICD-10-CM | POA: Diagnosis not present

## 2023-07-08 DIAGNOSIS — L814 Other melanin hyperpigmentation: Secondary | ICD-10-CM | POA: Diagnosis not present

## 2023-07-15 DIAGNOSIS — M25561 Pain in right knee: Secondary | ICD-10-CM | POA: Diagnosis not present

## 2023-07-15 DIAGNOSIS — M79671 Pain in right foot: Secondary | ICD-10-CM | POA: Diagnosis not present

## 2023-07-18 DIAGNOSIS — M25561 Pain in right knee: Secondary | ICD-10-CM | POA: Diagnosis not present

## 2023-07-18 DIAGNOSIS — Z96651 Presence of right artificial knee joint: Secondary | ICD-10-CM | POA: Diagnosis not present

## 2023-07-18 DIAGNOSIS — S72434A Nondisplaced fracture of medial condyle of right femur, initial encounter for closed fracture: Secondary | ICD-10-CM | POA: Diagnosis not present

## 2023-07-21 DIAGNOSIS — M79671 Pain in right foot: Secondary | ICD-10-CM | POA: Diagnosis not present

## 2023-07-21 DIAGNOSIS — M25561 Pain in right knee: Secondary | ICD-10-CM | POA: Diagnosis not present

## 2023-07-23 DIAGNOSIS — M25561 Pain in right knee: Secondary | ICD-10-CM | POA: Diagnosis not present

## 2023-07-23 DIAGNOSIS — M79671 Pain in right foot: Secondary | ICD-10-CM | POA: Diagnosis not present

## 2023-07-24 DIAGNOSIS — M722 Plantar fascial fibromatosis: Secondary | ICD-10-CM | POA: Diagnosis not present

## 2023-07-29 DIAGNOSIS — M25561 Pain in right knee: Secondary | ICD-10-CM | POA: Diagnosis not present

## 2023-07-29 DIAGNOSIS — M79671 Pain in right foot: Secondary | ICD-10-CM | POA: Diagnosis not present

## 2023-07-31 DIAGNOSIS — M25561 Pain in right knee: Secondary | ICD-10-CM | POA: Diagnosis not present

## 2023-07-31 DIAGNOSIS — M79671 Pain in right foot: Secondary | ICD-10-CM | POA: Diagnosis not present

## 2023-08-04 ENCOUNTER — Other Ambulatory Visit: Payer: Self-pay | Admitting: Family Medicine

## 2023-08-04 DIAGNOSIS — S2242XA Multiple fractures of ribs, left side, initial encounter for closed fracture: Secondary | ICD-10-CM | POA: Diagnosis not present

## 2023-08-04 DIAGNOSIS — R9389 Abnormal findings on diagnostic imaging of other specified body structures: Secondary | ICD-10-CM

## 2023-08-04 DIAGNOSIS — R058 Other specified cough: Secondary | ICD-10-CM | POA: Diagnosis not present

## 2023-08-04 DIAGNOSIS — M810 Age-related osteoporosis without current pathological fracture: Secondary | ICD-10-CM | POA: Diagnosis not present

## 2023-08-05 DIAGNOSIS — M25561 Pain in right knee: Secondary | ICD-10-CM | POA: Diagnosis not present

## 2023-08-05 DIAGNOSIS — M79671 Pain in right foot: Secondary | ICD-10-CM | POA: Diagnosis not present

## 2023-08-06 ENCOUNTER — Ambulatory Visit
Admission: RE | Admit: 2023-08-06 | Discharge: 2023-08-06 | Disposition: A | Discharge status: Home / Self Care | Source: Ambulatory Visit | Attending: Family Medicine | Admitting: Family Medicine

## 2023-08-06 ENCOUNTER — Encounter: Payer: Self-pay | Admitting: Radiology

## 2023-08-06 DIAGNOSIS — W19XXXA Unspecified fall, initial encounter: Secondary | ICD-10-CM | POA: Diagnosis not present

## 2023-08-06 DIAGNOSIS — S2249XA Multiple fractures of ribs, unspecified side, initial encounter for closed fracture: Secondary | ICD-10-CM | POA: Diagnosis not present

## 2023-08-06 DIAGNOSIS — R9389 Abnormal findings on diagnostic imaging of other specified body structures: Secondary | ICD-10-CM

## 2023-08-06 MED ORDER — IOPAMIDOL (ISOVUE-300) INJECTION 61%
75.0000 mL | Freq: Once | INTRAVENOUS | Status: AC | PRN
  Administered 2023-08-06: 75 mL via INTRAVENOUS

## 2023-08-07 DIAGNOSIS — M79671 Pain in right foot: Secondary | ICD-10-CM | POA: Diagnosis not present

## 2023-08-07 DIAGNOSIS — M25561 Pain in right knee: Secondary | ICD-10-CM | POA: Diagnosis not present

## 2023-08-12 DIAGNOSIS — M25561 Pain in right knee: Secondary | ICD-10-CM | POA: Diagnosis not present

## 2023-08-12 DIAGNOSIS — M79671 Pain in right foot: Secondary | ICD-10-CM | POA: Diagnosis not present

## 2023-08-15 DIAGNOSIS — Z96651 Presence of right artificial knee joint: Secondary | ICD-10-CM | POA: Diagnosis not present

## 2023-08-15 DIAGNOSIS — H5213 Myopia, bilateral: Secondary | ICD-10-CM | POA: Diagnosis not present

## 2023-08-15 DIAGNOSIS — H04123 Dry eye syndrome of bilateral lacrimal glands: Secondary | ICD-10-CM | POA: Diagnosis not present

## 2023-08-15 DIAGNOSIS — S72434A Nondisplaced fracture of medial condyle of right femur, initial encounter for closed fracture: Secondary | ICD-10-CM | POA: Diagnosis not present

## 2023-08-15 DIAGNOSIS — H524 Presbyopia: Secondary | ICD-10-CM | POA: Diagnosis not present

## 2023-08-15 DIAGNOSIS — H43813 Vitreous degeneration, bilateral: Secondary | ICD-10-CM | POA: Diagnosis not present

## 2023-08-15 DIAGNOSIS — H353112 Nonexudative age-related macular degeneration, right eye, intermediate dry stage: Secondary | ICD-10-CM | POA: Diagnosis not present

## 2023-08-15 DIAGNOSIS — H26491 Other secondary cataract, right eye: Secondary | ICD-10-CM | POA: Diagnosis not present

## 2023-08-19 DIAGNOSIS — M25561 Pain in right knee: Secondary | ICD-10-CM | POA: Diagnosis not present

## 2023-08-19 DIAGNOSIS — M79671 Pain in right foot: Secondary | ICD-10-CM | POA: Diagnosis not present

## 2023-08-25 ENCOUNTER — Other Ambulatory Visit: Payer: Self-pay | Admitting: Internal Medicine

## 2023-08-25 DIAGNOSIS — Z1231 Encounter for screening mammogram for malignant neoplasm of breast: Secondary | ICD-10-CM

## 2023-08-28 DIAGNOSIS — M25561 Pain in right knee: Secondary | ICD-10-CM | POA: Diagnosis not present

## 2023-08-28 DIAGNOSIS — M79671 Pain in right foot: Secondary | ICD-10-CM | POA: Diagnosis not present

## 2023-09-02 ENCOUNTER — Ambulatory Visit

## 2023-09-02 DIAGNOSIS — M79671 Pain in right foot: Secondary | ICD-10-CM | POA: Diagnosis not present

## 2023-09-02 DIAGNOSIS — M25561 Pain in right knee: Secondary | ICD-10-CM | POA: Diagnosis not present

## 2023-09-04 ENCOUNTER — Ambulatory Visit
Admission: RE | Admit: 2023-09-04 | Discharge: 2023-09-04 | Disposition: A | Discharge status: Home / Self Care | Source: Ambulatory Visit | Attending: Internal Medicine | Admitting: Internal Medicine

## 2023-09-04 DIAGNOSIS — Z1231 Encounter for screening mammogram for malignant neoplasm of breast: Secondary | ICD-10-CM | POA: Diagnosis not present

## 2023-09-04 DIAGNOSIS — M79671 Pain in right foot: Secondary | ICD-10-CM | POA: Diagnosis not present

## 2023-09-04 DIAGNOSIS — M25561 Pain in right knee: Secondary | ICD-10-CM | POA: Diagnosis not present

## 2023-09-09 DIAGNOSIS — M79671 Pain in right foot: Secondary | ICD-10-CM | POA: Diagnosis not present

## 2023-09-09 DIAGNOSIS — M25561 Pain in right knee: Secondary | ICD-10-CM | POA: Diagnosis not present

## 2023-09-11 DIAGNOSIS — L988 Other specified disorders of the skin and subcutaneous tissue: Secondary | ICD-10-CM | POA: Diagnosis not present

## 2023-09-11 DIAGNOSIS — M79671 Pain in right foot: Secondary | ICD-10-CM | POA: Diagnosis not present

## 2023-09-11 DIAGNOSIS — D485 Neoplasm of uncertain behavior of skin: Secondary | ICD-10-CM | POA: Diagnosis not present

## 2023-09-11 DIAGNOSIS — M25561 Pain in right knee: Secondary | ICD-10-CM | POA: Diagnosis not present

## 2023-09-18 DIAGNOSIS — M79671 Pain in right foot: Secondary | ICD-10-CM | POA: Diagnosis not present

## 2023-09-18 DIAGNOSIS — M25561 Pain in right knee: Secondary | ICD-10-CM | POA: Diagnosis not present

## 2023-09-23 DIAGNOSIS — M25561 Pain in right knee: Secondary | ICD-10-CM | POA: Diagnosis not present

## 2023-09-23 DIAGNOSIS — M79671 Pain in right foot: Secondary | ICD-10-CM | POA: Diagnosis not present

## 2023-09-25 DIAGNOSIS — M79671 Pain in right foot: Secondary | ICD-10-CM | POA: Diagnosis not present

## 2023-09-25 DIAGNOSIS — M25561 Pain in right knee: Secondary | ICD-10-CM | POA: Diagnosis not present

## 2023-09-30 DIAGNOSIS — M79671 Pain in right foot: Secondary | ICD-10-CM | POA: Diagnosis not present

## 2023-09-30 DIAGNOSIS — M25561 Pain in right knee: Secondary | ICD-10-CM | POA: Diagnosis not present

## 2023-10-07 DIAGNOSIS — M25561 Pain in right knee: Secondary | ICD-10-CM | POA: Diagnosis not present

## 2023-10-07 DIAGNOSIS — M79671 Pain in right foot: Secondary | ICD-10-CM | POA: Diagnosis not present

## 2023-10-09 DIAGNOSIS — M25561 Pain in right knee: Secondary | ICD-10-CM | POA: Diagnosis not present

## 2023-10-09 DIAGNOSIS — M79671 Pain in right foot: Secondary | ICD-10-CM | POA: Diagnosis not present

## 2023-10-14 DIAGNOSIS — M79671 Pain in right foot: Secondary | ICD-10-CM | POA: Diagnosis not present

## 2023-10-14 DIAGNOSIS — M25561 Pain in right knee: Secondary | ICD-10-CM | POA: Diagnosis not present

## 2023-10-16 DIAGNOSIS — M25561 Pain in right knee: Secondary | ICD-10-CM | POA: Diagnosis not present

## 2023-10-16 DIAGNOSIS — M79671 Pain in right foot: Secondary | ICD-10-CM | POA: Diagnosis not present

## 2023-10-21 DIAGNOSIS — M79671 Pain in right foot: Secondary | ICD-10-CM | POA: Diagnosis not present

## 2023-10-21 DIAGNOSIS — M25561 Pain in right knee: Secondary | ICD-10-CM | POA: Diagnosis not present

## 2023-10-23 DIAGNOSIS — M25561 Pain in right knee: Secondary | ICD-10-CM | POA: Diagnosis not present

## 2023-10-23 DIAGNOSIS — M79671 Pain in right foot: Secondary | ICD-10-CM | POA: Diagnosis not present

## 2023-11-17 DIAGNOSIS — D649 Anemia, unspecified: Secondary | ICD-10-CM | POA: Diagnosis not present

## 2023-11-17 DIAGNOSIS — E785 Hyperlipidemia, unspecified: Secondary | ICD-10-CM | POA: Diagnosis not present

## 2023-11-24 DIAGNOSIS — N3941 Urge incontinence: Secondary | ICD-10-CM | POA: Diagnosis not present

## 2023-11-24 DIAGNOSIS — E785 Hyperlipidemia, unspecified: Secondary | ICD-10-CM | POA: Diagnosis not present

## 2023-11-24 DIAGNOSIS — M545 Low back pain, unspecified: Secondary | ICD-10-CM | POA: Diagnosis not present

## 2023-11-24 DIAGNOSIS — F3342 Major depressive disorder, recurrent, in full remission: Secondary | ICD-10-CM | POA: Diagnosis not present

## 2023-11-24 DIAGNOSIS — F02A0 Dementia in other diseases classified elsewhere, mild, without behavioral disturbance, psychotic disturbance, mood disturbance, and anxiety: Secondary | ICD-10-CM | POA: Diagnosis not present

## 2023-11-24 DIAGNOSIS — R82998 Other abnormal findings in urine: Secondary | ICD-10-CM | POA: Diagnosis not present

## 2023-11-24 DIAGNOSIS — Z1339 Encounter for screening examination for other mental health and behavioral disorders: Secondary | ICD-10-CM | POA: Diagnosis not present

## 2023-11-24 DIAGNOSIS — M810 Age-related osteoporosis without current pathological fracture: Secondary | ICD-10-CM | POA: Diagnosis not present

## 2023-11-24 DIAGNOSIS — I129 Hypertensive chronic kidney disease with stage 1 through stage 4 chronic kidney disease, or unspecified chronic kidney disease: Secondary | ICD-10-CM | POA: Diagnosis not present

## 2023-11-24 DIAGNOSIS — G301 Alzheimer's disease with late onset: Secondary | ICD-10-CM | POA: Diagnosis not present

## 2023-11-24 DIAGNOSIS — T8484XA Pain due to internal orthopedic prosthetic devices, implants and grafts, initial encounter: Secondary | ICD-10-CM | POA: Diagnosis not present

## 2023-11-24 DIAGNOSIS — N1831 Chronic kidney disease, stage 3a: Secondary | ICD-10-CM | POA: Diagnosis not present

## 2023-11-24 DIAGNOSIS — Z0000 Encounter for general adult medical examination without abnormal findings: Secondary | ICD-10-CM | POA: Diagnosis not present

## 2024-01-07 ENCOUNTER — Other Ambulatory Visit (HOSPITAL_COMMUNITY): Payer: Self-pay | Admitting: Internal Medicine

## 2024-01-07 NOTE — Progress Notes (Signed)
 Prolia  referral Last dose 06/20/2023  Calcium on 11/21/2023 wnl  Sherry Pennant, PharmD, MPH, BCPS, CPP Clinical Pharmacist

## 2024-01-15 ENCOUNTER — Telehealth (HOSPITAL_COMMUNITY): Payer: Self-pay

## 2024-01-15 NOTE — Telephone Encounter (Signed)
 Auth Submission: NO AUTH NEEDED Site of care: Site of care: [facility] Payer: [employer] Medication & CPT/J Code(s) submitted: Prolia  (Denosumab ) [id number] Diagnosis Code: M81.0 Route of submission (phone, fax, portal):  Phone # Fax # Auth type: Buy/Bill HB Units/visits requested: 60mg  q44months Reference number:  Approval from: 01/15/24 to 01/06/25

## 2024-01-16 ENCOUNTER — Encounter (HOSPITAL_COMMUNITY): Payer: Self-pay | Admitting: Internal Medicine

## 2024-01-21 ENCOUNTER — Ambulatory Visit (HOSPITAL_COMMUNITY)
Admission: RE | Admit: 2024-01-21 | Discharge: 2024-01-21 | Disposition: A | Discharge status: Home / Self Care | Source: Ambulatory Visit | Attending: Internal Medicine | Admitting: Internal Medicine

## 2024-01-21 VITALS — BP 163/81 | HR 62 | Temp 97.4°F

## 2024-01-21 DIAGNOSIS — M810 Age-related osteoporosis without current pathological fracture: Secondary | ICD-10-CM | POA: Diagnosis present

## 2024-01-21 MED ORDER — DENOSUMAB 60 MG/ML ~~LOC~~ SOSY
60.0000 mg | PREFILLED_SYRINGE | Freq: Once | SUBCUTANEOUS | Status: AC
  Administered 2024-01-21: 60 mg via SUBCUTANEOUS

## 2024-01-21 MED ORDER — DENOSUMAB 60 MG/ML ~~LOC~~ SOSY
PREFILLED_SYRINGE | SUBCUTANEOUS | Status: AC
  Filled 2024-01-21: qty 1

## 2024-07-21 ENCOUNTER — Encounter (HOSPITAL_COMMUNITY)
